# Patient Record
Sex: Female | Born: 1965 | Race: White | Hispanic: No | Smoking: Never smoker
Health system: Southern US, Community
[De-identification: ages and names within clinical notes are randomized; demographics above are authoritative.]

## PROBLEM LIST (undated history)

## (undated) DIAGNOSIS — R000 Tachycardia, unspecified: Secondary | ICD-10-CM

## (undated) DIAGNOSIS — R011 Cardiac murmur, unspecified: Secondary | ICD-10-CM

## (undated) DIAGNOSIS — S322XXA Fracture of coccyx, initial encounter for closed fracture: Secondary | ICD-10-CM

## (undated) DIAGNOSIS — C50511 Malignant neoplasm of lower-outer quadrant of right female breast: Secondary | ICD-10-CM

## (undated) DIAGNOSIS — S3210XA Unspecified fracture of sacrum, initial encounter for closed fracture: Secondary | ICD-10-CM

## (undated) HISTORY — PX: TUBAL LIGATION: SHX77

## (undated) HISTORY — PX: TONSILECTOMY/ADENOIDECTOMY WITH MYRINGOTOMY: SHX6125

## (undated) HISTORY — DX: Fracture of coccyx, initial encounter for closed fracture: S32.2XXA

## (undated) HISTORY — DX: Tachycardia, unspecified: R00.0

## (undated) HISTORY — DX: Cardiac murmur, unspecified: R01.1

## (undated) HISTORY — PX: APPENDECTOMY: SHX54

## (undated) HISTORY — DX: Malignant neoplasm of lower-outer quadrant of right female breast: C50.511

## (undated) HISTORY — PX: MASTECTOMY: SHX3

## (undated) HISTORY — DX: Unspecified fracture of sacrum, initial encounter for closed fracture: S32.10XA

---
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
## 1998-05-28 ENCOUNTER — Emergency Department (HOSPITAL_COMMUNITY): Admission: EM | Admit: 1998-05-28 | Discharge: 1998-05-28 | Payer: Self-pay | Admitting: Emergency Medicine

## 1999-12-02 ENCOUNTER — Emergency Department (HOSPITAL_COMMUNITY): Admission: EM | Admit: 1999-12-02 | Discharge: 1999-12-02 | Payer: Self-pay | Admitting: Emergency Medicine

## 1999-12-02 ENCOUNTER — Encounter: Payer: Self-pay | Admitting: Emergency Medicine

## 2002-04-11 ENCOUNTER — Encounter: Payer: Self-pay | Admitting: Emergency Medicine

## 2002-04-11 ENCOUNTER — Emergency Department (HOSPITAL_COMMUNITY): Admission: EM | Admit: 2002-04-11 | Discharge: 2002-04-11 | Payer: Self-pay | Admitting: Emergency Medicine

## 2004-10-26 ENCOUNTER — Emergency Department (HOSPITAL_COMMUNITY): Admission: EM | Admit: 2004-10-26 | Discharge: 2004-10-26 | Payer: Self-pay | Admitting: Family Medicine

## 2004-11-27 ENCOUNTER — Emergency Department (HOSPITAL_COMMUNITY): Admission: EM | Admit: 2004-11-27 | Discharge: 2004-11-27 | Payer: Self-pay | Admitting: Family Medicine

## 2006-01-16 ENCOUNTER — Emergency Department (HOSPITAL_COMMUNITY): Admission: EM | Admit: 2006-01-16 | Discharge: 2006-01-16 | Payer: Self-pay | Admitting: Emergency Medicine

## 2007-04-25 ENCOUNTER — Emergency Department (HOSPITAL_COMMUNITY): Admission: EM | Admit: 2007-04-25 | Discharge: 2007-04-25 | Payer: Self-pay | Admitting: Family Medicine

## 2007-08-11 ENCOUNTER — Emergency Department (HOSPITAL_COMMUNITY): Admission: EM | Admit: 2007-08-11 | Discharge: 2007-08-11 | Payer: Self-pay | Admitting: Emergency Medicine

## 2008-07-28 ENCOUNTER — Emergency Department (HOSPITAL_COMMUNITY): Admission: EM | Admit: 2008-07-28 | Discharge: 2008-07-28 | Payer: Self-pay | Admitting: Family Medicine

## 2010-11-15 ENCOUNTER — Emergency Department (HOSPITAL_COMMUNITY)
Admission: EM | Admit: 2010-11-15 | Discharge: 2010-11-15 | Payer: Self-pay | Source: Home / Self Care | Admitting: Family Medicine

## 2010-12-18 ENCOUNTER — Emergency Department (HOSPITAL_COMMUNITY)
Admission: EM | Admit: 2010-12-18 | Discharge: 2010-12-18 | Payer: Self-pay | Source: Home / Self Care | Admitting: Family Medicine

## 2011-06-13 ENCOUNTER — Encounter: Payer: Self-pay | Admitting: Family Medicine

## 2011-06-13 ENCOUNTER — Ambulatory Visit (INDEPENDENT_AMBULATORY_CARE_PROVIDER_SITE_OTHER): Payer: 59 | Admitting: Family Medicine

## 2011-06-13 VITALS — BP 130/92 | HR 95 | Temp 99.1°F | Ht 66.0 in | Wt 165.8 lb

## 2011-06-13 DIAGNOSIS — M76899 Other specified enthesopathies of unspecified lower limb, excluding foot: Secondary | ICD-10-CM

## 2011-06-13 DIAGNOSIS — G5700 Lesion of sciatic nerve, unspecified lower limb: Secondary | ICD-10-CM

## 2011-06-13 DIAGNOSIS — M6580 Other synovitis and tenosynovitis, unspecified site: Secondary | ICD-10-CM

## 2011-06-13 NOTE — Progress Notes (Signed)
April Orozco, a 45 y.o. 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 2 children at home. Was married for 20 years.  Murmur - palipitations.   Crushed tailbone at 18 Now with posterior buttocks pain mostly all 1 sided.  Pain in buttocks, some radiation down leg  Mild degree of L anterior hip pain at well, but none in the deep groin  No trauma  Patient Active Problem List  Diagnoses  . Fracture, sacrum/coccyx   Past Medical History  Diagnosis Date  . Heart murmur     SE Cardiovascular has evaluated, felt benign  . Tachycardia     Intermittent, resolved with decreased caffeine intake  . Fracture, sacrum/coccyx 1970    "Shattered tailbone"   Past Surgical History  Procedure Date  . Tubal ligation    History  Substance Use Topics  . Smoking status: Never Smoker   . Smokeless tobacco: Not on file  . Alcohol Use: Yes   No family history on file. Allergies  Allergen Reactions  . Banana    No current outpatient prescriptions on file prior to visit.   REVIEW OF SYSTEMS  GEN: No fevers, chills. Nontoxic. Primarily MSK c/o today. MSK: Detailed in the HPI GI: tolerating PO intake without difficulty Neuro: detailed above Otherwise the pertinent positives of the ROS are noted above.    Physical Exam  Blood pressure 130/92, pulse 95, temperature 99.1 F (37.3 C), temperature source Oral, height 5\' 6"  (1.676 m), weight 165 lb 12.8 oz (75.206 kg), SpO2 98.00%.  GEN: Well-developed,well-nourished,in no acute distress; alert,appropriate and cooperative throughout examination HEENT: Normocephalic and atraumatic without obvious abnormalities. Ears, externally no deformities PULM: Breathing comfortably in no respiratory distress EXT: No clubbing, cyanosis, or edema PSYCH: Normally interactive. Cooperative during the interview. Pleasant. Friendly  and conversant. Not anxious or depressed appearing. Normal, full affect.  HIP EXAM: SIDE: ROM: Abduction, Flexion, Internal and External range of motion: excellent Pain with terminal IROM and EROM: no GTB: NT SLR: NEG Knees: No effusion FABER: NT REVERSE FABER: TTP on L Piriformis: TTP at direct palpation Str: flexion: 5/5 abduction: 4+/5 adduction: 5/5 Strength testing,some pain with abductin  TTP anterior hip flexor insertion  Assessment and Plan: 1.  Hip flexor insertional tendinopathy. 2. Classic piriformis syndrome  Using Netter's Atlas of Orthopaedic Anatomy, I reviewed the anatomy associated with the patient's problem. Reviewed pathophysiology. The patient was given a handout from [facility] for rehabilitation of the following condition: Piriformis Syndrome and hip rehab  Also given a handout with more extensive Piriformis stretching, hip flexor and abductor strengthening, ham stretching Exam not c/w with spine  Rec deep massage, explained self-massage with ball

## 2011-06-13 NOTE — Patient Instructions (Addendum)
Sacroiliac Joint Mobilization and Rehab 1. Work on pretzel stretching, shoulder back and leg draped in front. 3-5 sets, 30 sec.. 2. hip abductor rotations. standing, hip flexion and rotation outward then inward. 3 sets, 15 reps. when can do comfortably, add ankle weights starting at 2 pounds.  3. cross over stretching - shoulder back to ground, same side leg crossover. 3-5 sets for 30 min..  4. rolling up and back knees to chest and rocking. Sink stretches during the day  Hip workout from [facility]  Balance exercises for hip  Particularly Hip abduction, adduction, flexion 2-3 sets of 30 each day   F/u Nov for CPX

## 2011-06-16 ENCOUNTER — Encounter: Payer: Self-pay | Admitting: Family Medicine

## 2011-06-16 DIAGNOSIS — S3210XA Unspecified fracture of sacrum, initial encounter for closed fracture: Secondary | ICD-10-CM | POA: Insufficient documentation

## 2011-10-10 ENCOUNTER — Other Ambulatory Visit: Payer: 59

## 2011-10-15 ENCOUNTER — Encounter: Payer: 59 | Admitting: Family Medicine

## 2011-10-15 DIAGNOSIS — Z0289 Encounter for other administrative examinations: Secondary | ICD-10-CM

## 2014-09-01 ENCOUNTER — Ambulatory Visit (INDEPENDENT_AMBULATORY_CARE_PROVIDER_SITE_OTHER): Payer: BC Managed Care – PPO | Admitting: Family Medicine

## 2014-09-01 ENCOUNTER — Ambulatory Visit (INDEPENDENT_AMBULATORY_CARE_PROVIDER_SITE_OTHER): Payer: BC Managed Care – PPO

## 2014-09-01 VITALS — BP 132/90 | HR 84 | Temp 98.2°F | Resp 18 | Ht 64.0 in | Wt 179.2 lb

## 2014-09-01 DIAGNOSIS — J209 Acute bronchitis, unspecified: Secondary | ICD-10-CM

## 2014-09-01 DIAGNOSIS — R062 Wheezing: Secondary | ICD-10-CM

## 2014-09-01 DIAGNOSIS — R059 Cough, unspecified: Secondary | ICD-10-CM

## 2014-09-01 DIAGNOSIS — R05 Cough: Secondary | ICD-10-CM

## 2014-09-01 DIAGNOSIS — J018 Other acute sinusitis: Secondary | ICD-10-CM

## 2014-09-01 MED ORDER — HYDROCODONE-HOMATROPINE 5-1.5 MG/5ML PO SYRP: 5.0000 mL | ORAL_SOLUTION | Freq: Every evening | ORAL | Status: DC | PRN

## 2014-09-01 MED ORDER — IPRATROPIUM BROMIDE 0.02 % IN SOLN
0.5000 mg | Freq: Once | RESPIRATORY_TRACT | Status: AC
  Administered 2014-09-01: 0.5 mg via RESPIRATORY_TRACT

## 2014-09-01 MED ORDER — ALBUTEROL SULFATE (2.5 MG/3ML) 0.083% IN NEBU
2.5000 mg | INHALATION_SOLUTION | Freq: Once | RESPIRATORY_TRACT | Status: AC
  Administered 2014-09-01: 2.5 mg via RESPIRATORY_TRACT

## 2014-09-01 MED ORDER — ALBUTEROL SULFATE HFA 108 (90 BASE) MCG/ACT IN AERS: 2.0000 | INHALATION_SPRAY | Freq: Four times a day (QID) | RESPIRATORY_TRACT | Status: DC | PRN

## 2014-09-01 MED ORDER — AZITHROMYCIN 250 MG PO TABS: ORAL_TABLET | ORAL | Status: DC

## 2014-09-01 MED ORDER — BENZONATATE 100 MG PO CAPS: 200.0000 mg | ORAL_CAPSULE | Freq: Two times a day (BID) | ORAL | Status: DC | PRN

## 2014-09-01 NOTE — Patient Instructions (Signed)

## 2014-09-01 NOTE — Progress Notes (Signed)
Chief Complaint:  Chief Complaint  Patient presents with  . Flank Pain    since last Friday--left side hurt--hard to breathe  . Cough    productive--yellowish/green  . Nasal Congestion  . Fever    earlier at 101    HPI: April Orozco is a 48 y.o. female who is here for sinus xs and nasal congestion x 7 days. She is an occupational Programmer, applications and starting last week she gave about 500  Flu shots and must have been on contact with some sick person, she did not get one for herself ye. She has had fevers, productive yellowish green cough, nasal congestion, wheezing, and has been taking otc meds without relief. 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  [place] [employer], she does occupational health, she used to work for [employer] in the [place]  Past Medical History  Diagnosis Date  . Heart murmur     SE Cardiovascular has evaluated, felt benign  . Tachycardia     Intermittent, resolved with decreased caffeine intake  . Fracture, sacrum/coccyx 1970    "Shattered tailbone"   Past Surgical History  Procedure Laterality Date  . Tubal ligation     History   Social History  . Marital Status: Widowed    Spouse Name: N/A    Number of Children: 7  . Years of Education: N/A   Occupational History  . 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 Smoking status: Never Smoker   . Smokeless tobacco: None  . Alcohol Use: Yes  . Drug Use: None  . Sexual Activity: None   Other Topics Concern  . None   Social History Narrative  . None   History reviewed. No pertinent family history. Allergies  Allergen Reactions  . Banana   . Sulfur Hives   Prior to Admission medications   Not on File     ROS: The patient denies night sweats, unintentional weight loss, chest pain, palpitations,  nausea, vomiting, abdominal pain, dysuria,  hematuria, melena, numbness, weakness, or tingling.   All other systems have been reviewed and were otherwise negative with the exception of those mentioned in the HPI and as above.    PHYSICAL EXAM: Filed Vitals:   09/01/14 1342  BP: 132/90  Pulse: 84  Temp: 98.2 F (36.8 C)  Resp: 18  spo2 97% Filed Vitals:   09/01/14 1342  Height: 5\' 4"  (1.626 m)  Weight: 179 lb 3.2 oz (81.285 kg)   Body mass index is 30.74 kg/(m^2).  General: Alert, no acute distress HEENT:  Normocephalic, atraumatic, oropharynx patent. EOMI, PERRLA, + nasal dc , erythematous boggy nares, +/- sinus pressure Cardiovascular:  Regular rate and rhythm, no rubs murmurs or gallops.  No Carotid bruits, radial pulse intact. No pedal edema.  Respiratory: + No wheezes, No rales, or rhonchi.  No cyanosis, no use of accessory musculature GI: No organomegaly, abdomen is soft and non-tender, positive bowel sounds.  No masses. Skin: No rashes. Neurologic: Facial musculature symmetric. Psychiatric: Patient is appropriate throughout our interaction. Lymphatic: No cervical lymphadenopathy Musculoskeletal: Gait intact.   LABS: No results found for this or any previous visit.   EKG/XRAY:   Primary read interpreted by Dr. Marin Comment at [facility].s + bronchitic change No pneumo, effusion, obvious  iinfiltrates   ASSESSMENT/PLAN: Encounter Diagnoses  Name Primary?  . Cough   . Wheezing   . Other acute sinusitis   . Acute bronchitis, unspecified organism Yes   Felt better after and improved airflow , So2 98%  After nebs x 1 Z pack, albuterol, tessalon and hycodan F/u prn  Gross sideeffects, risk and benefits, and alternatives of medications d/w patient. Patient is aware that all medications have potential sideeffects and we are unable to predict every sideeffect or drug-drug interaction that may occur.  Quandra Fedorchak, [place], DO 09/01/2014 3:08 PM

## 2015-08-25 ENCOUNTER — Emergency Department (INDEPENDENT_AMBULATORY_CARE_PROVIDER_SITE_OTHER)
Admission: EM | Admit: 2015-08-25 | Discharge: 2015-08-25 | Disposition: A | Discharge status: Home / Self Care | Payer: BLUE CROSS/BLUE SHIELD | Source: Home / Self Care | Attending: Family Medicine | Admitting: Family Medicine

## 2015-08-25 ENCOUNTER — Encounter (HOSPITAL_COMMUNITY): Payer: Self-pay

## 2015-08-25 ENCOUNTER — Other Ambulatory Visit (HOSPITAL_COMMUNITY)
Admission: RE | Admit: 2015-08-25 | Discharge: 2015-08-25 | Disposition: A | Discharge status: Home / Self Care | Payer: BLUE CROSS/BLUE SHIELD | Source: Ambulatory Visit | Attending: Family Medicine | Admitting: Family Medicine

## 2015-08-25 DIAGNOSIS — R109 Unspecified abdominal pain: Secondary | ICD-10-CM

## 2015-08-25 LAB — URINALYSIS, ROUTINE W REFLEX MICROSCOPIC
BILIRUBIN URINE: NEGATIVE
Glucose, UA: NEGATIVE mg/dL
HGB URINE DIPSTICK: NEGATIVE
KETONES UR: NEGATIVE mg/dL
Leukocytes, UA: NEGATIVE
NITRITE: NEGATIVE
PH: 5 (ref 5.0–8.0)
Protein, ur: NEGATIVE mg/dL
Specific Gravity, Urine: 1.006 (ref 1.005–1.030)
UROBILINOGEN UA: 0.2 mg/dL (ref 0.0–1.0)

## 2015-08-25 LAB — POCT URINALYSIS DIP (DEVICE)
BILIRUBIN URINE: NEGATIVE
Glucose, UA: NEGATIVE mg/dL
Ketones, ur: NEGATIVE mg/dL
LEUKOCYTES UA: NEGATIVE
NITRITE: NEGATIVE
Protein, ur: NEGATIVE mg/dL
Specific Gravity, Urine: <=1.005 (ref 1.005–1.030)
Urobilinogen, UA: 0.2 mg/dL (ref 0.0–1.0)
pH: 5.5 (ref 5.0–8.0)

## 2015-08-25 LAB — POCT PREGNANCY, URINE: PREG TEST UR: NEGATIVE

## 2015-08-25 MED ORDER — KETOROLAC TROMETHAMINE 60 MG/2ML IM SOLN
INTRAMUSCULAR | Status: AC
  Filled 2015-08-25: qty 2

## 2015-08-25 MED ORDER — TAMSULOSIN HCL 0.4 MG PO CAPS: 0.4000 mg | ORAL_CAPSULE | Freq: Every day | ORAL | Status: DC

## 2015-08-25 MED ORDER — KETOROLAC TROMETHAMINE 60 MG/2ML IM SOLN
60.0000 mg | Freq: Once | INTRAMUSCULAR | Status: AC
  Administered 2015-08-25: 60 mg via INTRAMUSCULAR

## 2015-08-25 MED ORDER — TRAMADOL HCL 50 MG PO TABS: 50.0000 mg | ORAL_TABLET | Freq: Four times a day (QID) | ORAL | Status: DC | PRN

## 2015-08-25 NOTE — ED Notes (Signed)
C/o pain left lower pelvic area x couple of days. Menses 4th day, gradually heavier past few yrs. Pain kept her from sleeping pain comes and goes, stabbing type. No pain w percussion CVJ, NAD

## 2015-08-25 NOTE — Discharge Instructions (Signed)
The cause of your flank pain does not be clear but likely due to a kidney stone versus some other GYN etiology for constipation. Please remember to start using her Colace or other agent to help improve her bowel movements. Your given a dose of Toradol in our clinic to help with inflammation and pain. He may restart NSAIDs at home after 24 hours. Please start using the Flomax to help open the ureter and urethra's canal past the stone. He may use the tramadol for additional pain relief. Please go to the emergency room if you get significantly worse.

## 2015-08-25 NOTE — ED Notes (Signed)
Sent home w urine strainer. Call back number for lab issues verified

## 2015-08-25 NOTE — ED Provider Notes (Signed)
CSN: [id number]     Arrival date & time 08/25/15  1436 History   First MD Initiated Contact with Patient 08/25/15 1511     Chief Complaint  Patient presents with  . Pelvic Pain   (Consider location/radiation/quality/duration/timing/severity/associated sxs/prior Treatment) HPI   L mid abdominal pain. 3 days. Sharp. Comes adn goes. Episodes last a few minutes. Pain kept pt up all through the night. Non-radiating. Tylenol and ibuprofen w/o benefit. Heating pad w/ minimal improvement. No change in oral intake.  Denies fevers, nausea, vomiting, lower pelvic pain, vaginal discharge, rash, dysuria, frequency. Still on menstrual period.  QOD BMs  Past Medical History  Diagnosis Date  . Heart murmur     SE Cardiovascular has evaluated, felt benign  . Tachycardia     Intermittent, resolved with decreased caffeine intake  . Fracture, sacrum/coccyx 1970    "Shattered tailbone"   Past Surgical History  Procedure Laterality Date  . Tubal ligation     Family History  Problem Relation Age of Onset  . Diabetes Mother   . Diabetes Father   . Hypertension Other    Social History  Substance Use Topics  . Smoking status: Never Smoker   . Smokeless tobacco: None  . Alcohol Use: Yes   OB History    No data available     Review of Systems Per HPI with all other pertinent systems negative.   Allergies  Banana and Sulfur  Home Medications   Prior to Admission medications   Medication Sig Start Date End Date Taking? Authorizing Provider  albuterol (PROVENTIL HFA;VENTOLIN HFA) 108 (90 BASE) MCG/ACT inhaler Inhale 2 puffs into the lungs every 6 (six) hours as needed for wheezing or shortness of breath. 09/01/14   Thao P Le, DO  tamsulosin (FLOMAX) 0.4 MG CAPS capsule Take 1 capsule (0.4 mg total) by mouth daily. 08/25/15   Waldemar Dickens, MD  traMADol (ULTRAM) 50 MG tablet Take 1 tablet (50 mg total) by mouth every 6 (six) hours as needed. 08/25/15   Waldemar Dickens, MD   Meds Ordered and  Administered this Visit   Medications  ketorolac (TORADOL) injection 60 mg (60 mg Intramuscular Given 08/25/15 1606)    BP 119/77 mmHg  Pulse 75  Temp(Src) 97.9 F (36.6 C) (Oral)  Resp 16  SpO2 98% No data found.   Physical Exam  ED Course  Procedures (including critical care time)  Labs Review Labs Reviewed  POCT URINALYSIS DIP (DEVICE) - Abnormal; Notable for the following:    Hgb urine dipstick TRACE (*)    All other components within normal limits  URINE CULTURE  URINALYSIS, ROUTINE W REFLEX MICROSCOPIC (NOT AT [facility])  POCT PREGNANCY, URINE    Imaging Review No results found.   Visual Acuity Review  Right Eye Distance:   Left Eye Distance:   Bilateral Distance:    Right Eye Near:   Left Eye Near:    Bilateral Near:         MDM   1. Flank pain    Constipation versus nephrolithiasis: Toradol 60 mg IM given in clinic, strain urine, continue NSAIDs and 24 hours, Flomax. Urinalysis sent for culture showing no UTI. Use Colace for improvement of bowel movements  Waldemar Dickens, MD 08/29/15 2108

## 2015-08-27 LAB — URINE CULTURE

## 2015-08-28 NOTE — ED Notes (Signed)
Final report of urine culture negative

## 2016-03-11 ENCOUNTER — Encounter (HOSPITAL_COMMUNITY): Payer: Self-pay | Admitting: Emergency Medicine

## 2016-03-11 ENCOUNTER — Ambulatory Visit (HOSPITAL_COMMUNITY)
Admission: EM | Admit: 2016-03-11 | Discharge: 2016-03-11 | Disposition: A | Discharge status: Home / Self Care | Payer: BLUE CROSS/BLUE SHIELD | Attending: Family Medicine | Admitting: Family Medicine

## 2016-03-11 DIAGNOSIS — J0100 Acute maxillary sinusitis, unspecified: Secondary | ICD-10-CM | POA: Diagnosis not present

## 2016-03-11 MED ORDER — AMOXICILLIN 500 MG PO CAPS: 1000.0000 mg | ORAL_CAPSULE | Freq: Two times a day (BID) | ORAL | Status: DC

## 2016-03-11 NOTE — ED Notes (Signed)
Patient reports sinus issues, coughing and sneezing

## 2016-03-11 NOTE — ED Provider Notes (Signed)
CSN: [id number]     Arrival date & time 03/11/16  1901 History   First MD Initiated Contact with Patient 03/11/16 2014     Chief Complaint  Patient presents with  . URI   (Consider location/radiation/quality/duration/timing/severity/associated sxs/prior Treatment) HPI Comments: 50 year old female complaining of facial pain and swelling associated with cough, PND and sniffles. She states that she had green discharge running from her nose today. Her temperature was 101 at home.   Past Medical History  Diagnosis Date  . Heart murmur     SE Cardiovascular has evaluated, felt benign  . Tachycardia     Intermittent, resolved with decreased caffeine intake  . Fracture, sacrum/coccyx ([place]) 1970    "Shattered tailbone"   Past Surgical History  Procedure Laterality Date  . Tubal ligation     Family History  Problem Relation Age of Onset  . Diabetes Mother   . Diabetes Father   . Hypertension Other    Social History  Substance Use Topics  . Smoking status: Never Smoker   . Smokeless tobacco: None  . Alcohol Use: Yes   OB History    No data available     Review of Systems  Constitutional: Positive for fever. Negative for chills, activity change, appetite change and fatigue.  HENT: Positive for congestion, postnasal drip and rhinorrhea. Negative for ear discharge, ear pain and facial swelling.   Eyes: Negative.   Respiratory: Negative.   Cardiovascular: Negative.   Musculoskeletal: Negative for neck pain and neck stiffness.  Skin: Negative for pallor and rash.  Neurological: Negative.   All other systems reviewed and are negative.   Allergies  Banana and Sulfur  Home Medications   Prior to Admission medications   Medication Sig Start Date End Date Taking? Authorizing Provider  albuterol (PROVENTIL HFA;VENTOLIN HFA) 108 (90 BASE) MCG/ACT inhaler Inhale 2 puffs into the lungs every 6 (six) hours as needed for wheezing or shortness of breath. 09/01/14   Thao P Le, DO   amoxicillin (AMOXIL) 500 MG capsule Take 2 capsules (1,000 mg total) by mouth 2 (two) times daily. 03/11/16   Janne Napoleon, NP  tamsulosin (FLOMAX) 0.4 MG CAPS capsule Take 1 capsule (0.4 mg total) by mouth daily. 08/25/15   Waldemar Dickens, MD  traMADol (ULTRAM) 50 MG tablet Take 1 tablet (50 mg total) by mouth every 6 (six) hours as needed. 08/25/15   Waldemar Dickens, MD   Meds Ordered and Administered this Visit  Medications - No data to display  BP 138/76 mmHg  Pulse 83  Temp(Src) 98.1 F (36.7 C)  Resp 16  SpO2 98% No data found.   Physical Exam  Constitutional: She is oriented to person, place, and time. She appears well-developed and well-nourished. No distress.  HENT:  Bilateral TMs are normal. Oropharynx with minor erythema, moderate amount of frothy clear PND and cobblestoning.  Eyes: Conjunctivae and EOM are normal.  Neck: Normal range of motion. Neck supple.  Cardiovascular: Normal rate, regular rhythm and normal heart sounds.   Pulmonary/Chest: Effort normal and breath sounds normal. No respiratory distress.  Lymphadenopathy:    She has no cervical adenopathy.  Neurological: She is alert and oriented to person, place, and time. She exhibits normal muscle tone.  Skin: Skin is warm.  Nursing note and vitals reviewed.   ED Course  Procedures (including critical care time)  Labs Review Labs Reviewed - No data to display  Imaging Review No results found.   Visual Acuity Review  Right  Eye Distance:   Left Eye Distance:   Bilateral Distance:    Right Eye Near:   Left Eye Near:    Bilateral Near:         MDM   1. Acute maxillary sinusitis, recurrence not specified    Sinusitis, Adult For nasal and head congestion may take Sudafed PE 10 mg every 4 hours as needed. Saline nasal spray used frequently. For drainage may use Allegra, Claritin or Zyrtec. If you need stronger medicine to stop drainage may take Chlor-Trimeton 2-4 mg every 4 hours. This may  cause drowsiness. Flonase or Rhinocort nasal spray as directed. Ibuprofen 600 mg every 6 hours as needed for pain, discomfort or fever. Drink plenty of fluids and stay well-hydrated. If you are not feeling better in 48-72 hours on antibiotics this is likely not a bacterial infection and will have to treat with the above medications for symptom relief.    Janne Napoleon, NP 03/11/16 2031

## 2016-03-11 NOTE — Discharge Instructions (Signed)
Sinusitis, Adult For nasal and head congestion may take Sudafed PE 10 mg every 4 hours as needed. Saline nasal spray used frequently. For drainage may use Allegra, Claritin or Zyrtec. If you need stronger medicine to stop drainage may take Chlor-Trimeton 2-4 mg every 4 hours. This may cause drowsiness. Flonase or Rhinocort nasal spray as directed. Ibuprofen 600 mg every 6 hours as needed for pain, discomfort or fever. Drink plenty of fluids and stay well-hydrated. If you are not feeling better in 48-72 hours on antibiotics this is likely not a bacterial infection and will have to treat with the above medications for symptom relief.  Sinusitis is redness, soreness, and inflammation of the paranasal sinuses. Paranasal sinuses are air pockets within the bones of your face. They are located beneath your eyes, in the middle of your forehead, and above your eyes. In healthy paranasal sinuses, mucus is able to drain out, and air is able to circulate through them by way of your nose. However, when your paranasal sinuses are inflamed, mucus and air can become trapped. This can allow bacteria and other germs to grow and cause infection. Sinusitis can develop quickly and last only a short time (acute) or continue over a long period (chronic). Sinusitis that lasts for more than 12 weeks is considered chronic. CAUSES Causes of sinusitis include:  Allergies.  Structural abnormalities, such as displacement of the cartilage that separates your nostrils (deviated septum), which can decrease the air flow through your nose and sinuses and affect sinus drainage.  Functional abnormalities, such as when the small hairs (cilia) that line your sinuses and help remove mucus do not work properly or are not present. SIGNS AND SYMPTOMS Symptoms of acute and chronic sinusitis are the same. The primary symptoms are pain and pressure around the affected sinuses. Other symptoms include:  Upper  toothache.  Earache.  Headache.  Bad breath.  Decreased sense of smell and taste.  A cough, which worsens when you are lying flat.  Fatigue.  Fever.  Thick drainage from your nose, which often is green and may contain pus (purulent).  Swelling and warmth over the affected sinuses. DIAGNOSIS Your health care provider will perform a physical exam. During your exam, your health care provider may perform any of the following to help determine if you have acute sinusitis or chronic sinusitis:  Look in your nose for signs of abnormal growths in your nostrils (nasal polyps).  Tap over the affected sinus to check for signs of infection.  View the inside of your sinuses using an imaging device that has a light attached (endoscope). If your health care provider suspects that you have chronic sinusitis, one or more of the following tests may be recommended:  Allergy tests.  Nasal culture. A sample of mucus is taken from your nose, sent to a lab, and screened for bacteria.  Nasal cytology. A sample of mucus is taken from your nose and examined by your health care provider to determine if your sinusitis is related to an allergy. TREATMENT Most cases of acute sinusitis are related to a viral infection and will resolve on their own within 10 days. Sometimes, medicines are prescribed to help relieve symptoms of both acute and chronic sinusitis. These may include pain medicines, decongestants, nasal steroid sprays, or saline sprays. However, for sinusitis related to a bacterial infection, your health care provider will prescribe antibiotic medicines. These are medicines that will help kill the bacteria causing the infection. Rarely, sinusitis is caused by a fungal  infection. In these cases, your health care provider will prescribe antifungal medicine. For some cases of chronic sinusitis, surgery is needed. Generally, these are cases in which sinusitis recurs more than 3 times per year, despite  other treatments. HOME CARE INSTRUCTIONS  Drink plenty of water. Water helps thin the mucus so your sinuses can drain more easily.  Use a humidifier.  Inhale steam 3-4 times a day (for example, sit in the bathroom with the shower running).  Apply a warm, moist washcloth to your face 3-4 times a day, or as directed by your health care provider.  Use saline nasal sprays to help moisten and clean your sinuses.  Take medicines only as directed by your health care provider.  If you were prescribed either an antibiotic or antifungal medicine, finish it all even if you start to feel better. SEEK IMMEDIATE MEDICAL CARE IF:  You have increasing pain or severe headaches.  You have nausea, vomiting, or drowsiness.  You have swelling around your face.  You have vision problems.  You have a stiff neck.  You have difficulty breathing.   This information is not intended to replace advice given to you by your health care provider. Make sure you discuss any questions you have with your health care provider.   Document Released: 11/11/2005 Document Revised: 12/02/2014 Document Reviewed: 11/26/2011 Elsevier Interactive Patient Education 2016 Elsevier Inc.  Sinus Rinse WHAT IS A SINUS RINSE? A sinus rinse is a home treatment. It rinses your sinuses with a mixture of salt and water (saline solution). Sinuses are air-filled spaces in your skull behind the bones of your face and forehead. They open into your nasal cavity. To do a sinus rinse, you will need:  Saline solution.  Neti pot or spray bottle. This releases the saline solution into your nose and through your sinuses. You can buy neti pots and spray bottles at:  Your local pharmacy.  A health food store.  Online. WHEN WOULD I DO A SINUS RINSE?  A sinus rinse can help to clear your nasal cavity. It can clear:   Mucus.  Dirt.  Dust.  Pollen. You may do a sinus rinse when you have:  A cold.  A virus.  Allergies.  A  sinus infection.  A stuffy nose. If you are considering a sinus rinse:  Ask your child's doctor before doing a sinus rinse on your child.  Do not do a sinus rinse if you have had:  Ear or nasal surgery.  An ear infection.  Blocked ears. HOW DO I DO A SINUS RINSE?   Wash your hands.  Disinfect your device using the directions that came with the device.  Dry your device.  Use the solution that comes with your device or one that is sold separately in stores. Follow the mixing directions on the package.  Fill your device with the amount of saline solution as stated in the device instructions.  Stand over a sink and tilt your head sideways over the sink.  Place the spout of the device in your upper nostril (the one closer to the ceiling).  Gently pour or squeeze the saline solution into the nasal cavity. The liquid should drain to the lower nostril if you are not too congested.  Gently blow your nose. Blowing too hard may cause ear pain.  Repeat in the other nostril.  Clean and rinse your device with clean water.  Air-dry your device. ARE THERE RISKS OF A SINUS RINSE?  Sinus rinse is  normally very safe and helpful. However, there are a few risks, which include:   A burning feeling in the sinuses. This may happen if you do not make the saline solution as instructed. Make sure to follow all directions when making the saline solution.  Infection from unclean water. This is rare, but possible.  Nasal irritation.   This information is not intended to replace advice given to you by your health care provider. Make sure you discuss any questions you have with your health care provider.   Document Released: 06/08/2014 Document Reviewed: 06/08/2014 Elsevier Interactive Patient Education [employer].

## 2016-09-12 ENCOUNTER — Other Ambulatory Visit: Payer: Self-pay | Admitting: Radiology

## 2016-09-13 ENCOUNTER — Telehealth: Payer: Self-pay | Admitting: *Deleted

## 2016-09-13 ENCOUNTER — Encounter: Payer: Self-pay | Admitting: *Deleted

## 2016-09-13 DIAGNOSIS — C50511 Malignant neoplasm of lower-outer quadrant of right female breast: Secondary | ICD-10-CM

## 2016-09-13 HISTORY — DX: Malignant neoplasm of lower-outer quadrant of right female breast: C50.511

## 2016-09-13 NOTE — Telephone Encounter (Signed)
Confirmed [facility] for 09/18/16 at 1215 .  Instructions and contact information given.

## 2016-09-13 NOTE — Telephone Encounter (Signed)
Left vm for return call to discuss [facility] for 09/18/16

## 2016-09-17 NOTE — Progress Notes (Signed)
On for the rest [facility]  Telephone:([phone]) [phone] [phone]  Clinic New Consult Note   Patient Care Team: No Pcp Per Patient as PCP - General (General Practice) Rolm Bookbinder, MD as Consulting Physician (General Surgery) Truitt Merle, MD as Consulting Physician (Hematology) Kyung Rudd, MD as Consulting Physician (Radiation Oncology) 09/18/2016  CHIEF COMPLAINTS/PURPOSE OF CONSULTATION:  Newly diagnosed right breast cancer  Oncology History   Malignant neoplasm of lower-outer quadrant of right female breast [facility])   Staging form: Breast, AJCC 7th Edition   - Clinical stage from 09/12/2016: Stage IIB (T2, N1, M0) - Signed by Truitt Merle, MD on 09/18/2016      Malignant neoplasm of lower-outer quadrant of right female breast ([place])   09/10/2016 Mammogram    Mammogram and ultrasound showed a 2.7 cm mass at 8 clock position of the right breast, there is also a lobulated lymph nodes in the right axilla, slightly suspicious for malignancy.       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 female is here because of her recently diagnosed right breast cancer. She is accompanied by her boyfriend to our multidisciplinary breast clinic today.   She noticed a right breast lump 3 monthsa ago, no pain or tenderness, no skin change or nipple discharge. She feels the lump has been getting bigger over the past 3 months. She called her gynecologist and was finally seen a few weeks ago. She was referred to have a diagnostic mammogram  and ultrasound which showed a 2.7 cm mass at the 8:00 position of the right breast, and also a lobulated lymph nodes in the right axilla. 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  She feels well, denies any skin pain, or other symptoms. No recent weight loss. 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 Her husband died from small cell cancer several years ago, she has 5 daughters. She recently found out that 3 of her paternal cousins had breast cancer.  GYN HISTORY  Menarchal: 17 LMP: regular  Contraceptive: no  HRT: n/a  G5P5: 5 daughters 30-19    MEDICAL HISTORY:  Past Medical History:  Diagnosis Date  . Fracture, sacrum/coccyx ([place]) 1970   "Shattered tailbone"  . Heart murmur    [facility]ovascular has evaluated, felt benign  . Malignant neoplasm of lower-outer quadrant of right female breast ([place]) 09/13/2016  . Tachycardia    Intermittent, resolved with decreased caffeine intake    SURGICAL HISTORY: Past Surgical History:  Procedure Laterality Date  . APPENDECTOMY    . TONSILECTOMY/ADENOIDECTOMY WITH MYRINGOTOMY    . TUBAL LIGATION      SOCIAL HISTORY: Social History   Social History  . Marital status: Widowed    Spouse name: N/A  . Number of children: 5  . Years of education: N/A   Occupational History  .  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 Smoking status: Never Smoker  . Smokeless tobacco: Never Used  . Alcohol use Yes     Comment: social drinker   . Drug use: No  . Sexual activity: Not on file   Other Topics Concern  . Not on file   Social History Narrative  . No narrative on file    FAMILY HISTORY: Family History  Problem Relation Age of Onset  . Diabetes Mother   . Diabetes Father   . Hypertension Other   . Cancer Cousin 42      breast cancer   . Cancer Cousin 46    breast cancer   . Cancer Cousin 48    breast cancer     ALLERGIES:  is allergic to banana and sulfur.  MEDICATIONS:  Current Outpatient Prescriptions  Medication Sig Dispense Refill  . zolpidem (AMBIEN) 5 MG tablet Take 1 tablet (5 mg total) by mouth at bedtime as needed for sleep. 15 tablet 0   No current facility-administered medications for this visit.     REVIEW OF SYSTEMS:   Constitutional: Denies fevers, chills or abnormal night sweats Eyes: Denies blurriness of vision, double vision or watery eyes Ears, nose, mouth, throat, and face: Denies mucositis or sore throat Respiratory: Denies cough, dyspnea or wheezes Cardiovascular: Denies palpitation, chest discomfort or lower extremity swelling Gastrointestinal:  Denies nausea, heartburn or change in bowel habits Skin: Denies abnormal skin rashes Lymphatics: Denies new lymphadenopathy or easy bruising Neurological:Denies numbness, tingling or new weaknesses Behavioral/Psych: Mood is stable, no new changes  All other systems were reviewed with the patient and are negative.  PHYSICAL EXAMINATION: ECOG PERFORMANCE STATUS: 0 - Asymptomatic  Vitals:   09/18/16 1307  BP: (!) 120/49  Pulse: 82  Resp: 18  Temp: 98.5 F (36.9 C)   Filed Weights   09/18/16 1307  Weight: 182 lb 11.2 oz (82.9 kg)    GENERAL:alert, no distress and comfortable SKIN: skin color, texture, turgor are normal, no rashes or significant lesions EYES: normal, conjunctiva are pink and non-injected, sclera clear OROPHARYNX:no exudate, no erythema and lips, buccal mucosa, and tongue normal  NECK: supple, thyroid normal size, non-tender, without nodularity LYMPH:  no palpable lymphadenopathy in the cervical, axillary or inguinal LUNGS: clear to auscultation and percussion with normal breathing effort HEART: regular rate & rhythm and no murmurs and no lower extremity edema ABDOMEN:abdomen soft, non-tender and  normal bowel sounds Musculoskeletal:no cyanosis of digits and no clubbing  PSYCH: alert & oriented x 3 with fluent speech NEURO: no focal motor/sensory deficits  LABORATORY DATA:  I have reviewed the data as listed CBC Latest Ref Rng & Units 09/18/2016  WBC 3.9 - 10.3 10e3/uL 7.7  Hemoglobin 11.6 - 15.9 g/dL 15.2  Hematocrit 34.8 - 46.6 % 45.0  Platelets 145 - 400 10e3/uL 222    _0 @  PATHOLOGY REPORTS: Diagnosis 09/12/2016 1. Lymph node, needle/core biopsy, right axilla - METASTATIC CARCINOMA WITH EXTRACAPSULAR EXTENSION. 2. Breast, right, needle core biopsy, 8 o'clock - INVASIVE DUCTAL CARCINOMA, GRADE 3.  Microscopic Comment 2. The findings are consistent with grade 3 ductal carcinoma. Breast prognostic profile will be performed.  1. PROGNOSTIC INDICATORS Results: IMMUNOHISTOCHEMICAL AND MORPHOMETRIC ANALYSIS PERFORMED MANUALLY Estrogen Receptor: 100%, POSITIVE, STRONG STAINING INTENSITY Progesterone Receptor: 100%, POSITIVE, STRONG STAINING INTENSITY Proliferation Marker Ki67: 15%  Results: HER2 - NEGATIVE RATIO OF HER2/CEP17 SIGNALS 1.52 AVERAGE HER2 COPY NUMBER PER CELL 2.35  2.  PROGNOSTIC INDICATORS Results: IMMUNOHISTOCHEMICAL AND MORPHOMETRIC ANALYSIS PERFORMED MANUALLY Estrogen Receptor: 90%, POSITIVE, STRONG STAINING INTENSITY Progesterone Receptor: 90%,  Proliferation Marker Ki67: 70%  Results: HER2 - NEGATIVE RATIO OF HER2/CEP17 SIGNALS 1.09 AVERAGE HER2 COPY NUMBER PER CELL 1.90  RADIOGRAPHIC STUDIES: I have personally reviewed the radiological images as listed and agreed with the findings in the report. No results found.  ASSESSMENT & PLAN:  50 year old premenopausal Caucasian female, presented with a palpable right breast mass.  1. 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 -Due to the clinically rapid growing tumor, moderate size, positive lymph nodes, high-grade, high Ki-67, this is likely a aggressive tumor, the risk of recurrence after surgery is likely high. -I recommend her to consider neoadjuvant chemotherapy, to shrink the tumor, make the lumpectomy more feasible, and potentially avoid axillary lymph node dissection. -She was seen by breast surgeon Dr. Donne Hazel today, who prefers her to have neoadjuvant chemotherapy, followed by lumpectomy and sentinel lymph node biopsy. -Giving the ER/PR strongly positive, HER-2 negative disease, I recommend her to have dose dense Adriamycin and Cytoxan, every 2 weeks for 4 cycles, followed by weekly Taxol for 12 weeks, as neoadjuvant therapy. --Chemotherapy consent: Side effects including but does not not limited to, fatigue, nausea, vomiting, diarrhea, hair loss, neuropathy, fluid retention, renal and kidney dysfunction, neutropenic fever, needed for blood transfusion, bleeding, heart failure, secondary leukemia and MDS, were discussed with patient in great detail. She is very concerned of cancer recurrence after surgery, and is strongly agrees with chemotherapy. ---Giving her strongly ER and PR positivity of the tumor cells, and her pre-menopause status, I recommend adjuvant endocrine therapy with tamoxifen. The potential side effects, which includes but not limited to, hot flash, skin and vaginal dryness, slightly increased risk of cardiovascular disease and cataract, small risk of thrombosis and endometrial cancer, were discussed with her in great details. Preventive strategies for thrombosis, such as being physically active, using compression stocks, avoid cigarette smoking, etc., were reviewed with her. I also recommend her to follow-up with her gynecologist once a year, and  watch for vaginal spotting or bleeding, as a clinically sign of endometrial cancer, etc. She voiced good understanding, and agrees to proceed. Will start after she completes adjuvant breast radiation. -She would benefit from adjuvant breast and axilla radiation also after lumpectomy, she was seen by Dr. Lisbeth Renshaw today.   2. Genetics  -Her young age and strong family history of breast cancer, we'll refer her to his genetics for testing, to rule out inheritable genetic syndrome.  3. Anxiety  -She has been quite anxious since cancer diagnosis, but does not feel she needs medication -I encouraged her to be positive -She has good social support  Plan  -will proceed neoadjuvant chemotherapy with AC-T, starting next Friday -port placement -echo and cardiac consult -chemo class -breast MRI w wo contrast  -genetic referral  -I will see her before chemo       Orders Placed This Encounter  Procedures  . MR BREAST BILATERAL W WO CONTRAST    Standing Status:   Future    Standing Expiration Date:   11/18/2017    Order Specific Question:   If indicated for the ordered procedure, I authorize  the administration of contrast media per Radiology protocol    Answer:   Yes    Order Specific Question:   Reason for Exam (SYMPTOM  OR DIAGNOSIS REQUIRED)    Answer:   new breast cancer/neo adjuvant chemo    Order Specific Question:   Preferred imaging location?    Answer:   [facility] (table limit-550lbs)    Order Specific Question:   What is the patient's sedation requirement?    Answer:   No Sedation    Order Specific Question:   Does the patient have a pacemaker or implanted devices?    Answer:   No  . ECHOCARDIOGRAM COMPLETE    Standing Status:   Future    Standing Expiration Date:   12/19/2017    Order Specific Question:   Where should this test be performed    Answer:       Order Specific Question:   Complete or Limited study?    Answer:   Complete    Order Specific Question:    Does the patient have a known history of hypersensitivity to Perflutren (aka Scientist, research (medical) for echocardiograms - CHECK ALLERGIES)    Answer:   No    Order Specific Question:   ADMINISTER PERFLUTERN    Answer:   ADMINISTER PERFLUTREN    Order Specific Question:   Expected Date:    Answer:   1 week    Order Specific Question:   Reason for exam-Echo    Answer:   Chemo  V67.2 / Z09    All questions were answered. The patient knows to call the clinic with any problems, questions or concerns. I spent 60 minutes counseling the patient face to face. The total time spent in the appointment was 65 minutes and more than 50% was on counseling.     Truitt Merle, MD 09/18/2016

## 2016-09-18 ENCOUNTER — Ambulatory Visit: Payer: 59 | Attending: General Surgery | Admitting: Physical Therapy

## 2016-09-18 ENCOUNTER — Ambulatory Visit
Admission: RE | Admit: 2016-09-18 | Discharge: 2016-09-18 | Disposition: A | Discharge status: Home / Self Care | Payer: BLUE CROSS/BLUE SHIELD | Source: Ambulatory Visit | Attending: Radiation Oncology | Admitting: Radiation Oncology

## 2016-09-18 ENCOUNTER — Encounter: Payer: Self-pay | Admitting: Genetic Counselor

## 2016-09-18 ENCOUNTER — Encounter: Payer: Self-pay | Admitting: Hematology

## 2016-09-18 ENCOUNTER — Encounter: Payer: Self-pay | Admitting: Physical Therapy

## 2016-09-18 ENCOUNTER — Ambulatory Visit (HOSPITAL_BASED_OUTPATIENT_CLINIC_OR_DEPARTMENT_OTHER): Payer: 59 | Admitting: Hematology

## 2016-09-18 ENCOUNTER — Other Ambulatory Visit (HOSPITAL_BASED_OUTPATIENT_CLINIC_OR_DEPARTMENT_OTHER): Payer: 59

## 2016-09-18 ENCOUNTER — Other Ambulatory Visit: Payer: Self-pay | Admitting: General Surgery

## 2016-09-18 VITALS — BP 120/49 | HR 82 | Temp 98.5°F | Resp 18 | Ht 64.0 in | Wt 182.7 lb

## 2016-09-18 DIAGNOSIS — F419 Anxiety disorder, unspecified: Secondary | ICD-10-CM

## 2016-09-18 DIAGNOSIS — C50511 Malignant neoplasm of lower-outer quadrant of right female breast: Secondary | ICD-10-CM | POA: Insufficient documentation

## 2016-09-18 DIAGNOSIS — Z803 Family history of malignant neoplasm of breast: Secondary | ICD-10-CM

## 2016-09-18 DIAGNOSIS — Z170 Estrogen receptor positive status [ER+]: Secondary | ICD-10-CM | POA: Diagnosis not present

## 2016-09-18 DIAGNOSIS — R293 Abnormal posture: Secondary | ICD-10-CM

## 2016-09-18 LAB — CBC WITH DIFFERENTIAL/PLATELET
BASO%: 0.3 % (ref 0.0–2.0)
BASOS ABS: 0 10*3/uL (ref 0.0–0.1)
EOS ABS: 0.1 10*3/uL (ref 0.0–0.5)
EOS%: 1.3 % (ref 0.0–7.0)
HEMATOCRIT: 45 % (ref 34.8–46.6)
HEMOGLOBIN: 15.2 g/dL (ref 11.6–15.9)
LYMPH#: 2 10*3/uL (ref 0.9–3.3)
LYMPH%: 25.6 % (ref 14.0–49.7)
MCH: 31.5 pg (ref 25.1–34.0)
MCHC: 33.7 g/dL (ref 31.5–36.0)
MCV: 93.2 fL (ref 79.5–101.0)
MONO#: 0.4 10*3/uL (ref 0.1–0.9)
MONO%: 5.7 % (ref 0.0–14.0)
NEUT#: 5.2 10*3/uL (ref 1.5–6.5)
NEUT%: 67.1 % (ref 38.4–76.8)
Platelets: 222 10*3/uL (ref 145–400)
RBC: 4.83 10*6/uL (ref 3.70–5.45)
RDW: 13.8 % (ref 11.2–14.5)
WBC: 7.7 10*3/uL (ref 3.9–10.3)

## 2016-09-18 LAB — COMPREHENSIVE METABOLIC PANEL
ALBUMIN: 3.9 g/dL (ref 3.5–5.0)
ALK PHOS: 75 U/L (ref 40–150)
ALT: 26 U/L (ref 0–55)
AST: 21 U/L (ref 5–34)
Anion Gap: 9 mEq/L (ref 3–11)
BUN: 12.7 mg/dL (ref 7.0–26.0)
CALCIUM: 9.7 mg/dL (ref 8.4–10.4)
CHLORIDE: 104 meq/L (ref 98–109)
CO2: 27 mEq/L (ref 22–29)
Creatinine: 0.8 mg/dL (ref 0.6–1.1)
EGFR: 89 mL/min/{1.73_m2} — AB (ref >90)
Glucose: 118 mg/dl (ref 70–140)
POTASSIUM: 3.8 meq/L (ref 3.5–5.1)
Sodium: 141 mEq/L (ref 136–145)
Total Bilirubin: 0.31 mg/dL (ref 0.20–1.20)
Total Protein: 8 g/dL (ref 6.4–8.3)

## 2016-09-18 MED ORDER — ZOLPIDEM TARTRATE 5 MG PO TABS: 5.0000 mg | ORAL_TABLET | Freq: Every evening | ORAL | 0 refills | Status: DC | PRN

## 2016-09-18 NOTE — Patient Instructions (Signed)

## 2016-09-18 NOTE — Progress Notes (Signed)
Radiation Oncology         [phone] ________________________________  Name: April Orozco MRN: [id number]  Date: 09/18/2016  DOB: 1966-07-24  CC:No PCP Per Patient  No ref. provider found     REFERRING PHYSICIAN: No ref. provider found   DIAGNOSIS: The encounter diagnosis was Malignant neoplasm of lower-outer quadrant of right female breast, unspecified estrogen receptor status ([place]).   HISTORY OF PRESENT ILLNESS: April Orozco is a 50 y.o. female seen seen in the multidisciplinary breast clinic for a new diagnosis of right breast cancer. The patient noticed palpable changes of the right breast which prompted diagnostic mammogram and and ultrasound on  09/12/16 which revealed 2.7 cm mass of the right breast with abnormality of an axillary node. Biopsy that day revealed a grade 3 invasive ductal carcinoma of the right breast, ER/PR positive, HER2 negative, Ki 67 was 15%, and the lymph node seen on ultrasound was also biopsied and her node revealed metastatic carcinoma, ER/PR positive, HER2 negative, and Ki 67 was 70%. She comes today to discuss options of treatment for her cancer care.  PREVIOUS RADIATION THERAPY: No   PAST MEDICAL HISTORY:  Past Medical History:  Diagnosis Date  . Fracture, sacrum/coccyx ([place]) 1970   "Shattered tailbone"  . Heart murmur    [facility] Cardiovascular has evaluated, felt benign  . Malignant neoplasm of lower-outer quadrant of right female breast (April Orozco) 09/13/2016  . Tachycardia    Intermittent, resolved with decreased caffeine intake       PAST SURGICAL HISTORY: Past Surgical History:  Procedure Laterality Date  . TUBAL LIGATION       FAMILY HISTORY:  Family History  Problem Relation Age of Onset  . Diabetes Mother   . Diabetes Father   . Hypertension Other      SOCIAL HISTORY:  reports that she has never smoked. She does not have any smokeless tobacco history on file. She reports that she drinks alcohol. The patient is an Therapist, sports. She lives in  [place] and is widowed but in a relationship.   ALLERGIES: Banana and Sulfur   MEDICATIONS:  Current Outpatient Prescriptions  Medication Sig Dispense Refill  . albuterol (PROVENTIL HFA;VENTOLIN HFA) 108 (90 BASE) MCG/ACT inhaler Inhale 2 puffs into the lungs every 6 (six) hours as needed for wheezing or shortness of breath. 1 Inhaler 0  . amoxicillin (AMOXIL) 500 MG capsule Take 2 capsules (1,000 mg total) by mouth 2 (two) times daily. 40 capsule 0  . tamsulosin (FLOMAX) 0.4 MG CAPS capsule Take 1 capsule (0.4 mg total) by mouth daily. 30 capsule 0  . traMADol (ULTRAM) 50 MG tablet Take 1 tablet (50 mg total) by mouth every 6 (six) hours as needed. 15 tablet 0   No current facility-administered medications for this encounter.      REVIEW OF SYSTEMS: On review of systems, the patient reports that she is doing well overall, and trying to cope with the diagnosis. She has 5 daughters and is interested in knowing how genetics plays a role in her disease. She denies any chest pain, shortness of breath, cough, fevers, chills, night sweats, unintended weight changes. She denies any bowel or bladder disturbances, and denies abdominal pain, nausea or vomiting. She denies any new musculoskeletal or joint aches or pains. A complete review of systems is obtained and is otherwise negative.     PHYSICAL EXAM:  Wt Readings from Last 3 Encounters:  09/01/14 179 lb 3.2 oz (81.3 kg)  06/13/11 165 lb 12.8 oz (  75.2 kg)   Temp Readings from Last 3 Encounters:  03/11/16 98.1 F (36.7 C)  08/25/15 97.9 F (36.6 C) (Oral)  09/01/14 98.2 F (36.8 C) (Oral)   BP Readings from Last 3 Encounters:  03/11/16 138/76  08/25/15 119/77  09/01/14 132/90   Pulse Readings from Last 3 Encounters:  03/11/16 83  08/25/15 75  09/01/14 84     Pain scale 0/10 In general this is a well appearing Caucasian female in no acute distress. She is alert and oriented x4 and appropriate throughout the examination.  HEENT reveals that the patient is normocephalic, atraumatic. EOMs are intact. PERRLA. Skin is intact without any evidence of gross lesions. Cardiovascular exam reveals a regular rate and rhythm, no clicks rubs or murmurs are auscultated. Chest is clear to auscultation bilaterally. Lymphatic assessment is performed and does not reveal any adenopathy in the cervical, supraclavicular, axillary, or inguinal chains. Bilateral breast exam is performed. She has no palpable changes noted of the left breast. On the right breast, there is fullness in the lower outer quadrant of the right breast, about 3-4 cm cm in greatest dimension, and post biopsy changes include induration, and minimal ecchymosis of the breast. Abdomen has active bowel sounds in all quadrants and is intact. The abdomen is soft, non tender, non distended. Lower extremities are negative for pretibial pitting edema, deep calf tenderness, cyanosis or clubbing.   ECOG = 1  0 - Asymptomatic (Fully active, able to carry on all predisease activities without restriction)  1 - Symptomatic but completely ambulatory (Restricted in physically strenuous activity but ambulatory and able to carry out work of a light or sedentary nature. For example, light housework, office work)  2 - Symptomatic, <50% in bed during the day (Ambulatory and capable of all self care but unable to carry out any work activities. Up and about more than 50% of waking hours)  3 - Symptomatic, >50% in bed, but not bedbound (Capable of only limited self-care, confined to bed or chair 50% or more of waking hours)  4 - Bedbound (Completely disabled. Cannot carry on any self-care. Totally confined to bed or chair)  5 - Death   Eustace Pen MM, Creech RH, Tormey DC, et al. [phone]). "Toxicity and response criteria of the [facility]". [place] Oncol. 5 (6): 649-55    LABORATORY DATA:  Lab Results  Component Value Date   WBC 7.7 09/18/2016   HGB 15.2 09/18/2016     HCT 45.0 09/18/2016   MCV 93.2 09/18/2016   PLT 222 09/18/2016   No results found for: NA, K, CL, CO2 No results found for: ALT, AST, GGT, ALKPHOS, BILITOT    RADIOGRAPHY: No results found.     IMPRESSION/PLAN: 1. Stage IIB, cT2N1Mx ER/PR positive invasive ductal carcinoma of the right breast. Dr. Lisbeth Renshaw discusses the pathology findings and reviews the nature of invasive breast disease. The consensus from the breast conference include  PAC placement followed by neoadjuvant chemotherapy, and following this with breast conservation with lumpectomy and targeted sentinel node removal. Following surgery, she would benefit from external radiotherapy to the breast, and then followed by antiestrogen therapy. We discussed the risks, benefits, short, and long term effects of radiotherapy, and the patient is interested in proceeding. Dr. Lisbeth Renshaw outlines a course of 33 fractions over 6 1/2 weeks of treatment.  Dr. Lisbeth Renshaw discusses the delivery and logistics of radiotherapy. We will see her back about 2-3 weeks after surgery to move forward with the simulation  and planning process and anticipate starting radiotherapy about 4 weeks after surgery.  2. Possible genetic predisposition to malignancy. The patient is interested in meeting with genetic counseling and will be set up to meet with our genetic counseling department.   The above documentation reflects my direct findings during this shared patient visit. Please see the separate note by Dr. Lisbeth Renshaw on this date for the remainder of the patient's plan of care.    Carola Rhine, PAC

## 2016-09-18 NOTE — Progress Notes (Signed)
START ON PATHWAY REGIMEN - Breast  [id number]: AC-T - [Doxorubicin + Cyclophosphamide q21 Days x 4 Cycles, Followed by Paclitaxel Weekly x 12 Weeks]  Doxorubicin + Cyclophosphamide (AC):   A cycle is every 21 days:     Doxorubicin (Adriamycin(R)) 60 mg/m2 IV Push followed by Dose Mod: None     Cyclophosphamide (Cytoxan(R)) 600 mg/m2 in 250 mL NS IV over 30 minutes Dose Mod: None  **Always confirm dose/schedule in your pharmacy ordering system**    Paclitaxel 80 mg/m2 Weekly:   Administer weekly:     Paclitaxel (Taxol(R)) 80 mg/m2 in 250 mL NS IV over 1 hour Dose Mod: None  **Always confirm dose/schedule in your pharmacy ordering system**    Patient Characteristics: Neoadjuvant Chemotherapy, HER2/neu Negative/Unknown/Equivocal AJCC Stage Grouping: IIB Current Disease Status: No Distant Mets or Local Recurrence AJCC M Stage: 0 ER Status: Positive (+) AJCC N Stage: 1a AJCC T Stage: 2 HER2/neu: Negative (-) PR Status: Positive (+)  Intent of Therapy: Curative Intent, Discussed with Patient

## 2016-09-18 NOTE — Addendum Note (Signed)
Addended by: Truitt Merle on: 09/18/2016 11:09 PM   Modules accepted: Orders

## 2016-09-18 NOTE — Therapy (Signed)
[place], [place], [address] Phone: [phone]   Fax:  [phone]  Physical Therapy Evaluation  Patient Details  Name: April Orozco MRN: [id number] Date of Birth: [birth year] Referring Provider: Dr. Rolm Bookbinder  Encounter Date: 09/18/2016      PT End of Session - 09/18/16 2110    Visit Number 1   Number of Visits 1   PT Start Time 4818   PT Stop Time 5631  Also saw pt from 1540-1555 for a total of 25 minutes   PT Time Calculation (min) 10 min   Activity Tolerance Patient tolerated treatment well   Behavior During Therapy [facility] for tasks assessed/performed      Past Medical History:  Diagnosis Date  . Fracture, sacrum/coccyx ([place]) 1970   "Shattered tailbone"  . Heart murmur    SE Cardiovascular has evaluated, felt benign  . Malignant neoplasm of lower-outer quadrant of right female breast ([place]) 09/13/2016  . Tachycardia    Intermittent, resolved with decreased caffeine intake    Past Surgical History:  Procedure Laterality Date  . APPENDECTOMY    . TONSILECTOMY/ADENOIDECTOMY WITH MYRINGOTOMY    . TUBAL LIGATION      There were no vitals filed for this visit.       Subjective Assessment - 09/18/16 2046    Subjective Patient reports she is here today to be seen by her medical team for her newly diagnosed right breast cancer.   Pertinent History Patient was diagnosed on 09/10/16 with right grade 3 invasive ductal carcinoma breast cancer.  It is located in the lower outer quadrant and measures 2.7 cm.  It is ER/PR positive and HER2 negative with a Ki67 of 70%.  There is also a positive axillary lymph node.   Patient Stated Goals Reduce lymphedema risk and learn post op shoulder ROM HEP   Currently in Pain? No/denies            [facility] PT Assessment - 09/18/16 0001      Assessment   Medical Diagnosis Right breast cancer   Referring Provider Dr. Rolm Bookbinder   Onset  Date/Surgical Date 09/10/16   Hand Dominance Right   Prior Therapy none     Precautions   Precautions Other (comment)   Precaution Comments Active cancer     Restrictions   Weight Bearing Restrictions No     Balance Screen   Has the patient fallen in the past 6 months No   Has the patient had a decrease in activity level because of a fear of falling?  No   Is the patient reluctant to leave their home because of a fear of falling?  No     Home Environment   Living Environment Private residence   Living Arrangements Children  Lives with 32 y.o. daughter (has 5 girls total)   Available Help at Discharge Family     Prior Function   Level of Independence Independent   Vocation Full time employment   Engineer, mining with staff development   Leisure She does not exercise     Cognition   Overall Cognitive Status Within Functional Limits for tasks assessed     Posture/Postural Control   Posture/Postural Control Postural limitations   Postural Limitations Forward head;Rounded Shoulders     ROM / Strength   AROM / PROM / Strength AROM;Strength     AROM   AROM Assessment Site Shoulder;Cervical   Right/Left Shoulder Right;Left   Right Shoulder  Extension 60 Degrees   Right Shoulder Flexion 156 Degrees   Right Shoulder ABduction 167 Degrees   Right Shoulder Internal Rotation 77 Degrees   Right Shoulder External Rotation 75 Degrees   Left Shoulder Extension 43 Degrees   Left Shoulder Flexion 148 Degrees   Left Shoulder ABduction 156 Degrees   Left Shoulder Internal Rotation 78 Degrees   Left Shoulder External Rotation 82 Degrees   Cervical Flexion WNL   Cervical Extension WNL   Cervical - Right Side Bend WNL   Cervical - Left Side Bend WNL   Cervical - Right Rotation WNL   Cervical - Left Rotation WNL     Strength   Overall Strength Within functional limits for tasks performed           LYMPHEDEMA/ONCOLOGY QUESTIONNAIRE - 09/18/16 2105      Type    Cancer Type Right breast cancer     Lymphedema Assessments   Lymphedema Assessments Upper extremities     Right Upper Extremity Lymphedema   10 cm Proximal to Olecranon Process 34.5 cm   Olecranon Process 27.6 cm   10 cm Proximal to Ulnar Styloid Process 25.6 cm   Just Proximal to Ulnar Styloid Process 16.8 cm   Across Hand at [employer] 20.3 cm   At [place] of 2nd Digit 6.9 cm     Left Upper Extremity Lymphedema   10 cm Proximal to Olecranon Process 33.2 cm   Olecranon Process 26.8 cm   10 cm Proximal to Ulnar Styloid Process 24.6 cm   Just Proximal to Ulnar Styloid Process 16.4 cm   Across Hand at [employer] 18.6 cm   At [place] of 2nd Digit 6.4 cm      Patient was instructed today in a home exercise program today for post op shoulder range of motion. These included active assist shoulder flexion in sitting, scapular retraction, wall walking with shoulder abduction, and hands behind head external rotation.  She was encouraged to do these twice a day, holding 3 seconds and repeating 5 times when permitted by her physician.         PT Education - 09/18/16 2108    Education provided Yes   Education Details Lymphedema risk reduction and post op shoulder ROM HEP   Person(s) Educated Patient   Methods Explanation;Demonstration;Handout   Comprehension Returned demonstration;Verbalized understanding              Breast Clinic Goals - 09/18/16 2117      Patient will be able to verbalize understanding of pertinent lymphedema risk reduction practices relevant to her diagnosis specifically related to skin care.   Time 1   Period Days   Status Achieved     Patient will be able to return demonstrate and/or verbalize understanding of the post-op home exercise program related to regaining shoulder range of motion.   Time 1   Period Days   Status Achieved     Patient will be able to verbalize understanding of the importance of attending the postoperative After Breast  Cancer Class for further lymphedema risk reduction education and therapeutic exercise.   Time 1   Period Days   Status Achieved              Plan - 09/18/16 2113    Clinical Impression Statement Patient was diagnosed on 09/10/16 with right grade 3 invasive ductal carcinoma breast cancer.  It is located in the lower outer quadrant and measures 2.7 cm.  It is  ER/PR positive and HER2 negative with a Ki67 of 70%.  There is also a positive axillary lymph node. Her multidisciplinary medical team met prior to her assessments to determine a recommended treatment plan.  She is planning to have neoadjuvant chemotherapy followed by a right lumpectomy or mastectomy and a targeted axillary lymph node dissection followed by radiation and anti-estrogen therapy.  She may benefit from post op PT to regain shoulder ROM and reduce lymphedema risk.  Due to her lack of comorbidities, her eval is of low complexity.   Rehab Potential Excellent   Clinical Impairments Affecting Rehab Potential none   PT Frequency One time visit   PT Treatment/Interventions Patient/family education;Therapeutic exercise   PT Next Visit Plan Will f/u after surgery to determine PT needs   PT Home Exercise Plan Post op shoulder ROM HEP and lymphedema risk reduction   Consulted and Agree with Plan of Care Patient      Patient will benefit from skilled therapeutic intervention in order to improve the following deficits and impairments:  Postural dysfunction, Pain, Decreased knowledge of precautions, Impaired UE functional use, Decreased range of motion  Visit Diagnosis: Abnormal posture - Plan: PT plan of care cert/re-cert  Carcinoma of lower-outer quadrant of right breast in female, estrogen receptor positive ([place]) - Plan: PT plan of care cert/re-cert   Patient will follow up at outpatient cancer rehab if needed following surgery.  If the patient requires physical therapy at that time, a specific plan will be dictated and sent to  the referring physician for approval. The patient was educated today on appropriate basic range of motion exercises to begin post operatively and the importance of attending the After Breast Cancer class following surgery.  Patient was educated today on lymphedema risk reduction practices as it pertains to recommendations that will benefit the patient immediately following surgery.  She verbalized good understanding.  No additional physical therapy is indicated at this time.      Problem List Patient Active Problem List   Diagnosis Date Noted  . Malignant neoplasm of lower-outer quadrant of right female breast ([place]) 09/13/2016  . Fracture, sacrum/coccyx [facility])     Annia Friendly, [place] 09/18/16 9:20 PM  [place], [place], [address] Phone: [phone]   Fax:  [phone]  Name: April Orozco MRN: [id number] Date of Birth: 04-26-1966

## 2016-09-18 NOTE — Progress Notes (Signed)
ONCBCN DISTRESS SCREENING 09/18/2016  Screening Type Initial Screening  Distress experienced in past week (1-10) 8  Practical problem type Insurance;Work/school  Family Problem type Other (comment)  Emotional problem type Nervousness/Anxiety  Physical Problem type Pain;Sleep/insomnia  Referral to support programs Yes   Met with Tia Alert in [facility] to introduce [facility] team/resources, reviewing distress screen per protocol.  The patient scored a 8 on the Psychosocial Distress Thermometer which indicates severe distress. Also assessed for distress and other psychosocial needs. Patient expressed anxiety around her daughters' ability to cope with her diagnosis and treatment. Patient shared that her husband had died of cancer several years ago. Patient felt overwhelmed but comforted in having more information and ability to form a plan. Pt requested more information about nutrition throughout treatment; counselor referred patient to nutritionist.   Rosana Fret, Counseling Intern Direct Supervisor, Lorrin Jackson, lead [employer] - [phone]

## 2016-09-19 ENCOUNTER — Encounter (HOSPITAL_BASED_OUTPATIENT_CLINIC_OR_DEPARTMENT_OTHER): Payer: Self-pay | Admitting: *Deleted

## 2016-09-19 ENCOUNTER — Telehealth: Payer: Self-pay | Admitting: Nutrition

## 2016-09-19 NOTE — Telephone Encounter (Signed)
Patient requested additional information about nutrition after she attended breast clinic. Contacted patient by phone. Patient wanted to be sure that she did not have to modify her diet during treatment and if there were foods for her to avoid. Patient did ask if she could take vitamin C, but did not have a specific amount of vitamin C in mind. Educated patient to try to follow a healthy plant-based diet including increased vegetables, whole fruits and whole gr. Encouraged lean protein foods. Recommended patient discussed high doses of vitamin C with physician before taking, but that a low dose between now start of chemotherapy should be ok. Questions were answered.  Teach back method used.  Patient was appreciative.

## 2016-09-20 ENCOUNTER — Ambulatory Visit (HOSPITAL_COMMUNITY): Admission: RE | Admit: 2016-09-20 | Payer: 59 | Source: Ambulatory Visit

## 2016-09-22 ENCOUNTER — Ambulatory Visit (HOSPITAL_COMMUNITY)
Admission: RE | Admit: 2016-09-22 | Discharge: 2016-09-22 | Disposition: A | Discharge status: Home / Self Care | Payer: 59 | Source: Ambulatory Visit | Attending: Hematology | Admitting: Hematology

## 2016-09-22 DIAGNOSIS — R609 Edema, unspecified: Secondary | ICD-10-CM | POA: Insufficient documentation

## 2016-09-22 DIAGNOSIS — C50511 Malignant neoplasm of lower-outer quadrant of right female breast: Secondary | ICD-10-CM | POA: Insufficient documentation

## 2016-09-22 DIAGNOSIS — Z170 Estrogen receptor positive status [ER+]: Secondary | ICD-10-CM | POA: Diagnosis not present

## 2016-09-22 MED ORDER — GADOBENATE DIMEGLUMINE 529 MG/ML IV SOLN
17.0000 mL | Freq: Once | INTRAVENOUS | Status: AC | PRN
  Administered 2016-09-22: 17 mL via INTRAVENOUS

## 2016-09-23 ENCOUNTER — Telehealth: Payer: Self-pay | Admitting: Hematology

## 2016-09-23 ENCOUNTER — Encounter (HOSPITAL_BASED_OUTPATIENT_CLINIC_OR_DEPARTMENT_OTHER)
Admission: RE | Disposition: A | Discharge status: Home / Self Care | Payer: Self-pay | Source: Ambulatory Visit | Attending: General Surgery

## 2016-09-23 ENCOUNTER — Ambulatory Visit (HOSPITAL_BASED_OUTPATIENT_CLINIC_OR_DEPARTMENT_OTHER): Payer: 59 | Admitting: Anesthesiology

## 2016-09-23 ENCOUNTER — Ambulatory Visit (HOSPITAL_COMMUNITY): Payer: 59

## 2016-09-23 ENCOUNTER — Encounter (HOSPITAL_BASED_OUTPATIENT_CLINIC_OR_DEPARTMENT_OTHER): Payer: Self-pay | Admitting: Certified Registered"

## 2016-09-23 ENCOUNTER — Ambulatory Visit (HOSPITAL_BASED_OUTPATIENT_CLINIC_OR_DEPARTMENT_OTHER)
Admission: RE | Admit: 2016-09-23 | Discharge: 2016-09-23 | Disposition: A | Discharge status: Home / Self Care | Payer: 59 | Source: Ambulatory Visit | Attending: General Surgery | Admitting: General Surgery

## 2016-09-23 DIAGNOSIS — Z87891 Personal history of nicotine dependence: Secondary | ICD-10-CM | POA: Insufficient documentation

## 2016-09-23 DIAGNOSIS — Z6831 Body mass index (BMI) 31.0-31.9, adult: Secondary | ICD-10-CM | POA: Insufficient documentation

## 2016-09-23 DIAGNOSIS — Z95828 Presence of other vascular implants and grafts: Secondary | ICD-10-CM

## 2016-09-23 DIAGNOSIS — Z803 Family history of malignant neoplasm of breast: Secondary | ICD-10-CM | POA: Insufficient documentation

## 2016-09-23 DIAGNOSIS — Z452 Encounter for adjustment and management of vascular access device: Secondary | ICD-10-CM | POA: Insufficient documentation

## 2016-09-23 DIAGNOSIS — C50911 Malignant neoplasm of unspecified site of right female breast: Secondary | ICD-10-CM | POA: Insufficient documentation

## 2016-09-23 HISTORY — PX: PORTACATH PLACEMENT: SHX2246

## 2016-09-23 SURGERY — INSERTION, TUNNELED CENTRAL VENOUS DEVICE, WITH PORT
Anesthesia: General | Site: Chest | Laterality: Right

## 2016-09-23 MED ORDER — HYDROCODONE-ACETAMINOPHEN 5-325 MG PO TABS
1.0000 | ORAL_TABLET | Freq: Once | ORAL | Status: AC
  Administered 2016-09-23: 1 via ORAL

## 2016-09-23 MED ORDER — BUPIVACAINE HCL (PF) 0.25 % IJ SOLN
INTRAMUSCULAR | Status: AC
Start: 2016-09-23 — End: 2016-09-23
  Filled 2016-09-23: qty 30

## 2016-09-23 MED ORDER — HYDROCODONE-ACETAMINOPHEN 5-325 MG PO TABS
ORAL_TABLET | ORAL | Status: AC
  Filled 2016-09-23: qty 1

## 2016-09-23 MED ORDER — CEFAZOLIN SODIUM-DEXTROSE 2-4 GM/100ML-% IV SOLN
INTRAVENOUS | Status: AC
  Filled 2016-09-23: qty 100

## 2016-09-23 MED ORDER — LACTATED RINGERS IV SOLN
INTRAVENOUS | Status: DC
  Administered 2016-09-23 (×2): via INTRAVENOUS

## 2016-09-23 MED ORDER — GABAPENTIN 300 MG PO CAPS
ORAL_CAPSULE | ORAL | Status: AC
  Filled 2016-09-23: qty 1

## 2016-09-23 MED ORDER — FENTANYL CITRATE (PF) 100 MCG/2ML IJ SOLN
INTRAMUSCULAR | Status: AC
  Filled 2016-09-23: qty 2

## 2016-09-23 MED ORDER — HEPARIN SOD (PORK) LOCK FLUSH 100 UNIT/ML IV SOLN
INTRAVENOUS | Status: DC | PRN
  Administered 2016-09-23: 400 [IU] via INTRAVENOUS

## 2016-09-23 MED ORDER — GABAPENTIN 300 MG PO CAPS
300.0000 mg | ORAL_CAPSULE | ORAL | Status: AC
  Administered 2016-09-23: 300 mg via ORAL

## 2016-09-23 MED ORDER — ACETAMINOPHEN 500 MG PO TABS
ORAL_TABLET | ORAL | Status: AC
  Filled 2016-09-23: qty 2

## 2016-09-23 MED ORDER — HYDROCODONE-ACETAMINOPHEN 10-325 MG PO TABS: 1.0000 | ORAL_TABLET | Freq: Four times a day (QID) | ORAL | 0 refills | Status: DC | PRN

## 2016-09-23 MED ORDER — LIDOCAINE HCL (CARDIAC) 20 MG/ML IV SOLN
INTRAVENOUS | Status: DC | PRN
  Administered 2016-09-23: 60 mg via INTRAVENOUS

## 2016-09-23 MED ORDER — CHLORHEXIDINE GLUCONATE CLOTH 2 % EX PADS: 6.0000 | MEDICATED_PAD | Freq: Once | CUTANEOUS | Status: DC

## 2016-09-23 MED ORDER — DEXAMETHASONE SODIUM PHOSPHATE 10 MG/ML IJ SOLN
INTRAMUSCULAR | Status: AC
  Filled 2016-09-23: qty 1

## 2016-09-23 MED ORDER — ACETAMINOPHEN 500 MG PO TABS
1000.0000 mg | ORAL_TABLET | ORAL | Status: AC
  Administered 2016-09-23: 1000 mg via ORAL

## 2016-09-23 MED ORDER — PROPOFOL 10 MG/ML IV BOLUS
INTRAVENOUS | Status: DC | PRN
  Administered 2016-09-23: 200 mg via INTRAVENOUS

## 2016-09-23 MED ORDER — GLYCOPYRROLATE 0.2 MG/ML IJ SOLN: 0.2000 mg | Freq: Once | INTRAMUSCULAR | Status: DC | PRN

## 2016-09-23 MED ORDER — CEFAZOLIN SODIUM-DEXTROSE 2-4 GM/100ML-% IV SOLN
2.0000 g | INTRAVENOUS | Status: AC
  Administered 2016-09-23: 2 g via INTRAVENOUS

## 2016-09-23 MED ORDER — HEPARIN (PORCINE) IN NACL 2-0.9 UNIT/ML-% IJ SOLN
INTRAMUSCULAR | Status: DC | PRN
  Administered 2016-09-23: 500 mL via INTRAVENOUS

## 2016-09-23 MED ORDER — METHYLPREDNISOLONE SODIUM SUCC 125 MG IJ SOLR
INTRAMUSCULAR | Status: DC | PRN
  Administered 2016-09-23: 125 mg via INTRAVENOUS

## 2016-09-23 MED ORDER — LIDOCAINE 2% (20 MG/ML) 5 ML SYRINGE
INTRAMUSCULAR | Status: AC
  Filled 2016-09-23: qty 5

## 2016-09-23 MED ORDER — HEPARIN (PORCINE) IN NACL 2-0.9 UNIT/ML-% IJ SOLN
INTRAMUSCULAR | Status: AC
  Filled 2016-09-23: qty 500

## 2016-09-23 MED ORDER — ONDANSETRON HCL 4 MG/2ML IJ SOLN
INTRAMUSCULAR | Status: AC
  Filled 2016-09-23: qty 2

## 2016-09-23 MED ORDER — HEPARIN SOD (PORK) LOCK FLUSH 100 UNIT/ML IV SOLN
INTRAVENOUS | Status: AC
  Filled 2016-09-23: qty 5

## 2016-09-23 MED ORDER — DEXAMETHASONE SODIUM PHOSPHATE 4 MG/ML IJ SOLN
INTRAMUSCULAR | Status: DC | PRN
  Administered 2016-09-23: 4 mg via INTRAVENOUS

## 2016-09-23 MED ORDER — ALBUTEROL SULFATE HFA 108 (90 BASE) MCG/ACT IN AERS
2.0000 | INHALATION_SPRAY | Freq: Once | RESPIRATORY_TRACT | Status: AC
Start: 2016-09-23 — End: 2016-09-23
  Administered 2016-09-23: 2 via RESPIRATORY_TRACT

## 2016-09-23 MED ORDER — FENTANYL CITRATE (PF) 100 MCG/2ML IJ SOLN
25.0000 ug | INTRAMUSCULAR | Status: DC | PRN
  Administered 2016-09-23: 50 ug via INTRAVENOUS

## 2016-09-23 MED ORDER — IOPAMIDOL (ISOVUE-300) INJECTION 61%
INTRAVENOUS | Status: AC
  Filled 2016-09-23: qty 50

## 2016-09-23 MED ORDER — BUPIVACAINE HCL (PF) 0.25 % IJ SOLN
INTRAMUSCULAR | Status: DC | PRN
  Administered 2016-09-23: 9 mL

## 2016-09-23 MED ORDER — MIDAZOLAM HCL 2 MG/2ML IJ SOLN
1.0000 mg | INTRAMUSCULAR | Status: DC | PRN
  Administered 2016-09-23: 2 mg via INTRAVENOUS

## 2016-09-23 MED ORDER — FENTANYL CITRATE (PF) 100 MCG/2ML IJ SOLN
50.0000 ug | INTRAMUSCULAR | Status: AC | PRN
  Administered 2016-09-23 (×3): 50 ug via INTRAVENOUS

## 2016-09-23 MED ORDER — SCOPOLAMINE 1 MG/3DAYS TD PT72: 1.0000 | MEDICATED_PATCH | Freq: Once | TRANSDERMAL | Status: DC | PRN

## 2016-09-23 MED ORDER — PROPOFOL 500 MG/50ML IV EMUL
INTRAVENOUS | Status: AC
  Filled 2016-09-23: qty 50

## 2016-09-23 MED ORDER — ALBUTEROL SULFATE HFA 108 (90 BASE) MCG/ACT IN AERS
INHALATION_SPRAY | RESPIRATORY_TRACT | Status: AC
  Filled 2016-09-23: qty 6.7

## 2016-09-23 MED ORDER — MIDAZOLAM HCL 2 MG/2ML IJ SOLN
INTRAMUSCULAR | Status: AC
  Filled 2016-09-23: qty 2

## 2016-09-23 MED ORDER — ONDANSETRON HCL 4 MG/2ML IJ SOLN
INTRAMUSCULAR | Status: DC | PRN
  Administered 2016-09-23: 4 mg via INTRAVENOUS

## 2016-09-23 SURGICAL SUPPLY — 52 items
BAG DECANTER FOR FLEXI CONT (MISCELLANEOUS) ×3 IMPLANT
BENZOIN TINCTURE PRP APPL 2/3 (GAUZE/BANDAGES/DRESSINGS) IMPLANT
BLADE SURG 11 STRL SS (BLADE) ×3 IMPLANT
BLADE SURG 15 STRL LF DISP TIS (BLADE) ×1 IMPLANT
BLADE SURG 15 STRL SS (BLADE) ×2
CANISTER SUCT 1200ML W/VALVE (MISCELLANEOUS) IMPLANT
CHLORAPREP W/TINT 26ML (MISCELLANEOUS) ×3 IMPLANT
CLOSURE WOUND 1/2 X4 (GAUZE/BANDAGES/DRESSINGS)
COVER BACK TABLE 60X90IN (DRAPES) ×3 IMPLANT
COVER MAYO STAND STRL (DRAPES) ×3 IMPLANT
COVER PROBE 5X48 (MISCELLANEOUS) ×2
DECANTER SPIKE VIAL GLASS SM (MISCELLANEOUS) IMPLANT
DERMABOND ADVANCED (GAUZE/BANDAGES/DRESSINGS) ×2
DERMABOND ADVANCED .7 DNX12 (GAUZE/BANDAGES/DRESSINGS) ×1 IMPLANT
DRAPE C-ARM 42X72 X-RAY (DRAPES) ×3 IMPLANT
DRAPE LAPAROSCOPIC ABDOMINAL (DRAPES) ×3 IMPLANT
DRAPE UTILITY XL STRL (DRAPES) ×3 IMPLANT
DRSG TEGADERM 4X4.75 (GAUZE/BANDAGES/DRESSINGS) IMPLANT
ELECT COATED BLADE 2.86 ST (ELECTRODE) ×3 IMPLANT
ELECT REM PT RETURN 9FT ADLT (ELECTROSURGICAL) ×3
ELECTRODE REM PT RTRN 9FT ADLT (ELECTROSURGICAL) ×1 IMPLANT
GLOVE BIO SURGEON STRL SZ7 (GLOVE) ×3 IMPLANT
GLOVE BIOGEL PI IND STRL 7.0 (GLOVE) ×2 IMPLANT
GLOVE BIOGEL PI IND STRL 7.5 (GLOVE) ×1 IMPLANT
GLOVE BIOGEL PI INDICATOR 7.0 (GLOVE) ×4
GLOVE BIOGEL PI INDICATOR 7.5 (GLOVE) ×2
GLOVE ECLIPSE 6.5 STRL STRAW (GLOVE) ×3 IMPLANT
GOWN STRL REUS W/ TWL LRG LVL3 (GOWN DISPOSABLE) ×2 IMPLANT
GOWN STRL REUS W/TWL LRG LVL3 (GOWN DISPOSABLE) ×4
IV KIT MINILOC 20X1 SAFETY (NEEDLE) IMPLANT
KIT CVR 48X5XPRB PLUP LF (MISCELLANEOUS) ×1 IMPLANT
NDL SAFETY ECLIPSE 18X1.5 (NEEDLE) IMPLANT
NEEDLE HYPO 18GX1.5 SHARP (NEEDLE)
NEEDLE HYPO 25X1 1.5 SAFETY (NEEDLE) ×3 IMPLANT
PACK BASIN DAY SURGERY FS (CUSTOM PROCEDURE TRAY) ×3 IMPLANT
PENCIL BUTTON HOLSTER BLD 10FT (ELECTRODE) ×3 IMPLANT
SLEEVE SCD COMPRESS KNEE MED (MISCELLANEOUS) ×3 IMPLANT
SPONGE GAUZE 4X4 12PLY STER LF (GAUZE/BANDAGES/DRESSINGS) ×3 IMPLANT
STRIP CLOSURE SKIN 1/2X4 (GAUZE/BANDAGES/DRESSINGS) IMPLANT
SUT MON AB 4-0 PC3 18 (SUTURE) ×3 IMPLANT
SUT PROLENE 2 0 SH DA (SUTURE) ×3 IMPLANT
SUT SILK 2 0 TIES 17X18 (SUTURE)
SUT SILK 2-0 18XBRD TIE BLK (SUTURE) IMPLANT
SUT VIC AB 3-0 SH 27 (SUTURE) ×2
SUT VIC AB 3-0 SH 27X BRD (SUTURE) ×1 IMPLANT
SYR 5ML LUER SLIP (SYRINGE) ×3 IMPLANT
SYR CONTROL 10ML LL (SYRINGE) ×3 IMPLANT
TOWEL OR 17X24 6PK STRL BLUE (TOWEL DISPOSABLE) ×3 IMPLANT
TOWEL OR NON WOVEN STRL DISP B (DISPOSABLE) ×3 IMPLANT
TUBE CONNECTING 20'X1/4 (TUBING)
TUBE CONNECTING 20X1/4 (TUBING) IMPLANT
YANKAUER SUCT BULB TIP NO VENT (SUCTIONS) IMPLANT

## 2016-09-23 NOTE — H&P (Signed)
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 She palpated a right breast mass a couple months ago. this has not changed. she has no prior breast history. she has learned that she has three 1st cousins with breast cancer all around the age of 48. she is not sure if they have had genetic testing. she underwent mm that showed c density breasts. there is a 2.6 cm oval mass at 9 oclock in the right breast. there was also a 6 mm left breast area that ends up being benign tissue. US showed a 2.7 cm mass with some skin thickening and a lobulated axillary node. 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 she has no discharge. she is here to discuss options today   Other Problems Conni Slipper, RN; 09/18/2016 8:14 AM) Asthma Heart murmur Kidney Edythe Clarity In Breast  Past Surgical History Conni Slipper, RN; 09/18/2016 8:14 AM) Appendectomy Breast Biopsy Right. Tonsillectomy  Diagnostic Studies History Conni Slipper, RN; 09/18/2016 8:14 AM) Colonoscopy never Mammogram within last year Pap Smear >5 years ago  Medication History Conni Slipper, RN; 09/18/2016 8:14 AM) Medications Reconciled  Social History Conni Slipper, RN; 09/18/2016 8:14 AM) Alcohol use Occasional alcohol use. Caffeine use Coffee. No drug use Tobacco use Former smoker.  Family History Conni Slipper, RN; 09/18/2016 8:14 AM) Bleeding disorder Mother. Breast Cancer Family Members In General. Colon Cancer Family Members In General. Colon Polyps Family Members In General. Hypertension Father, Mother. Melanoma Father. Prostate Cancer Family Members In General.  Pregnancy / Birth History Conni Slipper, RN; 09/18/2016 8:14 AM) Age at menarche 50 years. Gravida 5 Length (months) of breastfeeding 3-6 Maternal age  48-20 Para 5 Regular periods    Review of Systems Conni Slipper RN; 09/18/2016 8:14 AM) General Present- Night Sweats. Not Present- Appetite Loss, Chills, Fatigue, Fever, Weight Gain and Weight Loss. Skin Not Present- Change in Wart/Mole, Dryness, Hives, Jaundice, New Lesions, Non-Healing Wounds, Rash and Ulcer. HEENT Present- Wears glasses/contact lenses. Not Present- Earache, Hearing Loss, Hoarseness, Nose Bleed, Oral Ulcers, Ringing in the Ears, Seasonal Allergies, Sinus Pain, Sore Throat, Visual Disturbances and Yellow Eyes. Respiratory Not Present- Bloody sputum, Chronic Cough, Difficulty Breathing, Snoring and Wheezing. Breast Present- Breast Mass and Breast Pain. Not Present- Nipple Discharge and Skin Changes. Cardiovascular Not Present- Chest Pain, Difficulty Breathing Lying Down, Leg Cramps, Palpitations, Rapid Heart Rate, Shortness of Breath and Swelling of Extremities. Gastrointestinal Not Present- Abdominal Pain, Bloating, Bloody Stool, Change in Bowel Habits, Chronic diarrhea, Constipation, Difficulty Swallowing, Excessive gas, Gets full quickly at meals, Hemorrhoids, Indigestion, Nausea, Rectal Pain and Vomiting. Female Genitourinary Not Present- Frequency, Nocturia, Painful Urination, Pelvic Pain and Urgency. Musculoskeletal Not Present- Back Pain, Joint Pain, Joint Stiffness, Muscle Pain, Muscle Weakness and Swelling of Extremities. Neurological Not Present- Decreased Memory, Fainting, Headaches, Numbness, Seizures, Tingling, Tremor, Trouble walking and Weakness. Psychiatric Not Present- Anxiety, Bipolar, Change in Sleep Pattern, Depression, Fearful and Frequent crying. Endocrine Present- Hot flashes. Not Present- Cold Intolerance, Excessive Hunger, Hair Changes, Heat Intolerance and New Diabetes. Hematology Not Present- Blood Thinners, Easy Bruising, Excessive bleeding, Gland problems, HIV and Persistent Infections.   Physical Exam Rolm Bookbinder MD; 09/19/2016 6:20  AM) General Mental Status-Alert. Orientation-Oriented X3.  Chest and Lung Exam Chest and lung exam reveals -on auscultation, normal breath sounds,  no adventitious sounds and normal vocal resonance.  Breast Nipples-No Discharge. Note: right breast 9 oclock near nac 3 cm mass no direct skin involvement, mobile, nontender   Cardiovascular Cardiovascular examination reveals -normal heart sounds, regular rate and rhythm with no murmurs.  Lymphatic Head & Neck  General Head & Neck Lymphatics: Bilateral - Description - Normal. Axillary  General Axillary Region: Bilateral - Description - Normal. Note: no [place] adenopathy     Assessment & Plan Rolm Bookbinder MD; 09/19/2016 6:20 AM) BREAST CANCER OF LOWER-INNER QUADRANT OF RIGHT FEMALE BREAST (C50.311)   Plan for port placement for primary chemotherapy. Discussed nodal status and hopefully downstaging this to either do on trial or consider TAD.  We discussed genetic testing and awaiting those results. We discussed lumpectomy vs mastectomy and that with negative margin there is no real difference in survival or local recurrence.  Will plan for surgery 2-3 weeks after completing chemotherapy. Has mri pending prior to beginning systemic therapy

## 2016-09-23 NOTE — Anesthesia Preprocedure Evaluation (Addendum)
Anesthesia Evaluation  Patient identified by MRN, date of birth, ID band Patient awake    Reviewed: Allergy & Precautions, H&P , Patient's Chart, lab work & pertinent test results, reviewed documented beta blocker date and time   Airway Mallampati: II  TM Distance: >3 FB Neck ROM: full    Dental no notable dental hx.    Pulmonary asthma (pt does not routinely use inhaler. She has had a URI since one week She feels well except for congesttion and is not having a productive cough) ,    + rhonchi  + wheezing      Cardiovascular  Rhythm:regular Rate:Normal     Neuro/Psych    GI/Hepatic   Endo/Other  Morbid obesity  Renal/GU      Musculoskeletal   Abdominal   Peds  Hematology   Anesthesia Other Findings   Reproductive/Obstetrics                           Anesthesia Physical Anesthesia Plan  ASA: II  Anesthesia Plan:    Post-op Pain Management:    Induction: Intravenous  Airway Management Planned: LMA  Additional Equipment:   Intra-op Plan:   Post-operative Plan:   Informed Consent: I have reviewed the patients History and Physical, chart, labs and discussed the procedure including the risks, benefits and alternatives for the proposed anesthesia with the patient or authorized representative who has indicated his/her understanding and acceptance.   Dental Advisory Given and Dental advisory given  Plan Discussed with: CRNA and Surgeon  Anesthesia Plan Comments: (Discussed GA with possible complication from ongoing URI; It might be best to delay a few weeks until she has cleared her airway irritability. Discussion with Dr Donne Hazel shines light on the importance of urgently starting her chemo. We will try to avoid ETT, use pre-op albuterol and steroids,  and I have explained to the patient how she is at increased risk of post op ventilation complications which are unavoidable. )        Anesthesia Quick Evaluation

## 2016-09-23 NOTE — Anesthesia Postprocedure Evaluation (Signed)
Anesthesia Post Note  Patient: April Orozco  Procedure(s) Performed: Procedure(s) (LRB): INSERTION PORT-A-CATH WITH [place] (Right)  Patient location during evaluation: PACU Anesthesia Type: General Level of consciousness: awake and alert and oriented Pain management: pain level controlled Vital Signs Assessment: post-procedure vital signs reviewed and stable Respiratory status: spontaneous breathing, nonlabored ventilation and respiratory function stable Cardiovascular status: blood pressure returned to baseline and stable Postop Assessment: no signs of nausea or vomiting Anesthetic complications: no    Last Vitals:  Vitals:   09/23/16 0915 09/23/16 0930  BP: 100/65 120/66  Pulse: 80 89  Resp: 15 17  Temp:      Last Pain:  Vitals:   09/23/16 0945  TempSrc:   PainSc: 5                  Can Lucci A.

## 2016-09-23 NOTE — Interval H&P Note (Signed)
History and Physical Interval Note:  09/23/2016 8:08 AM  April Orozco  has presented today for surgery, with the diagnosis of BREAST CANCER  The various methods of treatment have been discussed with the patient and family. After consideration of risks, benefits and other options for treatment, the patient has consented to  Procedure(s): INSERTION PORT-A-CATH WITH [place] (N/A) as a surgical intervention .  The patient's history has been reviewed, patient examined, no change in status, stable for surgery.  I have reviewed the patient's chart and labs.  Questions were answered to the patient's satisfaction.     Atoya Andrew

## 2016-09-23 NOTE — Anesthesia Procedure Notes (Signed)
Procedure Name: LMA Insertion Date/Time: 09/23/2016 8:25 AM Performed by: Aideliz Garmany D Pre-anesthesia Checklist: Patient identified, Emergency Drugs available, Suction available and Patient being monitored Patient Re-evaluated:Patient Re-evaluated prior to inductionOxygen Delivery Method: Circle system utilized Preoxygenation: Pre-oxygenation with 100% oxygen Intubation Type: IV induction Ventilation: Mask ventilation without difficulty LMA: LMA inserted LMA Size: 4.0 Number of attempts: 1 Airway Equipment and Method: Bite block Placement Confirmation: positive ETCO2 Tube secured with: Tape Dental Injury: Teeth and Oropharynx as per pre-operative assessment

## 2016-09-23 NOTE — Telephone Encounter (Signed)
Left message re 11/1 appointment. Patient to get schedule 11/1. Echo to managed care for preauth.

## 2016-09-23 NOTE — Op Note (Signed)
Preoperative diagnosis: breast cancer need for venous access Postoperative diagnosis: same as above Procedure: right ij US guided powerport insertion Surgeon: Dr Serita Grammes EBL: minimal Anes: general  Specimens none Complications none Drains none Sponge count correct Dispo to pacu stable  Indications: This is a 84 yof who has newly diagnosed node positive right breast cancer. She is due to begin primary systemic chemotherapy. We discussed port placement. She does have a uri but due to need to begin therapy I think risks of procedure are outweighed by need for therapy.   Procedure: After informed consent was obtained the patient was taken to the operating room. She was given antibiotics. Sequential compression devices were on her legs. She was then placed under general anesthesia with an LMA. Then she was prepped and draped in the standard sterile surgical fashion. Surgical timeout was then performed.  I used the ultrasound to identify the right internal jugular vein. I then accessed the vein using the ultrasound. This aspirated blood. I then placed the wire. This was confirmed by fluoroscopy and ultrasound to be in the correct position. I created a pocket on the right chest.. I tunneled the line between the 2 sites. I then dilated the tract and placed the dilator assembly with the sheath. This was done under fluoroscopy. I then removed the sheath and dilator. The wire was also removed. The line was then pulled back to be in the venacava. I hooked this up to the port. I sutured this into place with 2-0 Prolene in 2 places. This aspirated blood and flushed easily.This was confirmed with a final fluoroscopy. I then closed this with 2-0 Vicryl and 4-0 Monocryl. Dermabond was placed on both the incisions.A dressing was placed. She tolerated this well and was transferred to the recovery room in stable condition.

## 2016-09-23 NOTE — Discharge Instructions (Signed)

## 2016-09-23 NOTE — Telephone Encounter (Signed)
I tried to call pt to discuss the clinical trial PREVENT, I left a message on her home phone and her daughter's phone and asked her to call us back.   Truitt Merle  09/23/2016

## 2016-09-23 NOTE — Transfer of Care (Signed)
Immediate Anesthesia Transfer of Care Note  Patient: April Orozco  Procedure(s) Performed: Procedure(s): INSERTION PORT-A-CATH WITH [place] (Right)  Patient Location: PACU  Anesthesia Type:General  Level of Consciousness: awake and patient cooperative  Airway & Oxygen Therapy: Patient Spontanous Breathing and Patient connected to face mask oxygen  Post-op Assessment: Report given to RN and Post -op Vital signs reviewed and stable  Post vital signs: Reviewed and stable  Last Vitals:  Vitals:   09/23/16 0905 09/23/16 0906  BP:    Pulse: 84 82  Resp:  16  Temp:      Last Pain:  Vitals:   09/23/16 0722  TempSrc: Oral         Complications: No apparent anesthesia complications

## 2016-09-24 ENCOUNTER — Telehealth: Payer: Self-pay | Admitting: *Deleted

## 2016-09-24 ENCOUNTER — Telehealth: Payer: Self-pay | Admitting: Hematology

## 2016-09-24 ENCOUNTER — Encounter (HOSPITAL_BASED_OUTPATIENT_CLINIC_OR_DEPARTMENT_OTHER): Payer: Self-pay | Admitting: General Surgery

## 2016-09-24 NOTE — Telephone Encounter (Signed)
Left message for patient re echo appointment at [facility] 11/7 @ 10 am. Also message was left on yesterday re 11/1 ched class and confirmed again in today's message. Patient to get schedule when she comes in 11/1.  Per managed care preauth [id number].  Left message for YF desk nurse re tx to start 11/3 per care plan and echo not until 11/7. Also no los sent 10/25 and patient currently has no f/u on schedule. Treatment scheduled per care plan - echo schedule based on order and ched scheduled based on sch msg.

## 2016-09-24 NOTE — Telephone Encounter (Signed)
  Oncology Nurse Navigator Documentation  Navigator Location: [facility] (09/24/16 1300)   )Navigator Encounter Type: Telephone (09/24/16 1300) Telephone: Outgoing Call;Clinic/MDC Follow-up (09/24/16 1300)                                                  Time Spent with Patient: 15 (09/24/16 1300)

## 2016-09-25 ENCOUNTER — Other Ambulatory Visit: Payer: 59

## 2016-09-25 ENCOUNTER — Encounter: Payer: Self-pay | Admitting: *Deleted

## 2016-09-25 ENCOUNTER — Encounter: Payer: Self-pay | Admitting: Hematology

## 2016-09-25 NOTE — Progress Notes (Unsigned)
Met with patient after chemo ed class. Introduced myself as her Estate manager/land agent and asked if she had any financial questions or concerns. Patient states she has a lot of insurance coverage and does not know what covers what. Patient states she has a cancer policy and has not notified them yet. Advised patient to contact them at her earliest convenience and they will advise her if anything is needed from her. Asked patient if she has met her ded/OOP for the year. Patient states she has met her deductible and thinks she has met her OOP. Reviewed benefits online and it looks as though her OOP had not been met but she states she had some recent charges that may not have gone through yet. Advised patient I could still enroll her in the [employer] program for assistance with Neulasta even if she may not need it until insurance renews in 2018. Patient agreed and signed application. Enrolled online. Patient approved. Copy of approval and card placed in my book. Explained to patient how it works and I would monitor her account to make sure she is not charged for any other than copay. Patient verbalized understanding. Approved patient for one-time $1000 [employer] grant based on verbal income which is set. Patient has a copy of the approval letter as well as the expenses it covers along with our outpatient pharmacy information. Mentioned [employer] copay foundation as well. Advised patient when ready to apply, she will need to provide income verification just in case an audit occurs. Patient verbalized understanding. Patient has my card for any additional financial questions or concerns. Patient asked about wigs and insurance. Advised her she may contact her insurance or go to a place such as [facility] and have they can explain how insurance works and give wig amounts and that she can use her grant to help with cost of the wig. Also advised patient that we refer patients to the [facility].

## 2016-09-27 ENCOUNTER — Ambulatory Visit: Payer: 59

## 2016-09-28 ENCOUNTER — Ambulatory Visit: Payer: 59

## 2016-09-30 ENCOUNTER — Other Ambulatory Visit: Payer: 59

## 2016-10-01 ENCOUNTER — Other Ambulatory Visit (HOSPITAL_BASED_OUTPATIENT_CLINIC_OR_DEPARTMENT_OTHER): Payer: 59

## 2016-10-01 ENCOUNTER — Ambulatory Visit (HOSPITAL_BASED_OUTPATIENT_CLINIC_OR_DEPARTMENT_OTHER): Payer: 59

## 2016-10-01 ENCOUNTER — Telehealth: Payer: Self-pay | Admitting: Hematology

## 2016-10-01 ENCOUNTER — Ambulatory Visit (HOSPITAL_BASED_OUTPATIENT_CLINIC_OR_DEPARTMENT_OTHER): Payer: 59 | Admitting: Hematology

## 2016-10-01 ENCOUNTER — Encounter: Payer: Self-pay | Admitting: *Deleted

## 2016-10-01 ENCOUNTER — Ambulatory Visit (HOSPITAL_COMMUNITY)
Admission: RE | Admit: 2016-10-01 | Discharge: 2016-10-01 | Disposition: A | Discharge status: Home / Self Care | Payer: 59 | Source: Ambulatory Visit | Attending: Hematology | Admitting: Hematology

## 2016-10-01 ENCOUNTER — Encounter: Payer: Self-pay | Admitting: Hematology

## 2016-10-01 VITALS — BP 127/56 | HR 71 | Temp 98.2°F | Resp 18 | Ht 64.0 in | Wt 180.0 lb

## 2016-10-01 VITALS — BP 111/55 | HR 63 | Resp 20

## 2016-10-01 DIAGNOSIS — Z5111 Encounter for antineoplastic chemotherapy: Secondary | ICD-10-CM

## 2016-10-01 DIAGNOSIS — C50511 Malignant neoplasm of lower-outer quadrant of right female breast: Secondary | ICD-10-CM

## 2016-10-01 DIAGNOSIS — Z170 Estrogen receptor positive status [ER+]: Secondary | ICD-10-CM | POA: Diagnosis not present

## 2016-10-01 DIAGNOSIS — F419 Anxiety disorder, unspecified: Secondary | ICD-10-CM | POA: Diagnosis not present

## 2016-10-01 DIAGNOSIS — Z5189 Encounter for other specified aftercare: Secondary | ICD-10-CM

## 2016-10-01 DIAGNOSIS — C773 Secondary and unspecified malignant neoplasm of axilla and upper limb lymph nodes: Secondary | ICD-10-CM

## 2016-10-01 DIAGNOSIS — I358 Other nonrheumatic aortic valve disorders: Secondary | ICD-10-CM | POA: Insufficient documentation

## 2016-10-01 DIAGNOSIS — Z09 Encounter for follow-up examination after completed treatment for conditions other than malignant neoplasm: Secondary | ICD-10-CM | POA: Diagnosis present

## 2016-10-01 LAB — COMPREHENSIVE METABOLIC PANEL
ALBUMIN: 3.8 g/dL (ref 3.5–5.0)
ALK PHOS: 79 U/L (ref 40–150)
ALT: 29 U/L (ref 0–55)
ANION GAP: 10 meq/L (ref 3–11)
AST: 25 U/L (ref 5–34)
BUN: 12.9 mg/dL (ref 7.0–26.0)
CO2: 26 mEq/L (ref 22–29)
Calcium: 9.4 mg/dL (ref 8.4–10.4)
Chloride: 106 mEq/L (ref 98–109)
Creatinine: 0.8 mg/dL (ref 0.6–1.1)
EGFR: 89 mL/min/{1.73_m2} — AB (ref >90)
Glucose: 109 mg/dl (ref 70–140)
Potassium: 3.8 mEq/L (ref 3.5–5.1)
Sodium: 141 mEq/L (ref 136–145)
TOTAL PROTEIN: 8 g/dL (ref 6.4–8.3)

## 2016-10-01 LAB — CBC WITH DIFFERENTIAL/PLATELET
BASO%: 0.6 % (ref 0.0–2.0)
BASOS ABS: 0 10*3/uL (ref 0.0–0.1)
EOS ABS: 0.1 10*3/uL (ref 0.0–0.5)
EOS%: 1.9 % (ref 0.0–7.0)
HCT: 43.7 % (ref 34.8–46.6)
HEMOGLOBIN: 14.7 g/dL (ref 11.6–15.9)
LYMPH%: 29.6 % (ref 14.0–49.7)
MCH: 31.6 pg (ref 25.1–34.0)
MCHC: 33.6 g/dL (ref 31.5–36.0)
MCV: 94 fL (ref 79.5–101.0)
MONO#: 0.5 10*3/uL (ref 0.1–0.9)
MONO%: 6.6 % (ref 0.0–14.0)
NEUT%: 61.3 % (ref 38.4–76.8)
NEUTROS ABS: 4.5 10*3/uL (ref 1.5–6.5)
Platelets: 217 10*3/uL (ref 145–400)
RBC: 4.65 10*6/uL (ref 3.70–5.45)
RDW: 13.2 % (ref 11.2–14.5)
WBC: 7.3 10*3/uL (ref 3.9–10.3)
lymph#: 2.2 10*3/uL (ref 0.9–3.3)

## 2016-10-01 MED ORDER — SODIUM CHLORIDE 0.9 % IV SOLN
Freq: Once | INTRAVENOUS | Status: AC
  Administered 2016-10-01: 13:00:00 via INTRAVENOUS

## 2016-10-01 MED ORDER — HEPARIN SOD (PORK) LOCK FLUSH 100 UNIT/ML IV SOLN
500.0000 [IU] | Freq: Once | INTRAVENOUS | Status: AC | PRN
  Administered 2016-10-01: 500 [IU]
  Filled 2016-10-01: qty 5

## 2016-10-01 MED ORDER — PEGFILGRASTIM 6 MG/0.6ML ~~LOC~~ PSKT
6.0000 mg | PREFILLED_SYRINGE | Freq: Once | SUBCUTANEOUS | Status: AC
  Administered 2016-10-01: 6 mg via SUBCUTANEOUS
  Filled 2016-10-01: qty 0.6

## 2016-10-01 MED ORDER — SODIUM CHLORIDE 0.9% FLUSH
10.0000 mL | INTRAVENOUS | Status: DC | PRN
  Administered 2016-10-01: 10 mL
  Filled 2016-10-01: qty 10

## 2016-10-01 MED ORDER — PROCHLORPERAZINE MALEATE 10 MG PO TABS: 10.0000 mg | ORAL_TABLET | Freq: Four times a day (QID) | ORAL | 1 refills | Status: DC | PRN

## 2016-10-01 MED ORDER — SODIUM CHLORIDE 0.9 % IV SOLN
600.0000 mg/m2 | Freq: Once | INTRAVENOUS | Status: AC
  Administered 2016-10-01: 1160 mg via INTRAVENOUS
  Filled 2016-10-01: qty 58

## 2016-10-01 MED ORDER — SODIUM CHLORIDE 0.9 % IV SOLN
Freq: Once | INTRAVENOUS | Status: AC
  Administered 2016-10-01: 14:00:00 via INTRAVENOUS
  Filled 2016-10-01: qty 5

## 2016-10-01 MED ORDER — PALONOSETRON HCL INJECTION 0.25 MG/5ML
INTRAVENOUS | Status: AC
  Filled 2016-10-01: qty 5

## 2016-10-01 MED ORDER — DOXORUBICIN HCL CHEMO IV INJECTION 2 MG/ML
60.0000 mg/m2 | Freq: Once | INTRAVENOUS | Status: AC
  Administered 2016-10-01: 116 mg via INTRAVENOUS
  Filled 2016-10-01: qty 58

## 2016-10-01 MED ORDER — LIDOCAINE-PRILOCAINE 2.5-2.5 % EX CREA
TOPICAL_CREAM | CUTANEOUS | Status: AC
  Filled 2016-10-01: qty 5

## 2016-10-01 MED ORDER — ONDANSETRON HCL 8 MG PO TABS: 8.0000 mg | ORAL_TABLET | Freq: Two times a day (BID) | ORAL | 1 refills | Status: DC | PRN

## 2016-10-01 MED ORDER — LORAZEPAM 0.5 MG PO TABS: 0.5000 mg | ORAL_TABLET | Freq: Four times a day (QID) | ORAL | 0 refills | Status: DC | PRN

## 2016-10-01 MED ORDER — PALONOSETRON HCL INJECTION 0.25 MG/5ML
0.2500 mg | Freq: Once | INTRAVENOUS | Status: AC
  Administered 2016-10-01: 0.25 mg via INTRAVENOUS

## 2016-10-01 NOTE — Patient Instructions (Addendum)
[facility] Discharge Instructions for Patients Receiving Chemotherapy  Today you received the following chemotherapy agents: Doxorubicin and Cytoxan.  To help prevent nausea and vomiting after your treatment, we encourage you to take your nausea medication. Aloxi given by IV during treatment is good for 3 days, do not take zofran for the next 3 days. You may take your  compazine starting today as needed for nausea.   If you develop nausea and vomiting that is not controlled by your nausea medication, call the clinic.   BELOW ARE SYMPTOMS THAT SHOULD BE REPORTED IMMEDIATELY:  *FEVER GREATER THAN 100.5 F  *CHILLS WITH OR WITHOUT FEVER  NAUSEA AND VOMITING THAT IS NOT CONTROLLED WITH YOUR NAUSEA MEDICATION  *UNUSUAL SHORTNESS OF BREATH  *UNUSUAL BRUISING OR BLEEDING  TENDERNESS IN MOUTH AND THROAT WITH OR WITHOUT PRESENCE OF ULCERS  *URINARY PROBLEMS  *BOWEL PROBLEMS  UNUSUAL RASH Items with * indicate a potential emergency and should be followed up as soon as possible.  Feel free to call the clinic you have any questions or concerns. The clinic phone number is [phone].  Please show the [place] at check-in to the Emergency Department and triage nurse.  Doxorubicin injection What is this medicine? DOXORUBICIN (dox oh ROO bi sin) is a chemotherapy drug. It is used to treat many kinds of cancer like Hodgkin's disease, leukemia, non-Hodgkin's lymphoma, neuroblastoma, sarcoma, and Wilms' tumor. It is also used to treat bladder cancer, breast cancer, lung cancer, ovarian cancer, stomach cancer, and thyroid cancer. This medicine may be used for other purposes; ask your health care provider or pharmacist if you have questions. What should I tell my health care provider before I take this medicine? They need to know if you have any of these conditions: -blood disorders -heart disease, recent heart attack -infection (especially a virus infection such as  chickenpox, cold sores, or herpes) -irregular heartbeat -liver disease -recent or ongoing radiation therapy -an unusual or allergic reaction to doxorubicin, other chemotherapy agents, other medicines, foods, dyes, or preservatives -pregnant or trying to get pregnant -breast-feeding How should I use this medicine? This drug is given as an infusion into a vein. It is administered in a hospital or clinic by a specially trained health care professional. If you have pain, swelling, burning or any unusual feeling around the site of your injection, tell your health care professional right away. Talk to your pediatrician regarding the use of this medicine in children. Special care may be needed. Overdosage: If you think you have taken too much of this medicine contact a poison control center or emergency room at once. NOTE: This medicine is only for you. Do not share this medicine with others. What if I miss a dose? It is important not to miss your dose. Call your doctor or health care professional if you are unable to keep an appointment. What may interact with this medicine? Do not take this medicine with any of the following medications: -cisapride -droperidol -halofantrine -pimozide -zidovudine This medicine may also interact with the following medications: -chloroquine -chlorpromazine -clarithromycin -cyclophosphamide -cyclosporine -erythromycin -medicines for depression, anxiety, or psychotic disturbances -medicines for irregular heart beat like amiodarone, bepridil, dofetilide, encainide, flecainide, propafenone, quinidine -medicines for seizures like ethotoin, fosphenytoin, phenytoin -medicines for nausea, vomiting like dolasetron, ondansetron, palonosetron -medicines to increase blood counts like filgrastim, pegfilgrastim, sargramostim -methadone -methotrexate -pentamidine -progesterone -vaccines -verapamil Talk to your doctor or health care professional before taking any of  these medicines: -acetaminophen -aspirin -ibuprofen -ketoprofen -  naproxen This list may not describe all possible interactions. Give your health care provider a list of all the medicines, herbs, non-prescription drugs, or dietary supplements you use. Also tell them if you smoke, drink alcohol, or use illegal drugs. Some items may interact with your medicine. What should I watch for while using this medicine? Your condition will be monitored carefully while you are receiving this medicine. You will need important blood work done while you are taking this medicine. This drug may make you feel generally unwell. This is not uncommon, as chemotherapy can affect healthy cells as well as cancer cells. Report any side effects. Continue your course of treatment even though you feel ill unless your doctor tells you to stop. Your urine may turn red for a few days after your dose. This is not blood. If your urine is dark or brown, call your doctor. In some cases, you may be given additional medicines to help with side effects. Follow all directions for their use. Call your doctor or health care professional for advice if you get a fever, chills or sore throat, or other symptoms of a cold or flu. Do not treat yourself. This drug decreases your body's ability to fight infections. Try to avoid being around people who are sick. This medicine may increase your risk to bruise or bleed. Call your doctor or health care professional if you notice any unusual bleeding. Be careful brushing and flossing your teeth or using a toothpick because you may get an infection or bleed more easily. If you have any dental work done, tell your dentist you are receiving this medicine. Avoid taking products that contain aspirin, acetaminophen, ibuprofen, naproxen, or ketoprofen unless instructed by your doctor. These medicines may hide a fever. Men and women of childbearing age should use effective birth control methods while using  taking this medicine. Do not become pregnant while taking this medicine. There is a potential for serious side effects to an unborn child. Talk to your health care professional or pharmacist for more information. Do not breast-feed an infant while taking this medicine. Do not let others touch your urine or other body fluids for 5 days after each treatment with this medicine. Caregivers should wear latex gloves to avoid touching body fluids during this time. There is a maximum amount of this medicine you should receive throughout your life. The amount depends on the medical condition being treated and your overall health. Your doctor will watch how much of this medicine you receive in your lifetime. Tell your doctor if you have taken this medicine before. What side effects may I notice from receiving this medicine? Side effects that you should report to your doctor or health care professional as soon as possible: -allergic reactions like skin rash, itching or hives, swelling of the face, lips, or tongue -low blood counts - this medicine may decrease the number of white blood cells, red blood cells and platelets. You may be at increased risk for infections and bleeding. -signs of infection - fever or chills, cough, sore throat, pain or difficulty passing urine -signs of decreased platelets or bleeding - bruising, pinpoint red spots on the skin, black, tarry stools, blood in the urine -signs of decreased red blood cells - unusually weak or tired, fainting spells, lightheadedness -breathing problems -chest pain -fast, irregular heartbeat -mouth sores -nausea, vomiting -pain, swelling, redness at site where injected -pain, tingling, numbness in the hands or feet -swelling of ankles, feet, or hands -unusual bleeding or bruising Side  effects that usually do not require medical attention (report to your doctor or health care professional if they continue or are bothersome): -diarrhea -facial  flushing -hair loss -loss of appetite -missed menstrual periods -nail discoloration or damage -red or watery eyes -red colored urine -stomach upset This list may not describe all possible side effects. Call your doctor for medical advice about side effects. You may report side effects to FDA at 1-800-FDA-1088. Where should I keep my medicine? This drug is given in a hospital or clinic and will not be stored at home. NOTE: This sheet is a summary. It may not cover all possible information. If you have questions about this medicine, talk to your doctor, pharmacist, or health care provider.    2016, Elsevier/Gold Standard. (2013-03-09 09:54:34)  Cyclophosphamide injection What is this medicine? CYCLOPHOSPHAMIDE (sye kloe FOSS fa mide) is a chemotherapy drug. It slows the growth of cancer cells. This medicine is used to treat many types of cancer like lymphoma, myeloma, leukemia, breast cancer, and ovarian cancer, to name a few. This medicine may be used for other purposes; ask your health care provider or pharmacist if you have questions. What should I tell my health care provider before I take this medicine? They need to know if you have any of these conditions: -blood disorders -history of other chemotherapy -infection -kidney disease -liver disease -recent or ongoing radiation therapy -tumors in the bone marrow -an unusual or allergic reaction to cyclophosphamide, other chemotherapy, other medicines, foods, dyes, or preservatives -pregnant or trying to get pregnant -breast-feeding How should I use this medicine? This drug is usually given as an injection into a vein or muscle or by infusion into a vein. It is administered in a hospital or clinic by a specially trained health care professional. Talk to your pediatrician regarding the use of this medicine in children. Special care may be needed. Overdosage: If you think you have taken too much of this medicine contact a poison  control center or emergency room at once. NOTE: This medicine is only for you. Do not share this medicine with others. What if I miss a dose? It is important not to miss your dose. Call your doctor or health care professional if you are unable to keep an appointment. What may interact with this medicine? This medicine may interact with the following medications: -amiodarone -amphotericin B -azathioprine -certain antiviral medicines for HIV or AIDS such as protease inhibitors (e.g., indinavir, ritonavir) and zidovudine -certain blood pressure medications such as benazepril, captopril, enalapril, fosinopril, lisinopril, moexipril, monopril, perindopril, quinapril, ramipril, trandolapril -certain cancer medications such as anthracyclines (e.g., daunorubicin, doxorubicin), busulfan, cytarabine, paclitaxel, pentostatin, tamoxifen, trastuzumab -certain diuretics such as chlorothiazide, chlorthalidone, hydrochlorothiazide, indapamide, metolazone -certain medicines that treat or prevent blood clots like warfarin -certain muscle relaxants such as succinylcholine -cyclosporine -etanercept -indomethacin -medicines to increase blood counts like filgrastim, pegfilgrastim, sargramostim -medicines used as general anesthesia -metronidazole -natalizumab This list may not describe all possible interactions. Give your health care provider a list of all the medicines, herbs, non-prescription drugs, or dietary supplements you use. Also tell them if you smoke, drink alcohol, or use illegal drugs. Some items may interact with your medicine. What should I watch for while using this medicine? Visit your doctor for checks on your progress. This drug may make you feel generally unwell. This is not uncommon, as chemotherapy can affect healthy cells as well as cancer cells. Report any side effects. Continue your course of treatment even though you feel ill unless  your doctor tells you to stop. Drink water or other  fluids as directed. Urinate often, even at night. In some cases, you may be given additional medicines to help with side effects. Follow all directions for their use. Call your doctor or health care professional for advice if you get a fever, chills or sore throat, or other symptoms of a cold or flu. Do not treat yourself. This drug decreases your body's ability to fight infections. Try to avoid being around people who are sick. This medicine may increase your risk to bruise or bleed. Call your doctor or health care professional if you notice any unusual bleeding. Be careful brushing and flossing your teeth or using a toothpick because you may get an infection or bleed more easily. If you have any dental work done, tell your dentist you are receiving this medicine. You may get drowsy or dizzy. Do not drive, use machinery, or do anything that needs mental alertness until you know how this medicine affects you. Do not become pregnant while taking this medicine or for 1 year after stopping it. Women should inform their doctor if they wish to become pregnant or think they might be pregnant. Men should not father a child while taking this medicine and for 4 months after stopping it. There is a potential for serious side effects to an unborn child. Talk to your health care professional or pharmacist for more information. Do not breast-feed an infant while taking this medicine. This medicine may interfere with the ability to have a child. This medicine has caused ovarian failure in some women. This medicine has caused reduced sperm counts in some men. You should talk with your doctor or health care professional if you are concerned about your fertility. If you are going to have surgery, tell your doctor or health care professional that you have taken this medicine. What side effects may I notice from receiving this medicine? Side effects that you should report to your doctor or health care professional as soon as  possible: -allergic reactions like skin rash, itching or hives, swelling of the face, lips, or tongue -low blood counts - this medicine may decrease the number of white blood cells, red blood cells and platelets. You may be at increased risk for infections and bleeding. -signs of infection - fever or chills, cough, sore throat, pain or difficulty passing urine -signs of decreased platelets or bleeding - bruising, pinpoint red spots on the skin, black, tarry stools, blood in the urine -signs of decreased red blood cells - unusually weak or tired, fainting spells, lightheadedness -breathing problems -dark urine -dizziness -palpitations -swelling of the ankles, feet, hands -trouble passing urine or change in the amount of urine -weight gain -yellowing of the eyes or skin Side effects that usually do not require medical attention (report to your doctor or health care professional if they continue or are bothersome): -changes in nail or skin color -hair loss -missed menstrual periods -mouth sores -nausea, vomiting This list may not describe all possible side effects. Call your doctor for medical advice about side effects. You may report side effects to FDA at 1-800-FDA-1088. Where should I keep my medicine? This drug is given in a hospital or clinic and will not be stored at home. NOTE: This sheet is a summary. It may not cover all possible information. If you have questions about this medicine, talk to your doctor, pharmacist, or health care provider.    2016, Elsevier/Gold Standard. (2012-09-25 16:22:58)  Pegfilgrastim injection What  is this medicine? PEGFILGRASTIM (PEG fil gra stim) is a long-acting granulocyte colony-stimulating factor that stimulates the growth of neutrophils, a type of white blood cell important in the body's fight against infection. It is used to reduce the incidence of fever and infection in patients with certain types of cancer who are receiving chemotherapy that  affects the bone marrow, and to increase survival after being exposed to high doses of radiation. This medicine may be used for other purposes; ask your health care provider or pharmacist if you have questions. What should I tell my health care provider before I take this medicine? They need to know if you have any of these conditions: -kidney disease -latex allergy -ongoing radiation therapy -sickle cell disease -skin reactions to acrylic adhesives (On-Body Injector only) -an unusual or allergic reaction to pegfilgrastim, filgrastim, other medicines, foods, dyes, or preservatives -pregnant or trying to get pregnant -breast-feeding How should I use this medicine? This medicine is for injection under the skin. If you get this medicine at home, you will be taught how to prepare and give the pre-filled syringe or how to use the On-body Injector. Refer to the patient Instructions for Use for detailed instructions. Use exactly as directed. Take your medicine at regular intervals. Do not take your medicine more often than directed. It is important that you put your used needles and syringes in a special sharps container. Do not put them in a trash can. If you do not have a sharps container, call your pharmacist or healthcare provider to get one. Talk to your pediatrician regarding the use of this medicine in children. While this drug may be prescribed for selected conditions, precautions do apply. Overdosage: If you think you have taken too much of this medicine contact a poison control center or emergency room at once. NOTE: This medicine is only for you. Do not share this medicine with others. What if I miss a dose? It is important not to miss your dose. Call your doctor or health care professional if you miss your dose. If you miss a dose due to an On-body Injector failure or leakage, a new dose should be administered as soon as possible using a single prefilled syringe for manual use. What may  interact with this medicine? Interactions have not been studied. Give your health care provider a list of all the medicines, herbs, non-prescription drugs, or dietary supplements you use. Also tell them if you smoke, drink alcohol, or use illegal drugs. Some items may interact with your medicine. This list may not describe all possible interactions. Give your health care provider a list of all the medicines, herbs, non-prescription drugs, or dietary supplements you use. Also tell them if you smoke, drink alcohol, or use illegal drugs. Some items may interact with your medicine. What should I watch for while using this medicine? You may need blood work done while you are taking this medicine. If you are going to need a MRI, CT scan, or other procedure, tell your doctor that you are using this medicine (On-Body Injector only). What side effects may I notice from receiving this medicine? Side effects that you should report to your doctor or health care professional as soon as possible: -allergic reactions like skin rash, itching or hives, swelling of the face, lips, or tongue -dizziness -fever -pain, redness, or irritation at site where injected -pinpoint red spots on the skin -red or dark-brown urine -shortness of breath or breathing problems -stomach or side pain, or pain at the  shoulder -swelling -tiredness -trouble passing urine or change in the amount of urine Side effects that usually do not require medical attention (report to your doctor or health care professional if they continue or are bothersome): -bone pain -muscle pain This list may not describe all possible side effects. Call your doctor for medical advice about side effects. You may report side effects to FDA at 1-800-FDA-1088. Where should I keep my medicine? Keep out of the reach of children. Store pre-filled syringes in a refrigerator between 2 and 8 degrees C (36 and 46 degrees F). Do not freeze. Keep in carton to protect  from light. Throw away this medicine if it is left out of the refrigerator for more than 48 hours. Throw away any unused medicine after the expiration date. NOTE: This sheet is a summary. It may not cover all possible information. If you have questions about this medicine, talk to your doctor, pharmacist, or health care provider.    2016, Elsevier/Gold Standard. (2014-12-01 14:30:14)

## 2016-10-01 NOTE — Progress Notes (Signed)
On for the rest [facility]  [phone] [phone]  Clinic Follow up Note   Patient Care Team: No Pcp Per Patient as PCP - General (General Practice) Rolm Bookbinder, MD as Consulting Physician (General Surgery) Truitt Merle, MD as Consulting Physician (Hematology) Kyung Rudd, MD as Consulting Physician (Radiation Oncology) 10/01/2016  CHIEF COMPLAINTS:  Follow up right breast cancer  Oncology History   Malignant neoplasm of lower-outer quadrant of right female breast [facility])   Staging form: Breast, AJCC 7th Edition   - Clinical stage from 09/12/2016: Stage IIB (T2, N1, M0) - Signed by Truitt Merle, MD on 09/18/2016      Malignant neoplasm of lower-outer quadrant of right female breast ([facility])   09/10/2016 Mammogram    Mammogram and ultrasound showed a 2.7 cm mass at 8 clock position of the right breast, there is also a lobulated lymph nodes in the right axilla, slightly suspicious for malignancy.       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 female is here because of her recently diagnosed right breast cancer. She is accompanied by her boyfriend to our multidisciplinary breast clinic today.   She noticed a right breast lump 3 monthsa ago, no pain or tenderness, no skin change or nipple discharge. She feels the lump has been getting bigger over the past 3 months. She called her gynecologist and was finally seen a few weeks ago. She was referred to have a diagnostic mammogram and ultrasound which showed a  2.7 cm mass at the 8:00 position of the right breast, and also a lobulated lymph nodes in the right axilla. 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  She feels well, denies any skin pain, or other symptoms. No recent weight loss. 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 Her husband died from small cell cancer several years ago, she has 5 daughters. She recently found out that 3 of her paternal cousins had breast cancer.  GYN HISTORY  Menarchal: 17 LMP: regular  Contraceptive: no  HRT: n/a  G5P5: 5 daughters 30-19  CURRENT THERAPY: Neoadjuvant Adriamycin and Cytoxan, every 2 weeks, with Neulasta, for total 4 cycles, followed by weekly Taxol for 12 weeks  INTERIM HISTORY: Daleena returns for follow-up. She is accompanied by her boy friend to clinic today. She has had a port placed last week, and echocardiogram done this morning. She reports mild intermittent shooting pain in the right axillary biopsy site, no other complaints. She has not sleeping well, try to Ambien, which made her dizzy, but still can not sleeping well. She still quite anxious.   MEDICAL HISTORY:  Past Medical History:  Diagnosis Date  . Fracture, sacrum/coccyx ([place]) 1970   "Shattered tailbone"  . Heart murmur    [facility] has evaluated, felt benign  . Malignant neoplasm of lower-outer  quadrant of right female breast ([place]) 09/13/2016  . Tachycardia    Intermittent, resolved with decreased caffeine intake    SURGICAL HISTORY: Past Surgical History:  Procedure Laterality Date  . APPENDECTOMY    . PORTACATH PLACEMENT Right 09/23/2016   Procedure: INSERTION PORT-A-CATH WITH [place];  Surgeon: Rolm Bookbinder, MD;  Location: [place];  Service: General;  Laterality: Right;  . TONSILECTOMY/ADENOIDECTOMY WITH  MYRINGOTOMY    . TUBAL LIGATION      SOCIAL HISTORY: Social History   Social History  . Marital status: Widowed    Spouse name: N/A  . Number of children: 5  . Years of education: N/A   Occupational History  . 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 Smoking status: Never Smoker  . Smokeless tobacco: Never Used  . Alcohol use Yes     Comment: social drinker   . Drug use: No  . Sexual activity: Not on file   Other Topics Concern  . Not on file   Social History Narrative  . No narrative on file    FAMILY HISTORY: Family History  Problem Relation Age of Onset  . Diabetes Mother   . Diabetes Father   . Hypertension Other   . Cancer Cousin 42    breast cancer   . Cancer Cousin 46    breast cancer   . Cancer Cousin 48    breast cancer     ALLERGIES:  is allergic to banana; sulfur; and tramadol.  MEDICATIONS:  Current Outpatient Prescriptions  Medication Sig Dispense Refill  . zolpidem (AMBIEN) 5 MG tablet Take 1 tablet (5 mg total) by mouth at bedtime as needed for sleep. 15 tablet 0  . HYDROcodone-acetaminophen (NORCO) 10-325 MG tablet Take 1 tablet by mouth every 6 (six) hours as needed. (Patient not taking: Reported on 10/01/2016) 10 tablet 0  . LORazepam (ATIVAN) 0.5 MG tablet Take 1 tablet (0.5 mg total) by mouth every 6 (six) hours as needed (Nausea or vomiting). 30 tablet 0  . ondansetron (ZOFRAN) 8 MG tablet Take 1 tablet (8 mg total) by mouth 2 (two) times daily as needed. Start on the third day after chemotherapy. 30 tablet 1  . prochlorperazine (COMPAZINE) 10 MG tablet Take 1 tablet (10 mg total) by mouth every 6 (six) hours as needed (Nausea or vomiting). 30 tablet 1   No current facility-administered medications for this visit.    Facility-Administered Medications Ordered in Other Visits  Medication Dose Route Frequency Provider Last Rate Last Dose  . cyclophosphamide (CYTOXAN) 1,160 mg in sodium chloride 0.9 % 250 mL chemo  infusion  600 mg/m2 (Treatment Plan Recorded) Intravenous Once Truitt Merle, MD      . DOXOrubicin (ADRIAMYCIN) chemo injection 116 mg  60 mg/m2 (Treatment Plan Recorded) Intravenous Once Truitt Merle, MD      . heparin lock flush 100 unit/mL  500 Units Intracatheter Once PRN Truitt Merle, MD      . pegfilgrastim (NEULASTA ONPRO KIT) injection 6 mg  6 mg Subcutaneous Once Truitt Merle, MD      . sodium chloride flush (NS) 0.9 % injection 10 mL  10 mL Intracatheter PRN Truitt Merle, MD        REVIEW OF SYSTEMS:   Constitutional: Denies fevers, chills or abnormal night sweats Eyes: Denies blurriness of vision, double vision or watery eyes Ears, nose, mouth, throat, and face: Denies mucositis or sore throat Respiratory: Denies cough, dyspnea  or wheezes Cardiovascular: Denies palpitation, chest discomfort or lower extremity swelling Gastrointestinal:  Denies nausea, heartburn or change in bowel habits Skin: Denies abnormal skin rashes Lymphatics: Denies new lymphadenopathy or easy bruising Neurological:Denies numbness, tingling or new weaknesses Behavioral/Psych: Mood is stable, no new changes  All other systems were reviewed with the patient and are negative.  PHYSICAL EXAMINATION: ECOG PERFORMANCE STATUS: 0 - Asymptomatic  Vitals:   10/01/16 1126  BP: (!) 127/56  Pulse: 71  Resp: 18  Temp: 98.2 F (36.8 C)   Filed Weights   10/01/16 1126  Weight: 180 lb (81.6 kg)    GENERAL:alert, no distress and comfortable SKIN: skin color, texture, turgor are normal, no rashes or significant lesions EYES: normal, conjunctiva are pink and non-injected, sclera clear OROPHARYNX:no exudate, no erythema and lips, buccal mucosa, and tongue normal  NECK: supple, thyroid normal size, non-tender, without nodularity LYMPH:  no palpable lymphadenopathy in the cervical, axillary or inguinal LUNGS: clear to auscultation and percussion with normal breathing effort HEART: regular rate & rhythm and no murmurs and no lower  extremity edema ABDOMEN:abdomen soft, non-tender and normal bowel sounds Musculoskeletal:no cyanosis of digits and no clubbing  PSYCH: alert & oriented x 3 with fluent speech NEURO: no focal motor/sensory deficits Breasts: Breast inspection showed them to be symmetrical with no nipple discharge. There is a 3.0X3.5cm mass   in the lower outer quadrant of right breast, mildly tender. Palpation of the left breast and axilla revealed no obvious mass that I could appreciate.   LABORATORY DATA:  I have reviewed the data as listed CBC Latest Ref Rng & Units 10/01/2016 09/18/2016  WBC 3.9 - 10.3 10e3/uL 7.3 7.7  Hemoglobin 11.6 - 15.9 g/dL 14.7 15.2  Hematocrit 34.8 - 46.6 % 43.7 45.0  Platelets 145 - 400 10e3/uL 217 222   CMP Latest Ref Rng & Units 10/01/2016 09/18/2016  Glucose 70 - 140 mg/dl 109 118  BUN 7.0 - 26.0 mg/dL 12.9 12.7  Creatinine 0.6 - 1.1 mg/dL 0.8 0.8  Sodium 136 - 145 mEq/L 141 141  Potassium 3.5 - 5.1 mEq/L 3.8 3.8  CO2 22 - 29 mEq/L 26 27  Calcium 8.4 - 10.4 mg/dL 9.4 9.7  Total Protein 6.4 - 8.3 g/dL 8.0 8.0  Total Bilirubin 0.20 - 1.20 mg/dL <0.22 0.31  Alkaline Phos 40 - 150 U/L 79 75  AST 5 - 34 U/L 25 21  ALT 0 - 55 U/L 29 26     PATHOLOGY REPORTS: Diagnosis 09/12/2016 1. Lymph node, needle/core biopsy, right axilla - METASTATIC CARCINOMA WITH EXTRACAPSULAR EXTENSION. 2. Breast, right, needle core biopsy, 8 o'clock - INVASIVE DUCTAL CARCINOMA, GRADE 3.  Microscopic Comment 2. The findings are consistent with grade 3 ductal carcinoma. Breast prognostic profile will be performed.  1. PROGNOSTIC INDICATORS Results: IMMUNOHISTOCHEMICAL AND MORPHOMETRIC ANALYSIS PERFORMED MANUALLY Estrogen Receptor: 100%, POSITIVE, STRONG STAINING INTENSITY Progesterone Receptor: 100%, POSITIVE, STRONG STAINING INTENSITY Proliferation Marker Ki67: 15%  Results: HER2 - NEGATIVE RATIO OF HER2/CEP17 SIGNALS 1.52 AVERAGE HER2 COPY NUMBER PER CELL 2.35  2. PROGNOSTIC  INDICATORS Results: IMMUNOHISTOCHEMICAL AND MORPHOMETRIC ANALYSIS PERFORMED MANUALLY Estrogen Receptor: 90%, POSITIVE, STRONG STAINING INTENSITY Progesterone Receptor: 90%,  Proliferation Marker Ki67: 70%  Results: HER2 - NEGATIVE RATIO OF HER2/CEP17 SIGNALS 1.09 AVERAGE HER2 COPY NUMBER PER CELL 1.90  RADIOGRAPHIC STUDIES: I have personally reviewed the radiological images as listed and agreed with the findings in the report. Mr Breast Bilateral W Wo Contrast  Result Date: 09/23/2016 CLINICAL DATA:  New right  breast cancer, neoadjuvant chemotherapy. LABS:  Not applicable EXAM: BILATERAL BREAST MRI WITH AND WITHOUT CONTRAST TECHNIQUE: Multiplanar, multisequence MR images of both breasts were obtained prior to and following the intravenous administration of 17 ml of MultiHance. THREE-DIMENSIONAL MR IMAGE RENDERING ON INDEPENDENT WORKSTATION: Three-dimensional MR images were rendered by post-processing of the original MR data on an independent workstation. The three-dimensional MR images were interpreted, and findings are reported in the following complete MRI report for this study. Three dimensional images were evaluated at the independent DynaCad workstation COMPARISON:  Previous exams including diagnostic mammogram and ultrasound dated 09/10/2016. FINDINGS: Breast composition: c. Heterogeneous fibroglandular tissue. Background parenchymal enhancement: Marked Right breast: Irregular mass within the lower outer quadrant of the right breast, at middle depth, measuring 2.7 x 2.4 x 2.5 cm, with associated biopsy clip artifact, consistent with biopsy proven right breast cancer. No additional suspicious enhancing masses, non mass enhancement or secondary signs of malignancy within the right breast. Scattered small cysts. Left breast: No suspicious enhancing masses, non mass enhancement or secondary signs of malignancy within the left breast. Scattered small cysts. Lymph nodes: Mildly prominent lymph node  in the right axilla, with surrounding edema, with associated biopsy clip artifact, consistent with recently biopsied lymph node. No additional enlarged or morphologically abnormal lymph nodes within the right axilla. No enlarged or morphologically abnormal lymph nodes identified within the left axilla or within either internal mammary chain region. Ancillary findings:  None. IMPRESSION: 1. Biopsy-proven right breast cancer, lower outer quadrant, measuring 2.7 x 2.4 x 2.5 cm, with associated biopsy clip. 2. No MRI evidence of multifocal or multicentric disease in the right breast. 3. No MRI evidence of malignancy in the left breast. 4. No enlarged or morphologically abnormal lymph nodes. Mildly prominent lymph node, with surrounding edema, in the right axilla, consistent with recently biopsied lymph node. RECOMMENDATION: Per current treatment plan. BI-RADS CATEGORY  6: Known biopsy-proven malignancy. Electronically Signed   By: Franki Cabot M.D.   On: 09/23/2016 10:47   Dg Chest Port 1 View  Result Date: 09/23/2016 CLINICAL DATA:  PowerPort catheter . EXAM: PORTABLE CHEST 1 VIEW COMPARISON:  09/01/2014. FINDINGS: PowerPort catheter noted with lead tip projected over the superior vena cava. Mediastinum and hilar structures normal. Low lung volumes. Heart size normal. No pleural effusion or pneumothorax. No acute bony abnormality. IMPRESSION: 1. PowerPort catheter noted with lead tip projected over superior vena cava. 2. Low lung volumes. Electronically Signed   By: Marcello Moores  Register   On: 09/23/2016 09:27   Dg Fluoro Guide Cv Line-no Report  Result Date: 09/23/2016 CLINICAL DATA:  FLOURO GUIDE CV LINE Fluoroscopy was utilized by the requesting physician.  No radiographic interpretation.    ASSESSMENT & PLAN:  50 year old premenopausal Caucasian female, presented with a palpable right breast mass.  1. 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 -Due to the clinically rapid growing tumor, moderate size, positive lymph nodes, high-grade, high Ki-67, this is likely a aggressive tumor, the risk of recurrence after surgery is likely high. -I recommend her to consider neoadjuvant chemotherapy, to shrink the tumor, make the lumpectomy more feasible, and potentially avoid axillary lymph node dissection. -Giving the ER/PR strongly positive, HER-2 negative disease,  I recommend her to have dose dense Adriamycin and Cytoxan, every 2 weeks for 4 cycles, followed by weekly Taxol for 12 weeks, as neoadjuvant therapy. --Labs and echo results reviewed with patient, all within normal limits. We'll proceed with cycle AC today -I called in Compazine, Zofran for her today -Potential side effects from chemotherapy reviewed with her again, she knows to call us if she has questions.  2. Genetics  -Her young age and strong family history of breast cancer, we'll refer her to his genetics for testing, to rule out inheritable genetic syndrome.  3. Anxiety  and insomnia -She has been quite anxious since cancer diagnosis, but does not feel she needs medication -She has tried Ambien, do not like it  -I give her a prescription of lorazepam 0.5 mg as needed for insomnia and anxiety   Plan  -Lab, breast MRI, an echo results reviewed with patient. -will proceed first cycle neoadjuvant chemotherapy with AC today, with onpro  -I called in Zofran, Compazine, and rest pain for her today -I give her a prescription for wig  -I'll see her back in 2 weeks before cycle 2   Orders Placed This Encounter  Procedures  . CBC with Differential    Standing Status:   Standing    Number of Occurrences:   40     Standing Expiration Date:   10/01/2021  . Comprehensive metabolic panel    Standing Status:   Standing    Number of Occurrences:   40    Standing Expiration Date:   10/01/2021    All questions were answered. The patient knows to call the clinic with any problems, questions or concerns. I spent 20 minutes counseling the patient face to face. The total time spent in the appointment was 25 minutes and more than 50% was on counseling.     Truitt Merle, MD 10/01/2016

## 2016-10-01 NOTE — Progress Notes (Signed)
At 1350 Emend stopped. Pt complained of feeling dizzy, flushed with face and chest turning red. Advised by patient that she has had flushing reaction with steroids 20 years ago. Pharmacy consulted and Dr. Burr Medico called. Instructed to give rest of Emend over 30 minutes and run NS infusion at the same time.   Post infusion note: pt had no further reaction to the Emend infusing slower with the NS.

## 2016-10-01 NOTE — Telephone Encounter (Signed)
Gave patient avs report and appointments for November and December  °

## 2016-10-01 NOTE — Progress Notes (Signed)
Echocardiogram 2D Echocardiogram has been performed.  Aggie Cosier 10/01/2016, 10:41 AM

## 2016-10-02 ENCOUNTER — Ambulatory Visit: Payer: 59

## 2016-10-02 ENCOUNTER — Telehealth: Payer: Self-pay | Admitting: *Deleted

## 2016-10-02 ENCOUNTER — Encounter: Payer: 59 | Admitting: Nurse Practitioner

## 2016-10-02 NOTE — Telephone Encounter (Signed)
Left message with patient appointment to come in for IVF's and see symptom management.  Awaiting patient return phone call.

## 2016-10-02 NOTE — Telephone Encounter (Signed)
Pt called reporting severe headache today, along with nausea/vomiting.   Pt had first A/C chemo yesterday with Neulasta  Onpro.   Spoke with pt and was informed that pt experienced severe headache, denied blurred vision.  Had nausea and vomited x 1 this am.   Stated she is taking Compazine every 6 hours ATC to keep nausea down.   Stated she has been drinking some fluids.   Instructed pt to find a ride to come in this afternoon for [facility] evaluation.  Nurse  Reinforced that pt should not drive due to severe headache and feeling dizzy - had dizziness yesterday while in infusion.

## 2016-10-02 NOTE — Telephone Encounter (Signed)
Spoke with pt again, and was informed that pt could not come in today due to no ride.   Stated she would take Ibuprofen to help with headache.  This nurse reinforced that pt will need to come in for further evaluation, but pt declined. Spoke with Varney Biles, nurse navigator and discuss pt's situation.  Varney Biles stated she would contact pt.

## 2016-10-05 ENCOUNTER — Emergency Department (HOSPITAL_COMMUNITY)
Admission: EM | Admit: 2016-10-05 | Discharge: 2016-10-05 | Disposition: A | Discharge status: Home / Self Care | Payer: 59 | Attending: Emergency Medicine | Admitting: Emergency Medicine

## 2016-10-05 ENCOUNTER — Encounter (HOSPITAL_COMMUNITY): Payer: Self-pay | Admitting: Emergency Medicine

## 2016-10-05 ENCOUNTER — Emergency Department (HOSPITAL_COMMUNITY): Payer: 59

## 2016-10-05 ENCOUNTER — Other Ambulatory Visit: Payer: Self-pay

## 2016-10-05 DIAGNOSIS — R55 Syncope and collapse: Secondary | ICD-10-CM | POA: Diagnosis not present

## 2016-10-05 DIAGNOSIS — Z853 Personal history of malignant neoplasm of breast: Secondary | ICD-10-CM | POA: Insufficient documentation

## 2016-10-05 LAB — CBC WITH DIFFERENTIAL/PLATELET
Basophils Absolute: 0 K/uL (ref 0.0–0.1)
Basophils Relative: 0 %
Eosinophils Absolute: 0 K/uL (ref 0.0–0.7)
Eosinophils Relative: 0 %
HCT: 38.2 % (ref 36.0–46.0)
Hemoglobin: 13.1 g/dL (ref 12.0–15.0)
Lymphocytes Relative: 9 %
Lymphs Abs: 1.6 K/uL (ref 0.7–4.0)
MCH: 31.7 pg (ref 26.0–34.0)
MCHC: 34.3 g/dL (ref 30.0–36.0)
MCV: 92.5 fL (ref 78.0–100.0)
Monocytes Absolute: 0.2 K/uL (ref 0.1–1.0)
Monocytes Relative: 1 %
Neutro Abs: 15.6 K/uL — ABNORMAL HIGH (ref 1.7–7.7)
Neutrophils Relative %: 90 %
Platelets: 163 K/uL (ref 150–400)
RBC: 4.13 MIL/uL (ref 3.87–5.11)
RDW: 13.2 % (ref 11.5–15.5)
WBC: 17.4 K/uL — ABNORMAL HIGH (ref 4.0–10.5)

## 2016-10-05 LAB — URINALYSIS, ROUTINE W REFLEX MICROSCOPIC
Bilirubin Urine: NEGATIVE
Glucose, UA: NEGATIVE mg/dL
Hgb urine dipstick: NEGATIVE
Ketones, ur: NEGATIVE mg/dL
Leukocytes, UA: NEGATIVE
Nitrite: NEGATIVE
Protein, ur: 30 mg/dL — AB
Specific Gravity, Urine: 1.026 (ref 1.005–1.030)
pH: 6 (ref 5.0–8.0)

## 2016-10-05 LAB — COMPREHENSIVE METABOLIC PANEL WITH GFR
ALT: 17 U/L (ref 14–54)
AST: 21 U/L (ref 15–41)
Albumin: 4 g/dL (ref 3.5–5.0)
Alkaline Phosphatase: 86 U/L (ref 38–126)
Anion gap: 6 (ref 5–15)
BUN: 15 mg/dL (ref 6–20)
CO2: 28 mmol/L (ref 22–32)
Calcium: 9.2 mg/dL (ref 8.9–10.3)
Chloride: 103 mmol/L (ref 101–111)
Creatinine, Ser: 0.7 mg/dL (ref 0.44–1.00)
GFR calc Af Amer: >60 mL/min
GFR calc non Af Amer: >60 mL/min
Glucose, Bld: 114 mg/dL — ABNORMAL HIGH (ref 65–99)
Potassium: 3.7 mmol/L (ref 3.5–5.1)
Sodium: 137 mmol/L (ref 135–145)
Total Bilirubin: 0.3 mg/dL (ref 0.3–1.2)
Total Protein: 6.8 g/dL (ref 6.5–8.1)

## 2016-10-05 LAB — URINE MICROSCOPIC-ADD ON
Bacteria, UA: NONE SEEN
RBC / HPF: NONE SEEN RBC/hpf (ref 0–5)

## 2016-10-05 MED ORDER — HEPARIN SOD (PORK) LOCK FLUSH 100 UNIT/ML IV SOLN
INTRAVENOUS | Status: AC
  Filled 2016-10-05: qty 5

## 2016-10-05 MED ORDER — HYDROMORPHONE HCL 1 MG/ML IJ SOLN
0.5000 mg | Freq: Once | INTRAMUSCULAR | Status: AC
  Administered 2016-10-05: 0.5 mg via INTRAVENOUS
  Filled 2016-10-05: qty 1

## 2016-10-05 MED ORDER — OXYCODONE-ACETAMINOPHEN 5-325 MG PO TABS: 2.0000 | ORAL_TABLET | ORAL | 0 refills | Status: DC | PRN

## 2016-10-05 MED ORDER — MORPHINE SULFATE (PF) 4 MG/ML IV SOLN
4.0000 mg | Freq: Once | INTRAVENOUS | Status: AC
  Administered 2016-10-05: 4 mg via INTRAVENOUS
  Filled 2016-10-05: qty 1

## 2016-10-05 MED ORDER — IOPAMIDOL (ISOVUE-370) INJECTION 76%
100.0000 mL | Freq: Once | INTRAVENOUS | Status: AC | PRN
  Administered 2016-10-05: 60 mL via INTRAVENOUS

## 2016-10-05 MED ORDER — ONDANSETRON HCL 4 MG/2ML IJ SOLN
4.0000 mg | Freq: Once | INTRAMUSCULAR | Status: AC
  Administered 2016-10-05: 4 mg via INTRAVENOUS
  Filled 2016-10-05: qty 2

## 2016-10-05 MED ORDER — SODIUM CHLORIDE 0.9 % IV BOLUS (SEPSIS)
1000.0000 mL | Freq: Once | INTRAVENOUS | Status: AC
  Administered 2016-10-05: 1000 mL via INTRAVENOUS

## 2016-10-05 NOTE — ED Notes (Signed)
ED Provider at bedside. PA at bedside.

## 2016-10-05 NOTE — ED Provider Notes (Signed)
[facility] DEPT Provider Note   CSN: [id number] Arrival date & time: 10/05/16  1702     History   Chief Complaint Chief Complaint  Patient presents with  . Loss of Consciousness    HPI April Orozco is a 50 y.o. female with a past medical history of breast cancer who presents to the ED today to be evaluated after a syncopal episode. Patient states that she just began chemotherapy treatment 2 days ago and has since been feeling overall unwell. She has had diffuse aching in her joints and uncontrollable vomiting. Today she was feeling similar and while at dinner she began to feel lightheaded and fainted. Patient's husband is at bedside who states that the patient became pale and was not responding to questions and then passed out at the dinner table. This episode lasted less than a minute. Patient was too weak to walk to the car so they brought her to the ED for further evaluation. Patient states that she is experiencing central chest pain and pressure. Patient states "it feels very heavy". She does not report overt shortness of breath or dizziness. Patient does report headache.  HPI  Past Medical History:  Diagnosis Date  . Fracture, sacrum/coccyx ([place]) 1970   "Shattered tailbone"  . Heart murmur    [facility] has evaluated, felt benign  . Malignant neoplasm of lower-outer quadrant of right female breast ([place]) 09/13/2016  . Tachycardia    Intermittent, resolved with decreased caffeine intake    Patient Active Problem List   Diagnosis Date Noted  . Malignant neoplasm of lower-outer quadrant of right female breast ([place]) 09/13/2016  . Fracture, sacrum/coccyx [facility])     Past Surgical History:  Procedure Laterality Date  . APPENDECTOMY    . PORTACATH PLACEMENT Right 09/23/2016   Procedure: INSERTION PORT-A-CATH WITH [place];  Surgeon: Rolm Bookbinder, MD;  Location: [facility];  Service: General;  Laterality: Right;  . TONSILECTOMY/ADENOIDECTOMY WITH  MYRINGOTOMY    . TUBAL LIGATION      OB History    No data available       Home Medications    Prior to Admission medications   Medication Sig Start Date End Date Taking? Authorizing Provider  HYDROcodone-acetaminophen (NORCO) 10-325 MG tablet Take 1 tablet by mouth every 6 (six) hours as needed. Patient not taking: Reported on 10/01/2016 09/23/16   Rolm Bookbinder, MD  LORazepam (ATIVAN) 0.5 MG tablet Take 1 tablet (0.5 mg total) by mouth every 6 (six) hours as needed (Nausea or vomiting). 10/01/16   Truitt Merle, MD  ondansetron (ZOFRAN) 8 MG tablet Take 1 tablet (8 mg total) by mouth 2 (two) times daily as needed. Start on the third day after chemotherapy. 10/01/16   Truitt Merle, MD  prochlorperazine (COMPAZINE) 10 MG tablet Take 1 tablet (10 mg total) by mouth every 6 (six) hours as needed (Nausea or vomiting). 10/01/16   Truitt Merle, MD  zolpidem (AMBIEN) 5 MG tablet Take 1 tablet (5 mg total) by mouth at bedtime as needed for sleep. 09/18/16   Truitt Merle, MD    Family History Family History  Problem Relation Age of Onset  . Diabetes Mother   . Diabetes Father   . Hypertension Other   . Cancer Cousin 42    breast cancer   . Cancer Cousin 46    breast cancer   . Cancer Cousin 48    breast cancer     Social History Social History  Substance Use Topics  .  Smoking status: Never Smoker  . Smokeless tobacco: Never Used  . Alcohol use Yes     Comment: social drinker      Allergies   Banana; Sulfur; and Tramadol   Review of Systems Review of Systems  All other systems reviewed and are negative.    Physical Exam Updated Vital Signs BP (!) 105/54 (BP Location: Left Arm)   Pulse 75   Temp 97.5 F (36.4 C) (Oral)   Resp 16   LMP 08/20/2016 (Exact Date)   SpO2 95%   Physical Exam  Constitutional: She is oriented to person, place, and time. She appears well-developed and well-nourished. No distress.  HENT:  Head: Normocephalic and atraumatic.  Mouth/Throat: No  oropharyngeal exudate.  Eyes: Conjunctivae and EOM are normal. Pupils are equal, round, and reactive to light. Right eye exhibits no discharge. Left eye exhibits no discharge. No scleral icterus.  Cardiovascular: Normal rate, regular rhythm, normal heart sounds and intact distal pulses.  Exam reveals no gallop and no friction rub.   No murmur heard. Pulmonary/Chest: Effort normal and breath sounds normal. No respiratory distress. She has no wheezes. She has no rales. She exhibits no tenderness.  Abdominal: Soft. She exhibits no distension. There is no tenderness. There is no guarding.  Musculoskeletal: Normal range of motion. She exhibits no edema.  Neurological: She is alert and oriented to person, place, and time.  Strength 4/5 throughout. Normal sensation. No dysmetria. No gait abnormality. No pronator drift, facial drooping or slurred speech.  Skin: Skin is warm and dry. No rash noted. She is not diaphoretic. No erythema. No pallor.  Psychiatric: She has a normal mood and affect. Her behavior is normal.  Nursing note and vitals reviewed.    ED Treatments / Results  Labs (all labs ordered are listed, but only abnormal results are displayed) Labs Reviewed  COMPREHENSIVE METABOLIC PANEL  CBC WITH DIFFERENTIAL/PLATELET  URINALYSIS, ROUTINE W REFLEX MICROSCOPIC (NOT AT [facility])  I-STAT TROPOININ, ED  I-STAT BETA HCG BLOOD, ED ([facility], [facility], [facility] ONLY)    EKG  EKG Interpretation None       Radiology Dg Chest 2 View  Result Date: 10/05/2016 CLINICAL DATA:  Initial encounter. 50 year old female with syncope and sternal chest pain. Nausea. EXAM: CHEST  2 VIEW COMPARISON:  09/23/2016 FINDINGS: AP and lateral views of the chest were obtained. The lungs are clear wiithout focal pneumonia, edema, pneumothorax or pleural effusion. Interstitial markings are diffusely coarsened with chronic features. Cardiopericardial silhouette is at upper limits of normal for size. The visualized bony structures of  the thorax are intact. Telemetry leads overlie the chest. Right Port-A-Cath tip overlies the mid SVC. IMPRESSION: No active cardiopulmonary disease. Electronically Signed   By: Misty Stanley M.D.   On: 10/05/2016 18:15   Ct Angio Chest Pe W And/or Wo Contrast  Result Date: 10/05/2016 CLINICAL DATA:  Syncopal episode and entered today. Central chest pain. Nausea and dizziness. Stage III breast cancer on chemotherapy. EXAM: CT ANGIOGRAPHY CHEST WITH CONTRAST TECHNIQUE: Multidetector CT imaging of the chest was performed using the standard protocol during bolus administration of intravenous contrast. Multiplanar CT image reconstructions and MIPs were obtained to evaluate the vascular anatomy. CONTRAST:  60 mL Isovue 370 COMPARISON:  None. FINDINGS: Cardiovascular: Satisfactory opacification of the pulmonary arteries to the segmental level. No evidence of pulmonary embolism. Normal heart size. No pericardial effusion. Mediastinum/Nodes: No enlarged mediastinal, hilar, or axillary lymph nodes. Thyroid gland, trachea, and esophagus demonstrate no significant findings. Lungs/Pleura: Evaluation is limited  due to respiratory artifact. Scattered emphysematous changes in the lungs. Mild dependent atelectasis. No focal airspace disease or consolidation. No significant pulmonary nodules. No pleural effusions. No pneumothorax. Upper Abdomen: No acute abnormality. Musculoskeletal: No destructive bone lesions. Right central venous catheter with tip in the SVC. 2.1 cm diameter somewhat poorly defined soft tissue lesion in the right breast. This may be related to patient's history of breast cancer. Mammographic correlation is suggested if clinically indicated. Review of the MIP images confirms the above findings. IMPRESSION: No evidence of significant pulmonary embolus. No evidence of active pulmonary disease. No evidence of metastasis in the chest. Electronically Signed   By: Lucienne Capers M.D.   On: 10/05/2016 21:05     Procedures Procedures (including critical care time)  Medications Ordered in ED Medications  ondansetron (ZOFRAN) injection 4 mg (not administered)  morphine 4 MG/ML injection 4 mg (not administered)  sodium chloride 0.9 % bolus 1,000 mL (not administered)     Initial Impression / Assessment and Plan / ED Course  I have reviewed the triage vital signs and the nursing notes.  Pertinent labs & imaging results that were available during my care of the patient were reviewed by me and considered in my medical decision making (see chart for details).  Clinical Course     50 year old female with recent diagnosis of breast cancer presents to the ED today after having a syncopal episode. Now complaining of chest pain. Patient recently started chemotherapy, Neulasta 2 days ago. On presentation ED, patient appears very fatigued. Vitals are stable. Chest pain is reproducible on exam. However, given cancer diagnosis patient is high risk for pulmonary embolism. EKG unremarkable. CT PE study obtained which was negative for PE or metastases. No metabolic Derangement. Patient does have leukocytosis. However, there is no left shift. This is likely related to Neulasta. Patient's other symptoms also likely related to recent chemotherapy including her fatigue, diffuse myalgias and nausea. She was given fluids in the ED so medication and antiemetics symptomatic relief. Will DC with pain medication. She has had nausea medication she can take as needed. Recommend follow-up with PCP oncologist for further evaluation. Return precautions outlined in patient discharge instructions.  Patient was discussed with and seen by Dr. Stark Jock who agrees with the treatment plan.    Final Clinical Impressions(s) / ED Diagnoses   Final diagnoses:  Syncope, unspecified syncope type    New Prescriptions New Prescriptions   No medications on file     Carlos Levering, PA-C 10/07/16 0128    Veryl Speak,  MD 10/07/16 [phone]

## 2016-10-05 NOTE — Discharge Instructions (Signed)
Take pain and home nausea medication as needed. Drink plenty of fluids and get some rest. Follow up with your oncologist. Return to the ED if you experience severe worsening of your symptoms, fever, difficulty breathing.

## 2016-10-05 NOTE — ED Triage Notes (Signed)
Patient was at dinner today where she had a syncopal episode. Reports recent chemo treatment. Nausea, dizziness. Unable to stand.

## 2016-10-05 NOTE — ED Notes (Signed)
Unable to collect labs patient is not in the room 

## 2016-10-05 NOTE — ED Notes (Signed)
Unable to access port. Charge RN, Manuela Schwartz will access port.

## 2016-10-10 ENCOUNTER — Telehealth: Payer: Self-pay | Admitting: *Deleted

## 2016-10-10 NOTE — Telephone Encounter (Signed)
Called & left message for pt to call back & let us know how she is doing.  Noted she had been to ED.

## 2016-10-11 ENCOUNTER — Ambulatory Visit: Payer: 59

## 2016-10-11 ENCOUNTER — Telehealth: Payer: Self-pay | Admitting: *Deleted

## 2016-10-11 NOTE — Telephone Encounter (Signed)
Received return vm call from pt this am & this nurse returned her call & discussed her ED visit & symptoms.  She reports being very nauseated, vomiting & having severe back & leg pain after chemo & neulasta.  She states "I've had 5 children & pain was not as bad as this." She states she went out to eat at a restaurant & passed out & she thinks she was dehydrated.  She was taken to the ED.  They gave her something for pain-percocet & she has been taking 1/2 tab bid & a whole at hs.  She states she took the claritin as instructed & is still taking but still having low back pain which also goes down her legs.  She has had some constipation & is taking MOM & stool softeners.  She is drinking plenty of fluids & states (4) 16 oz water bottles daily in addition to other fluids.  She has not taken any lorazepam.  Instructed to take the lorazepam before next chemo & to use prn anxiety, nausea, or sleep.  She reports nausea is better but feels like she has indigestion or something because it feels like she has a something in her throat.  She also has an ulcer on her tongue & a bad taste in her mouth & is using biotene tid.  Instructed to also rinse with baking soda/salt water-1/2-1 tsp of eat in 1 gt of water in between as often as needed-4-5x/d.  She has been taking tums.  Instructed to take pepcid daily.  Discussed with Dr Burr Medico & she may add decadron to regimen post chemo next time.  She was in agreement with instructions given. Pt informed to call if any other problems & reminded about symptom management clinic availability.

## 2016-10-12 ENCOUNTER — Ambulatory Visit: Payer: 59

## 2016-10-14 ENCOUNTER — Telehealth: Payer: Self-pay | Admitting: *Deleted

## 2016-10-14 ENCOUNTER — Other Ambulatory Visit: Payer: Self-pay | Admitting: Hematology

## 2016-10-14 ENCOUNTER — Other Ambulatory Visit: Payer: Self-pay | Admitting: *Deleted

## 2016-10-14 DIAGNOSIS — C50511 Malignant neoplasm of lower-outer quadrant of right female breast: Secondary | ICD-10-CM

## 2016-10-14 DIAGNOSIS — Z170 Estrogen receptor positive status [ER+]: Principal | ICD-10-CM

## 2016-10-14 MED ORDER — OXYCODONE-ACETAMINOPHEN 5-325 MG PO TABS: 1.0000 | ORAL_TABLET | Freq: Three times a day (TID) | ORAL | 0 refills | Status: DC | PRN

## 2016-10-14 MED ORDER — ALPRAZOLAM 0.5 MG PO TABS: 0.5000 mg | ORAL_TABLET | Freq: Every evening | ORAL | 0 refills | Status: DC | PRN

## 2016-10-14 NOTE — Telephone Encounter (Signed)
Spoke with pt and was informed that pt has pain in center of back going down to knees.  Stated pain keeps pt up at night,  Also complained of mouth sores.  Gave pt appt for [facility] at 10 am on Tues 10/15/16.  Instructed pt to call surgeon for port issue as per Dr. Ernestina Penna instructions.  Pt voiced understanding.

## 2016-10-14 NOTE — Telephone Encounter (Signed)
Received call from patient stating she is still having a lot of pain from the neulasta injection she received after chemo.  She states it has ease off some but it is hard for her to sleep because she wakes up in pain.  She also states she is having some mouth sores and it is hard for her to eat.  Informed her we could call in some magic mouthwash for her.  She complains of something she feels like a stitch on her neck where the port a cath was placed.  She was told by a nurse practitioner that she works with not to touch it and that it looked like it was the tube.  She states she was in the ED and this was not addressed.  Will discuss with Dr. Burr Medico she may need to come in for symptom management for evaluation and/or [facility] for evaluation of her port.

## 2016-10-14 NOTE — Telephone Encounter (Signed)
Pt called and left message requesting refill of pain medication.  Dr. Burr Medico notified.  Attempted to call pt back for further information about pain unsuccessfully.   Left message on voice mail requesting a call back from pt. Pt's   Phone     [phone].

## 2016-10-15 ENCOUNTER — Telehealth: Payer: Self-pay | Admitting: *Deleted

## 2016-10-15 ENCOUNTER — Other Ambulatory Visit: Payer: 59

## 2016-10-15 ENCOUNTER — Other Ambulatory Visit: Payer: Self-pay | Admitting: Hematology

## 2016-10-15 ENCOUNTER — Encounter: Payer: 59 | Admitting: Nurse Practitioner

## 2016-10-15 ENCOUNTER — Ambulatory Visit: Payer: 59 | Admitting: Hematology

## 2016-10-15 ENCOUNTER — Ambulatory Visit: Payer: 59

## 2016-10-15 NOTE — Telephone Encounter (Signed)
Late entry - 10/14/16 - Received voicemail at 4:45pm from patient crying stating she received a call from the nurse and can't be seen until tomorrow 11/21 and she is in pain wanting to switch doctor's because she is not happy. Spoke with Dr. Burr Medico who is going to call her now and talk with her.     11/21- Spoke with patient to follow up and she states she talked with Dr. Burr Medico and  was able to get some pain medication yesterday. She states she is trying to work as much as possible and the pain seems to be worse at night and she isn't getting any sleep.  She is going to see Dr. Donne [facility] this afternoon to evaluate her port. Encouraged her to call with any needs or concerns.

## 2016-10-15 NOTE — Telephone Encounter (Signed)
I called pt back, patient was very stressed and upset about her back pain, and would like to have the prescription today. I asked her to come in and I saw her in my clinic around 5:30pm. 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 She is concerned about her port catheter at her neck site, there is probably a stitch over there, no discharge or signs of infection. She appears to be very anxious and emotional. She repeated states she has to go to work, and she has had significant low back pain which radiates to her legs after the Neulasta injection. She eats small meals, able to drink adequately, denies dizziness. She is appointment with Dr. Donne [facility] tomorrow to check her port.  I refilled her prescription of oxycodone/acetaminophen, and given her a new prescription of Xanax for anxiety and insomnia. She did not do well with lorazepam due to dizziness.   She'll return this Friday for cycle 2 chemotherapy, and I'll hold her Neulasta, and recheck her CBC 10 days after her chemotherapy.  Truitt Merle MD  10/14/2016

## 2016-10-16 ENCOUNTER — Ambulatory Visit: Payer: 59

## 2016-10-18 ENCOUNTER — Encounter: Payer: Self-pay | Admitting: Nurse Practitioner

## 2016-10-18 ENCOUNTER — Ambulatory Visit: Payer: 59

## 2016-10-18 ENCOUNTER — Ambulatory Visit (HOSPITAL_BASED_OUTPATIENT_CLINIC_OR_DEPARTMENT_OTHER): Payer: 59

## 2016-10-18 ENCOUNTER — Telehealth: Payer: Self-pay | Admitting: Hematology

## 2016-10-18 ENCOUNTER — Other Ambulatory Visit: Payer: Self-pay | Admitting: Medical Oncology

## 2016-10-18 ENCOUNTER — Other Ambulatory Visit (HOSPITAL_BASED_OUTPATIENT_CLINIC_OR_DEPARTMENT_OTHER): Payer: 59

## 2016-10-18 ENCOUNTER — Ambulatory Visit (HOSPITAL_BASED_OUTPATIENT_CLINIC_OR_DEPARTMENT_OTHER): Payer: 59 | Admitting: Nurse Practitioner

## 2016-10-18 VITALS — BP 120/55 | HR 99 | Temp 98.4°F | Resp 17 | Ht 64.0 in | Wt 181.2 lb

## 2016-10-18 DIAGNOSIS — Z170 Estrogen receptor positive status [ER+]: Principal | ICD-10-CM

## 2016-10-18 DIAGNOSIS — C50511 Malignant neoplasm of lower-outer quadrant of right female breast: Secondary | ICD-10-CM

## 2016-10-18 DIAGNOSIS — Z5111 Encounter for antineoplastic chemotherapy: Secondary | ICD-10-CM

## 2016-10-18 DIAGNOSIS — C773 Secondary and unspecified malignant neoplasm of axilla and upper limb lymph nodes: Secondary | ICD-10-CM

## 2016-10-18 DIAGNOSIS — R112 Nausea with vomiting, unspecified: Secondary | ICD-10-CM

## 2016-10-18 LAB — COMPREHENSIVE METABOLIC PANEL
ALBUMIN: 3.7 g/dL (ref 3.5–5.0)
ALT: 24 U/L (ref 0–55)
AST: 22 U/L (ref 5–34)
Alkaline Phosphatase: 82 U/L (ref 40–150)
Anion Gap: 8 mEq/L (ref 3–11)
BUN: 10.7 mg/dL (ref 7.0–26.0)
CALCIUM: 9.3 mg/dL (ref 8.4–10.4)
CHLORIDE: 106 meq/L (ref 98–109)
CO2: 24 mEq/L (ref 22–29)
CREATININE: 0.7 mg/dL (ref 0.6–1.1)
EGFR: >90 mL/min/{1.73_m2} (ref >90)
GLUCOSE: 111 mg/dL (ref 70–140)
POTASSIUM: 3.9 meq/L (ref 3.5–5.1)
SODIUM: 139 meq/L (ref 136–145)
Total Bilirubin: <0.22 mg/dL (ref 0.20–1.20)
Total Protein: 7.4 g/dL (ref 6.4–8.3)

## 2016-10-18 LAB — CBC WITH DIFFERENTIAL/PLATELET
BASO%: 1.3 % (ref 0.0–2.0)
BASOS ABS: 0.1 10*3/uL (ref 0.0–0.1)
EOS%: 0.4 % (ref 0.0–7.0)
Eosinophils Absolute: 0 10*3/uL (ref 0.0–0.5)
HEMATOCRIT: 39.5 % (ref 34.8–46.6)
HEMOGLOBIN: 13.2 g/dL (ref 11.6–15.9)
LYMPH#: 1.7 10*3/uL (ref 0.9–3.3)
LYMPH%: 22.4 % (ref 14.0–49.7)
MCH: 30.8 pg (ref 25.1–34.0)
MCHC: 33.3 g/dL (ref 31.5–36.0)
MCV: 92.3 fL (ref 79.5–101.0)
MONO#: 0.5 10*3/uL (ref 0.1–0.9)
MONO%: 6.8 % (ref 0.0–14.0)
NEUT#: 5.1 10*3/uL (ref 1.5–6.5)
NEUT%: 69.1 % (ref 38.4–76.8)
Platelets: 201 10*3/uL (ref 145–400)
RBC: 4.28 10*6/uL (ref 3.70–5.45)
RDW: 13.1 % (ref 11.2–14.5)
WBC: 7.5 10*3/uL (ref 3.9–10.3)

## 2016-10-18 LAB — MAGNESIUM: Magnesium: 2 mg/dl (ref 1.5–2.5)

## 2016-10-18 MED ORDER — PALONOSETRON HCL INJECTION 0.25 MG/5ML
INTRAVENOUS | Status: AC
  Filled 2016-10-18: qty 5

## 2016-10-18 MED ORDER — PALONOSETRON HCL INJECTION 0.25 MG/5ML
0.2500 mg | Freq: Once | INTRAVENOUS | Status: AC
  Administered 2016-10-18: 0.25 mg via INTRAVENOUS

## 2016-10-18 MED ORDER — SODIUM CHLORIDE 0.9% FLUSH
10.0000 mL | INTRAVENOUS | Status: DC | PRN
  Administered 2016-10-18: 10 mL
  Filled 2016-10-18: qty 10

## 2016-10-18 MED ORDER — MAGIC MOUTHWASH: 5.0000 mL | Freq: Four times a day (QID) | ORAL | 0 refills | Status: DC | PRN

## 2016-10-18 MED ORDER — HEPARIN SOD (PORK) LOCK FLUSH 100 UNIT/ML IV SOLN
500.0000 [IU] | Freq: Once | INTRAVENOUS | Status: AC | PRN
  Administered 2016-10-18: 500 [IU]
  Filled 2016-10-18: qty 5

## 2016-10-18 MED ORDER — SODIUM CHLORIDE 0.9 % IV SOLN
Freq: Once | INTRAVENOUS | Status: AC
  Administered 2016-10-18: 13:00:00 via INTRAVENOUS
  Filled 2016-10-18: qty 5

## 2016-10-18 MED ORDER — SODIUM CHLORIDE 0.9 % IV SOLN
Freq: Once | INTRAVENOUS | Status: AC
  Administered 2016-10-18: 12:00:00 via INTRAVENOUS

## 2016-10-18 MED ORDER — DOXORUBICIN HCL CHEMO IV INJECTION 2 MG/ML
60.0000 mg/m2 | Freq: Once | INTRAVENOUS | Status: AC
  Administered 2016-10-18: 116 mg via INTRAVENOUS
  Filled 2016-10-18: qty 58

## 2016-10-18 MED ORDER — SODIUM CHLORIDE 0.9 % IV SOLN
600.0000 mg/m2 | Freq: Once | INTRAVENOUS | Status: AC
  Administered 2016-10-18: 1160 mg via INTRAVENOUS
  Filled 2016-10-18: qty 58

## 2016-10-18 NOTE — Patient Instructions (Signed)
Implanted Port Insertion An implanted port is a central line that has a round shape and is placed under the skin. It is used as a long-term IV access for:   Medicines, such as chemotherapy.   Fluids.   Liquid nutrition, such as total parenteral nutrition (TPN).   Blood samples.  LET YOUR HEALTH CARE PROVIDER KNOW ABOUT:  Allergies to food or medicine.   Medicines taken, including vitamins, herbs, eye drops, creams, and over-the-counter medicines.   Any allergies to heparin.  Use of steroids (by mouth or creams).   Previous problems with anesthetics or numbing medicines.   History of bleeding problems or blood clots.   Previous surgery.   Other health problems, including diabetes and kidney problems.   Possibility of pregnancy, if this applies. RISKS AND COMPLICATIONS Generally, this is a safe procedure. However, as with any procedure, problems can occur. Possible problems include:  Damage to the blood vessel, bruising, or bleeding at the puncture site.   Infection.  Blood clot in the vessel that the port is in.  Breakdown of the skin over your port.  Very rarely a person may develop a condition called a pneumothorax, a collection of air in the chest that may cause one of the lungs to collapse. The placement of these catheters with the appropriate imaging guidance significantly decreases the risk of a pneumothorax.  BEFORE THE PROCEDURE   Your health care provider may want you to have blood tests. These tests can help tell how well your kidneys and liver are working. They can also show how well your blood clots.   If you take blood thinners (anticoagulant medicines), ask your health care provider when you should stop taking them.   Make arrangements for someone to drive you home. This is necessary if you have been sedated for your procedure.  PROCEDURE  Port insertion usually takes about 30-45 minutes.   An IV needle will be inserted in your arm.  Medicine for pain and medicine to help relax you (sedative) will flow directly into your body through this needle.   You will lie on an exam table, and you will be connected to monitors to keep track of your heart rate, blood pressure, and breathing throughout the procedure.  An oxygen monitoring device may be attached to your finger. Oxygen will be given.   Everything will be kept as germ free (sterile) as possible during the procedure. The skin near the point of the incision will be cleansed with antiseptic, and the area will be draped with sterile towels. The skin and deeper tissues over the port area will be made numb with a local anesthetic.  Two small cuts (incisions) will be made in the skin to insert the port. One will be made in the neck to obtain access to the vein where the catheter will lie.   Because the port reservoir will be placed under the skin, a small skin incision will be made in the upper chest, and a small pocket for the port will be made under the skin. The catheter that will be connected to the port tunnels to a large central vein in the chest. A small, raised area will remain on your body at the site of the reservoir when the procedure is complete.  The port placement will be done under imaging guidance to ensure the proper placement.  The reservoir has a silicone covering that can be punctured with a special needle.   The port will be flushed with normal   saline, and blood will be drawn to make sure it is working properly.  There will be nothing remaining outside the skin when the procedure is finished.   Incisions will be held together by stitches, surgical glue, or a special tape. AFTER THE PROCEDURE  You will stay in a recovery area until the anesthesia has worn off. Your blood pressure and pulse will be checked.  A final chest X-ray will be taken to check the placement of the port and to ensure that there is no injury to your lung. This information is not  intended to replace advice given to you by your health care provider. Make sure you discuss any questions you have with your health care provider. Document Released: 09/01/2013 Document Revised: 12/02/2014 Document Reviewed: 09/01/2013 Elsevier Interactive Patient Education  2017 [employer]  

## 2016-10-18 NOTE — Telephone Encounter (Signed)
Gave patient avs report and appointment for November and December

## 2016-10-18 NOTE — Patient Instructions (Signed)
[facility] Discharge Instructions for Patients Receiving Chemotherapy  Today you received the following chemotherapy agents:Adriamycin and Cytoxan   To help prevent nausea and vomiting after your treatment, we encourage you to take your nausea medication as directed.    If you develop nausea and vomiting that is not controlled by your nausea medication, call the clinic.   BELOW ARE SYMPTOMS THAT SHOULD BE REPORTED IMMEDIATELY:  *FEVER GREATER THAN 100.5 F  *CHILLS WITH OR WITHOUT FEVER  NAUSEA AND VOMITING THAT IS NOT CONTROLLED WITH YOUR NAUSEA MEDICATION  *UNUSUAL SHORTNESS OF BREATH  *UNUSUAL BRUISING OR BLEEDING  TENDERNESS IN MOUTH AND THROAT WITH OR WITHOUT PRESENCE OF ULCERS  *URINARY PROBLEMS  *BOWEL PROBLEMS  UNUSUAL RASH Items with * indicate a potential emergency and should be followed up as soon as possible.  Feel free to call the clinic you have any questions or concerns. The clinic phone number is [phone].  Please show the CHEMO ALERT CARD at check-in to the Emergency Department and triage nurse.   

## 2016-10-18 NOTE — Progress Notes (Signed)
[place] OFFICE PROGRESS NOTE   Diagnosis:  Right breast cancer Oncology History   Malignant neoplasm of lower-outer quadrant of right female breast [facility])   Staging form: Breast, AJCC 7th Edition   - Clinical stage from 09/12/2016: Stage IIB (T2, N1, M0) - Signed by Truitt Merle, MD on 09/18/2016     Malignant neoplasm of lower-outer quadrant of right female breast ([place])   09/10/2016 Mammogram    Mammogram and ultrasound showed a 2.7 cm mass at 8 clock position of the right breast, there is also a lobulated lymph nodes in the right axilla, slightly suspicious for malignancy.      09/12/2016 Initial Diagnosis    Malignant neoplasm of lower-outer quadrant of right female breast ([place])     09/12/2016 Initial Biopsy    Right breast 8:00 core needle biopsy and right axillary lymph node biopsy showed metastatic ductal carcinoma, node has extracapsular extension, grade 3     09/12/2016 Receptors her2    Your 90-100% positive, PR  90-100% positive, HER-2 negative, Ki-67 15% in breast mass, 70% in node       INTERVAL HISTORY:   [name] returns as scheduled. She completed cycle 1 neoadjuvant Adriamycin/Cytoxan 10/01/2016. She developed nausea/vomiting day 1 upon leaving the office. This lasted for 2-3 days. She took Compazine with partial improvement. She developed a few mouth sores. The mouth sores are better. She developed severe back pain that she attributes to Neulasta. She noted improvement with Percocet. She developed constipation. She began a laxative with good results. She reports having a headache which she describes as her "hair hurting". She was seen in the emergency department on 10/05/2016 after a syncopal episode. She was given IV fluids and antiemetics with improvement. Due to complaints of chest pain chest CT was done with no evidence of pulmonary embolus.  Objective:  Vital signs in last 24 hours:  Blood pressure (!) 120/55, pulse 99,  temperature 98.4 F (36.9 C), temperature source Oral, resp. rate 17, height '5\' 4"'$  (1.626 m), weight 181 lb 3.2 oz (82.2 kg), last menstrual period 08/20/2016, SpO2 99 %.    HEENT: Bilateral angular cheilitis. No ulcers within the oral cavity. No thrush. Resp: Lungs clear bilaterally. Cardio: Regular rate and rhythm. GI: Abdomen soft and nontender. No hepatomegaly. Vascular: No leg edema. Calves soft and nontender. Neuro: Alert and oriented.   Port-A-Cath incision with mild surrounding erythema.    Lab Results:  Lab Results  Component Value Date   WBC 7.5 10/18/2016   HGB 13.2 10/18/2016   HCT 39.5 10/18/2016   MCV 92.3 10/18/2016   PLT 201 10/18/2016   NEUTROABS 5.1 10/18/2016    Imaging:  No results found.  Medications: I have reviewed the patient's current medications.  Assessment/Plan: 1. 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 Genetics. She has been referred to genetics due to her young age and strong family history of breast cancer. Appointment is scheduled 10/24/2016. 3. Anxiety and insomnia. She did not tolerate lorazepam. She will continue Xanax as needed. 4. Nausea/vomiting secondary to chemotherapy. She receives Aloxi/Emend/dexamethasone premedications. She has Compazine at home. She does not tolerate lorazepam. We discussed 3 days of prophylactic dexamethasone which she declines. She understands she can begin Zofran as needed after 72 hours. 5. Mucositis following cycle 1 Adriamycin/Cytoxan. 6. Severe bone pain following Neulasta. Per Dr. Ernestina Penna phone note 10/15/2016 Neulasta  will be held with this cycle. She will return for a nadir CBC.   Disposition: [name] appears stable. She has completed 1 cycle of neoadjuvant Adriamycin/Cytoxan. Plan to proceed with cycle 2 today as scheduled.  She had  significant nausea following cycle 1. As noted above she receives Aloxi/Emend/dexamethasone premedications today. She declines prophylactic dexamethasone. She will take Compazine as needed and understands she can begin Zofran after 72 hours.  She had severe bone pain related to Neulasta. Neulasta will be held with this cycle. She will return for a nadir CBC on 10/28/2016. Neutropenic precautions were reviewed with her at today's visit. She understands to contact the office with signs/symptoms suggestive of infection.  She will return for a follow-up visit and cycle 3 neoadjuvant Adriamycin/Cytoxan on 11/01/2016. She will contact the office in the interim as outlined above or with any other problems.  25 minutes were spent face-to-face at today's visit with the majority of that time involved in counseling/coordination of care.    Ned Card ANP/GNP-BC   10/18/2016  11:17 AM

## 2016-10-24 ENCOUNTER — Other Ambulatory Visit (HOSPITAL_BASED_OUTPATIENT_CLINIC_OR_DEPARTMENT_OTHER): Payer: 59

## 2016-10-24 ENCOUNTER — Ambulatory Visit (HOSPITAL_BASED_OUTPATIENT_CLINIC_OR_DEPARTMENT_OTHER): Payer: 59 | Admitting: Genetic Counselor

## 2016-10-24 ENCOUNTER — Encounter: Payer: Self-pay | Admitting: Genetic Counselor

## 2016-10-24 DIAGNOSIS — Z803 Family history of malignant neoplasm of breast: Secondary | ICD-10-CM

## 2016-10-24 DIAGNOSIS — Z808 Family history of malignant neoplasm of other organs or systems: Secondary | ICD-10-CM

## 2016-10-24 DIAGNOSIS — Z801 Family history of malignant neoplasm of trachea, bronchus and lung: Secondary | ICD-10-CM

## 2016-10-24 DIAGNOSIS — C50511 Malignant neoplasm of lower-outer quadrant of right female breast: Secondary | ICD-10-CM | POA: Diagnosis not present

## 2016-10-24 DIAGNOSIS — Z1379 Encounter for other screening for genetic and chromosomal anomalies: Secondary | ICD-10-CM

## 2016-10-24 DIAGNOSIS — Z800 Family history of malignant neoplasm of digestive organs: Secondary | ICD-10-CM

## 2016-10-24 DIAGNOSIS — Z170 Estrogen receptor positive status [ER+]: Principal | ICD-10-CM

## 2016-10-24 LAB — CBC WITH DIFFERENTIAL/PLATELET
BASO%: 0.2 % (ref 0.0–2.0)
Basophils Absolute: 0 10*3/uL (ref 0.0–0.1)
EOS%: 0.2 % (ref 0.0–7.0)
Eosinophils Absolute: 0 10*3/uL (ref 0.0–0.5)
HEMATOCRIT: 40.2 % (ref 34.8–46.6)
HGB: 13.8 g/dL (ref 11.6–15.9)
LYMPH#: 1.2 10*3/uL (ref 0.9–3.3)
LYMPH%: 28.1 % (ref 14.0–49.7)
MCH: 31.5 pg (ref 25.1–34.0)
MCHC: 34.3 g/dL (ref 31.5–36.0)
MCV: 91.8 fL (ref 79.5–101.0)
MONO#: 0 10*3/uL — ABNORMAL LOW (ref 0.1–0.9)
MONO%: 0.5 % (ref 0.0–14.0)
NEUT#: 3 10*3/uL (ref 1.5–6.5)
NEUT%: 71 % (ref 38.4–76.8)
Platelets: 338 10*3/uL (ref 145–400)
RBC: 4.38 10*6/uL (ref 3.70–5.45)
RDW: 13.2 % (ref 11.2–14.5)
WBC: 4.2 10*3/uL (ref 3.9–10.3)

## 2016-10-24 LAB — COMPREHENSIVE METABOLIC PANEL
ALT: 17 U/L (ref 0–55)
AST: 15 U/L (ref 5–34)
Albumin: 4 g/dL (ref 3.5–5.0)
Alkaline Phosphatase: 80 U/L (ref 40–150)
Anion Gap: 8 mEq/L (ref 3–11)
BUN: 12.9 mg/dL (ref 7.0–26.0)
CALCIUM: 9.6 mg/dL (ref 8.4–10.4)
CHLORIDE: 102 meq/L (ref 98–109)
CO2: 26 meq/L (ref 22–29)
CREATININE: 0.7 mg/dL (ref 0.6–1.1)
EGFR: >90 mL/min/{1.73_m2} (ref >90)
Glucose: 102 mg/dl (ref 70–140)
Potassium: 4.2 mEq/L (ref 3.5–5.1)
Sodium: 136 mEq/L (ref 136–145)
Total Bilirubin: 0.4 mg/dL (ref 0.20–1.20)
Total Protein: 7.7 g/dL (ref 6.4–8.3)

## 2016-10-24 NOTE — Progress Notes (Signed)
REFERRING PROVIDER: No referring provider defined for this encounter.  PRIMARY PROVIDER:  No PCP Per Patient  PRIMARY REASON FOR VISIT:  1. Malignant neoplasm of lower-outer quadrant of right female breast, unspecified estrogen receptor status ([place])   2. Family history of breast cancer in female   3. Family history of colon cancer   4. Family history of brain cancer   5. Family history of skin cancer   6. Family history of lung cancer      HISTORY OF PRESENT ILLNESS:   Ms. Level, a 50 y.o. female, was seen for a [place] cancer genetics consultation at the request of Dr. Burr Medico due to a personal history of breast cancer at 49 and family history of breast and other cancers.  Ms. Ertle presents to clinic today to discuss the possibility of a hereditary predisposition to cancer, genetic testing, and to further clarify her future cancer risks, as well as potential cancer risks for family members.   In October 2017, at the age of 56, Ms. Irani was diagnosed with invasive ductal carcinoma of the right breast.  Hormone receptor status was ER/PR+ and Her2-.  Ms. Kovack is currently undergoing chemotherapy, which will be finished in May.  Genetic testing will help inform her surgical and treatment decisions.  Ms. Riche reports no additional personal history of cancer.  She has some concerns for a couple of abnormal skin areas on her left and right arms for which she has a dermatology appointment scheduled for December 12th.   CANCER HISTORY:  Oncology History   Malignant neoplasm of lower-outer quadrant of right female breast [facility])   Staging form: Breast, AJCC 7th Edition   - Clinical stage from 09/12/2016: Stage IIB (T2, N1, M0) - Signed by Truitt Merle, MD on 09/18/2016      Malignant neoplasm of lower-outer quadrant of right female breast ([place])   09/10/2016 Mammogram    Mammogram and ultrasound showed a 2.7 cm mass at 8 clock position of the right breast, there is also a lobulated lymph nodes  in the right axilla, slightly suspicious for malignancy.       09/12/2016 Initial Diagnosis    Malignant neoplasm of lower-outer quadrant of right female breast ([place])      09/12/2016 Initial Biopsy    Right breast 8:00 core needle biopsy and right axillary lymph node biopsy showed metastatic ductal carcinoma, node has extracapsular extension, grade 3      09/12/2016 Receptors her2    Your 90-100% positive, PR  90-100% positive, HER-2 negative, Ki-67 15% in breast mass, 70% in node         HORMONAL RISK FACTORS:  Menarche was at age 63.  First live birth at age 13.  OCP use for - tried for a short time. Ovaries intact: yes.  Hysterectomy: no.  Menopausal status: premenopausal.  HRT use: 0 years. Colonoscopy: no; not examined. Mammogram within the last year: this was her first mammogram. Number of breast biopsies: 1. Up to date with pelvic exams:  no. Any excessive radiation exposure/other exposures in the past:  Worked in [place], but wore a badge to monitor radiation levels; reports some secondhand smoke exposure (parents smoked)  Past Medical History:  Diagnosis Date  . Fracture, sacrum/coccyx ([place]) 1970   "Shattered tailbone"  . Heart murmur    [facility] has evaluated, felt benign  . Malignant neoplasm of lower-outer quadrant of right female breast ([place]) 09/13/2016  . Tachycardia    Intermittent, resolved with decreased caffeine  intake    Past Surgical History:  Procedure Laterality Date  . APPENDECTOMY    . PORTACATH PLACEMENT Right 09/23/2016   Procedure: INSERTION PORT-A-CATH WITH [place];  Surgeon: Rolm Bookbinder, MD;  Location: [place];  Service: General;  Laterality: Right;  . TONSILECTOMY/ADENOIDECTOMY WITH MYRINGOTOMY    . TUBAL LIGATION      Social History   Social History  . Marital status: Widowed    Spouse name: N/A  . Number of children: 5  . Years of education: N/A   Occupational History  . 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 Smoking status: Never Smoker  . Smokeless tobacco: Never Used  . Alcohol use Yes     Comment: social drinker   . Drug use: No  . Sexual activity: Not Asked   Other Topics Concern  . None   Social History Narrative  . None     FAMILY HISTORY:  We obtained a detailed, 4-generation family history.  Significant diagnoses are listed below: Family History  Problem Relation Age of Onset  . Diabetes Mother   . Diabetes Father   . Skin cancer Father 48    NOS type; worked as a Theme park manager for many years  . Hypertension Other   . Breast cancer Cousin 66    paternal 1st cousin; w/ mets to bone  . Breast cancer Cousin     paternal 1st cousin dx 67-50; s/p lump  . Breast cancer Cousin     paternal 1st cousin dx 72-53 w/ mets to LN; s/p BL mastectomies  . Heart disease Maternal Aunt 58  . Colon cancer Paternal Uncle     dx 67-68  . Goiter Maternal Grandmother     d. bled to death following goiter surgery  . Heart attack Maternal Grandfather     d. early 68s  . Lung cancer Paternal Grandmother     dx 3s; d. 88y  . Diabetes Paternal Grandmother   . Heart failure Paternal Grandfather     d. 75-76  . Alzheimer's disease Paternal Grandfather   . Other Daughter 83    daughter w/ hx benign breast lump/cyst removed  . Epilepsy Maternal Aunt   . Alzheimer's disease Paternal Aunt   . Brain cancer Cousin 41    paternal 1st cousin; d. 55y; NOS type    Ms. Stlaurent has five daughters, ages 29-30.  Her oldest daughter had a benign lumpectomy at age 19.  Ms. Padin currently has one grandson and one granddaughter.  She has one full sister who is currently 40 and has not had cancer.  Her sister has two sons of her own.  Ms. Humphres parents are both currently 31.  Her father has a history of skin cancer diagnosed at age 28.  She reports that her often worked in the sun due to his work as a Theme park manager in [employer] for many years.    Ms. Macfadden mother has two  full sisters and two full brothers.  One sister died from heart disease at 109.  The other sister is currently 24-67 and has a history of epilepsy.  One brother passed away at a "young" age from an unspecified cause.  This was the only sibling to have children--he had two daughters who are cancer-free.  The other brother is currently in his early 43s and has never had cancer.  Ms. Assefa grandmother passed away when her mother was only  74 years old.  She had a goiter for which she underwent surgery; she passed away following her surgery when she "bled to death".  Ms. Meikle's maternal grandfather passed away from a heart attack in his early 77s.  Ms. Eber has no information for any maternal great aunts/uncles and great grandparents.  Ms. Espericueta father has three full brothers and three full sisters, all of whom are currently in their 3s-80s.  One brother was diagnosed with colon cancer at age 36-68.  The other siblings have not had cancer, to Ms. Sens's knowledge.  Three of Ms. Kipp's paternal first cousins have had breast cancer, all diagnosed in their mid-57s to early 41s.  These cousins are daughters of Ms. Hobson's uncles.  One paternal first cousin was diagnosed with an unspecified type of brain cancer at 54 and passed away at 38.  Ms. Abbs's paternal grandmother was diagnosed with and passed away from lung cancer in her 53s.  Ms. Greenblatt's paternal grandfather passed away from heart failure and alzheimer's-related causes at age 47-76.  Ms. Kawahara has limited information for her paternal great aunts/uncles and great grandparents.  Ms. Trulson is unaware of any previous family history of genetic testing for hereditary cancer risks.  Patient's maternal ancestors are of Caucasian and Native American descent, and paternal ancestors are of Biomedical engineer and English descent. There is no reported Ashkenazi Jewish ancestry. There is no known consanguinity.  GENETIC COUNSELING ASSESSMENT: SHAVY BEACHEM is a 50  y.o. female with a personal and family history of early-onset breast cancer which is somewhat suggestive of a hereditary breast cancer syndrome and predisposition to cancer. We, therefore, discussed and recommended the following at today's visit.   DISCUSSION: We reviewed the characteristics, features and inheritance patterns of hereditary cancer syndromes, particularly those caused by mutations within the BRCA1/2 genes. We also discussed genetic testing, including the appropriate family members to test, the process of testing, insurance coverage and turn-around-time for results. We discussed the implications of a negative, positive and/or variant of uncertain significant result. We recommended Ms. Ludemann pursue genetic testing for the 20-gene Breast/Ovarian Cancer Panel through [facility].  The Breast/Ovarian Cancer Panel offered by [facility] Hope Pigeon, MD) includes sequencing and deletion/duplication analysis for the following 19 genes:  ATM, BARD1, BRCA1, BRCA2, BRIP1, CDH1, CHEK2, FANCC, MLH1, MSH2, MSH6, NBN, PALB2, PMS2, PTEN, RAD51C, RAD51D, TP53, and XRCC2.  This panel also includes deletion/duplication analysis (without sequencing) for one gene, EPCAM.   Based on Ms. Crysler's personal and family history of cancer, she meets medical criteria for genetic testing. Despite that she meets criteria, she may still have an out of pocket cost. We discussed that if her out of pocket cost for testing is over $100, the laboratory will call and confirm whether she wants to proceed with testing.  If the out of pocket cost of testing is less than $100 she will be billed by the genetic testing laboratory.   PLAN: After considering the risks, benefits, and limitations, Ms. Gaulin  provided informed consent to pursue genetic testing and the blood sample was sent to [facility] for analysis of the 20-gene Breast/Ovarian Cancer Panel. Results should be available within approximately 2-3 weeks'  time, at which point they will be disclosed by telephone to Ms. Laurance Flatten, as will any additional recommendations warranted by these results. Ms. Mahmood will receive a summary of her genetic counseling visit and a copy of her results once available. This information will also be available in Epic. We encouraged Ms.  Harkleroad to remain in contact with cancer genetics annually so that we can continuously update the family history and inform her of any changes in cancer genetics and testing that may be of benefit for her family. Ms. Calles questions were answered to her satisfaction today. Our contact information was provided should additional questions or concerns arise.  Thank you for the referral and allowing [place] to share in the care of your patient.   Jeanine Luz, MS, [facility] Certified Genetic Counselor [email or web address] Phone: [phone]  The patient was seen for a total of 60 minutes in face-to-face genetic counseling.  This patient was discussed with Drs. Magrinat, Lindi Adie and/or Burr Medico who agrees with the above.    _______________________________________________________________________ For Office Staff:  Number of people involved in session: 1 Was an Intern/ student involved with case: no

## 2016-10-25 ENCOUNTER — Other Ambulatory Visit: Payer: Self-pay | Admitting: Hematology

## 2016-10-25 DIAGNOSIS — C50511 Malignant neoplasm of lower-outer quadrant of right female breast: Secondary | ICD-10-CM

## 2016-10-25 DIAGNOSIS — Z170 Estrogen receptor positive status [ER+]: Principal | ICD-10-CM

## 2016-10-25 MED ORDER — ONDANSETRON HCL 8 MG PO TABS: 8.0000 mg | ORAL_TABLET | Freq: Two times a day (BID) | ORAL | 1 refills | Status: DC | PRN

## 2016-10-25 MED ORDER — PROCHLORPERAZINE MALEATE 10 MG PO TABS: 10.0000 mg | ORAL_TABLET | Freq: Four times a day (QID) | ORAL | 1 refills | Status: DC | PRN

## 2016-10-25 MED ORDER — ALPRAZOLAM 0.5 MG PO TABS: 0.5000 mg | ORAL_TABLET | Freq: Every evening | ORAL | 0 refills | Status: DC | PRN

## 2016-10-25 MED ORDER — OXYCODONE-ACETAMINOPHEN 5-325 MG PO TABS: 1.0000 | ORAL_TABLET | Freq: Three times a day (TID) | ORAL | 0 refills | Status: DC | PRN

## 2016-10-25 MED ORDER — ZOLPIDEM TARTRATE 5 MG PO TABS: 5.0000 mg | ORAL_TABLET | Freq: Every evening | ORAL | 0 refills | Status: DC | PRN

## 2016-10-28 ENCOUNTER — Other Ambulatory Visit: Payer: 59

## 2016-10-31 ENCOUNTER — Telehealth: Payer: Self-pay | Admitting: *Deleted

## 2016-10-31 NOTE — Telephone Encounter (Signed)
This is [id number] calling for orders for Xanax and Ambien that was sent to store number # [phone] and she no longer uses this store.  Did not pick these up and can't be transferred because they're controlled substances."  Orders given via phone to pharmacist at this time.  Updated preferred pharmacy list.

## 2016-10-31 NOTE — Progress Notes (Signed)
[facility]  [phone] [phone]  Clinic Follow up Note   Patient Care Team: No Pcp Per Patient as PCP - General (General Practice) Rolm Bookbinder, MD as Consulting Physician (General Surgery) Truitt Merle, MD as Consulting Physician (Hematology) Kyung Rudd, MD as Consulting Physician (Radiation Oncology) 11/01/2016  CHIEF COMPLAINTS:  Follow up right breast cancer  Oncology History   Malignant neoplasm of lower-outer quadrant of right female breast [facility])   Staging form: Breast, AJCC 7th Edition   - Clinical stage from 09/12/2016: Stage IIB (T2, N1, M0) - Signed by Truitt Merle, MD on 09/18/2016      Malignant neoplasm of lower-outer quadrant of right female breast ([place])   09/10/2016 Mammogram    Mammogram and ultrasound showed a 2.7 cm mass at 8 clock position of the right breast, there is also a lobulated lymph nodes in the right axilla, slightly suspicious for malignancy.       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 female is here because of her recently diagnosed right breast cancer. She is accompanied by her boyfriend to our multidisciplinary breast clinic today.   She noticed a right breast lump 3 monthsa ago, no pain or tenderness, no skin change or nipple discharge. She feels the lump has been getting bigger over the past  3 months. She called her gynecologist and was finally seen a few weeks ago. She was referred to have a diagnostic mammogram and ultrasound which showed a 2.7 cm mass at the 8:00 position of the right breast, and also a lobulated lymph nodes in the right axilla. 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  She feels well, denies any skin pain, or other symptoms. No recent weight loss. 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 Her husband died from small cell cancer several years ago, she has 5 daughters. She recently found out that 3 of her paternal cousins had breast cancer.  GYN HISTORY  Menarchal: 17 LMP: regular  Contraceptive: no  HRT: n/a  G5P5: 5 daughters 30-19  CURRENT THERAPY: Neoadjuvant Adriamycin and Cytoxan, every 2 weeks, with Neulasta, for total 4 cycles, followed by weekly Taxol for 12 weeks  INTERIM HISTORY: Latania returns for follow-up and treatment. She has a head cold that has been present for the last two days. She is taking Dayquil. She has a little cough but not bad, with a little drainage. She reports congestion and cold sweats. Her pain is much better since not taking Neulasta. She complains of tingling/numb sensation around the edges of her fingertips and toes. She has been wearing thick socks which have helped a  lot. She has no difficulty using her hands. She reports taste change where all foods taste like metal. She has been drinking Ensure and eating a lot of soups. She has been taking the nausea pill regularly because smells often cause her to become nauseated. She throws up for about three days after taking chemotherapy and then it resolves. She is not taking dexamethasone. She is not interested in taking any more medications. Mouth sores are much better with this cycle, she has been  using bacitracin. She has received a flu shot this year.    MEDICAL HISTORY:  Past Medical History:  Diagnosis Date  . Fracture, sacrum/coccyx ([place]) 1970   "Shattered tailbone"  . Heart murmur    [facility] has evaluated, felt benign  . Malignant neoplasm of lower-outer quadrant of right female breast ([place]) 09/13/2016  . Tachycardia    Intermittent, resolved with decreased caffeine intake    SURGICAL HISTORY: Past Surgical History:  Procedure Laterality Date  . APPENDECTOMY    . PORTACATH PLACEMENT Right 09/23/2016   Procedure: INSERTION PORT-A-CATH WITH [place];  Surgeon: Rolm Bookbinder, MD;  Location: [facility];  Service: General;  Laterality: Right;  . TONSILECTOMY/ADENOIDECTOMY WITH MYRINGOTOMY    . TUBAL LIGATION      SOCIAL HISTORY: Social History   Social History  . Marital status: Widowed    Spouse name: N/A  . Number of children: 5  . Years of education: N/A   Occupational History  . RN     OR trauma nurse   Social History Main Topics  . Smoking status: Never Smoker  . Smokeless tobacco: Never Used  . Alcohol use Yes     Comment: social drinker   . Drug use: No  . Sexual activity: Not on file   Other Topics Concern  . Not on file   Social History Narrative  . No narrative on file    FAMILY HISTORY: Family History  Problem Relation Age of Onset  . Diabetes Mother   . Diabetes Father   . Skin cancer Father 70    NOS type; worked as a Theme park manager for many years  . Hypertension Other   . Breast cancer Cousin 56    paternal 1st cousin; w/ mets to bone  . Breast cancer Cousin     paternal 1st cousin dx 16-50; s/p lump  . Breast cancer Cousin     paternal 1st cousin dx 64-53 w/ mets to LN; s/p BL mastectomies  . Heart disease Maternal Aunt 58  . Colon cancer Paternal Uncle     dx 67-68  . Goiter Maternal Grandmother     d. bled to death following goiter surgery  . Heart attack Maternal Grandfather     d. early 46s    . Lung cancer Paternal Grandmother     dx 37s; d. 88y  . Diabetes Paternal Grandmother   . Heart failure Paternal Grandfather     d. 75-76  . Alzheimer's disease Paternal Grandfather   . Other Daughter 89    daughter w/ hx benign breast lump/cyst removed  . Epilepsy Maternal Aunt   . Alzheimer's disease Paternal Aunt   . Brain cancer Cousin 35    paternal 1st cousin; d. 45y; NOS type    ALLERGIES:  is allergic to banana; sulfur; and tramadol.  MEDICATIONS:  Current Outpatient Prescriptions  Medication Sig Dispense Refill  . ALPRAZolam (XANAX) 0.5 MG tablet Take 1 tablet (0.5 mg total) by mouth at bedtime  as needed for anxiety. 20 tablet 0  . docusate sodium (COLACE) 50 MG capsule Take 50 mg by mouth 2 (two) times daily.    Marland Kitchen ibuprofen (ADVIL,MOTRIN) 200 MG tablet Take 800 mg by mouth every 6 (six) hours as needed for headache, mild pain or moderate pain.    Marland Kitchen LORazepam (ATIVAN) 0.5 MG tablet Take 0.5 mg by mouth every 6 (six) hours as needed for anxiety (and-or nausea/vomiting).    . magic mouthwash SOLN Take 5 mLs by mouth 4 (four) times daily as needed for mouth pain. 240 mL 0  . ondansetron (ZOFRAN) 8 MG tablet Take 1 tablet (8 mg total) by mouth 2 (two) times daily as needed for nausea or vomiting. 20 tablet 1  . oxyCODONE-acetaminophen (PERCOCET/ROXICET) 5-325 MG tablet Take 1-2 tablets by mouth every 8 (eight) hours as needed for severe pain. 30 tablet 0  . prochlorperazine (COMPAZINE) 10 MG tablet Take 1 tablet (10 mg total) by mouth every 6 (six) hours as needed for nausea or vomiting. 30 tablet 1  . zolpidem (AMBIEN) 5 MG tablet Take 1 tablet (5 mg total) by mouth at bedtime as needed for sleep. 15 tablet 0   No current facility-administered medications for this visit.     REVIEW OF SYSTEMS:   Constitutional: Denies fevers, chills (+) cold sweats Eyes: Denies blurriness of vision, double vision or watery eyes Ears, nose, mouth, throat, and face: Denies mucositis or sore  throat (+) congestion and drainage Respiratory: Denies dyspnea or wheezes (+) slight cough Cardiovascular: Denies palpitation, chest discomfort or lower extremity swelling Gastrointestinal:  Denies nausea, heartburn or change in bowel habits Skin: Denies abnormal skin rashes Lymphatics: Denies new lymphadenopathy or easy bruising Neurological:Denies new weaknesses (+) tingling/numbness on edges of fingertips and toes Behavioral/Psych: Mood is stable, no new changes  All other systems were reviewed with the patient and are negative.  PHYSICAL EXAMINATION: ECOG PERFORMANCE STATUS: 1  Vitals:   11/01/16 1128  BP: 125/67  Pulse: 83  Resp: 18  Temp: 98.5 F (36.9 C)   Filed Weights   11/01/16 1128  Weight: 177 lb 11.2 oz (80.6 kg)    GENERAL:alert, no distress and comfortable SKIN: skin color, texture, turgor are normal, no rashes or significant lesions EYES: normal, conjunctiva are pink and non-injected, sclera clear OROPHARYNX:no exudate, no erythema and lips, buccal mucosa, and tongue normal  NECK: supple, thyroid normal size, non-tender, without nodularity LYMPH:  no palpable lymphadenopathy in the cervical, axillary or inguinal LUNGS: clear to auscultation and percussion with normal breathing effort HEART: regular rate & rhythm and no murmurs and no lower extremity edema ABDOMEN:abdomen soft, non-tender and normal bowel sounds Musculoskeletal:no cyanosis of digits and no clubbing  PSYCH: alert & oriented x 3 with fluent speech NEURO: no focal motor/sensory deficits   LABORATORY DATA:  I have reviewed the data as listed CBC Latest Ref Rng & Units 11/01/2016 10/24/2016 10/18/2016  WBC 3.9 - 10.3 10e3/uL 2.3(L) 4.2 7.5  Hemoglobin 11.6 - 15.9 g/dL 12.8 13.8 13.2  Hematocrit 34.8 - 46.6 % 37.7 40.2 39.5  Platelets 145 - 400 10e3/uL 158 338 201   CMP Latest Ref Rng & Units 11/01/2016 10/24/2016 10/18/2016  Glucose 70 - 140 mg/dl 125 102 111  BUN 7.0 - 26.0 mg/dL 11.0 12.9  10.7  Creatinine 0.6 - 1.1 mg/dL 0.7 0.7 0.7  Sodium 136 - 145 mEq/L 141 136 139  Potassium 3.5 - 5.1 mEq/L 3.5 4.2 3.9  Chloride 101 - 111 mmol/L - - -  CO2 22 - 29 mEq/L _0 Calcium 8.4 - 10.4 mg/dL 9.3 9.6 9.3  Total Protein 6.4 - 8.3 g/dL 7.5 7.7 7.4  Total Bilirubin 0.20 - 1.20 mg/dL <0.22 0.40 <0.22  Alkaline Phos 40 - 150 U/L 77 80 82  AST 5 - 34 U/L _1 ALT 0 - 55 U/L _2 PATHOLOGY REPORTS: Diagnosis 09/12/2016 1. Lymph node, needle/core biopsy, right axilla - METASTATIC CARCINOMA WITH EXTRACAPSULAR EXTENSION. 2. Breast, right, needle core biopsy, 8 o'clock - INVASIVE DUCTAL CARCINOMA, GRADE 3.  Microscopic Comment 2. The findings are consistent with grade 3 ductal carcinoma. Breast prognostic profile will be performed.  1. PROGNOSTIC INDICATORS Results: IMMUNOHISTOCHEMICAL AND MORPHOMETRIC ANALYSIS PERFORMED MANUALLY Estrogen Receptor: 100%, POSITIVE, STRONG STAINING INTENSITY Progesterone Receptor: 100%, POSITIVE, STRONG STAINING INTENSITY Proliferation Marker Ki67: 15%  Results: HER2 - NEGATIVE RATIO OF HER2/CEP17 SIGNALS 1.52 AVERAGE HER2 COPY NUMBER PER CELL 2.35  2. PROGNOSTIC INDICATORS Results: IMMUNOHISTOCHEMICAL AND MORPHOMETRIC ANALYSIS PERFORMED MANUALLY Estrogen Receptor: 90%, POSITIVE, STRONG STAINING INTENSITY Progesterone Receptor: 90%,  Proliferation Marker Ki67: 70%  Results: HER2 - NEGATIVE RATIO OF HER2/CEP17 SIGNALS 1.09 AVERAGE HER2 COPY NUMBER PER CELL 1.90  RADIOGRAPHIC STUDIES: I have personally reviewed the radiological images as listed and agreed with the findings in the report. Dg Chest 2 View  Result Date: 10/05/2016 CLINICAL DATA:  Initial encounter. 50 year old female with syncope and sternal chest pain. Nausea. EXAM: CHEST  2 VIEW COMPARISON:  09/23/2016 FINDINGS: AP and lateral views of the chest were obtained. The lungs are clear wiithout focal pneumonia, edema, pneumothorax or pleural effusion.  Interstitial markings are diffusely coarsened with chronic features. Cardiopericardial silhouette is at upper limits of normal for size. The visualized bony structures of the thorax are intact. Telemetry leads overlie the chest. Right Port-A-Cath tip overlies the mid SVC. IMPRESSION: No active cardiopulmonary disease. Electronically Signed   By: Misty Stanley M.D.   On: 10/05/2016 18:15   Ct Angio Chest Pe W And/or Wo Contrast  Result Date: 10/05/2016 CLINICAL DATA:  Syncopal episode and entered today. Central chest pain. Nausea and dizziness. Stage III breast cancer on chemotherapy. EXAM: CT ANGIOGRAPHY CHEST WITH CONTRAST TECHNIQUE: Multidetector CT imaging of the chest was performed using the standard protocol during bolus administration of intravenous contrast. Multiplanar CT image reconstructions and MIPs were obtained to evaluate the vascular anatomy. CONTRAST:  60 mL Isovue 370 COMPARISON:  None. FINDINGS: Cardiovascular: Satisfactory opacification of the pulmonary arteries to the segmental level. No evidence of pulmonary embolism. Normal heart size. No pericardial effusion. Mediastinum/Nodes: No enlarged mediastinal, hilar, or axillary lymph nodes. Thyroid gland, trachea, and esophagus demonstrate no significant findings. Lungs/Pleura: Evaluation is limited due to respiratory artifact. Scattered emphysematous changes in the lungs. Mild dependent atelectasis. No focal airspace disease or consolidation. No significant pulmonary nodules. No pleural effusions. No pneumothorax. Upper Abdomen: No acute abnormality. Musculoskeletal: No destructive bone lesions. Right central venous catheter with tip in the SVC. 2.1 cm diameter somewhat poorly defined soft tissue lesion in the right breast. This may be related to patient's history of breast cancer. Mammographic correlation is suggested if clinically indicated. Review of the MIP images confirms the above findings. IMPRESSION: No evidence of significant pulmonary  embolus. No evidence of active pulmonary disease. No evidence of metastasis in the chest. Electronically Signed   By: Lucienne Capers M.D.   On: 10/05/2016 21:05    ASSESSMENT & PLAN:  49 year old premenopausal Caucasian female, presented with a  palpable right breast mass.  1. 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 -Due to the clinically rapid growing tumor, moderate size, positive lymph nodes, high-grade, high Ki-67, this is likely a aggressive tumor, the risk of recurrence after surgery is likely high. -I recommend her to consider neoadjuvant chemotherapy, to shrink the tumor, make the lumpectomy more feasible, and potentially avoid axillary lymph node dissection. -Giving the ER/PR strongly positive, HER-2 negative disease, I recommend her to have dose dense Adriamycin and Cytoxan, every 2 weeks for 4 cycles, followed by weekly Taxol for 12 weeks, as neoadjuvant therapy. -She tolerated the second cycle AC better without Neulasta --Labs reviewed with patient, she has neutropenia with ANC 0.9, she also has cold symptoms. We'll hold cycle 3 AC for 1 week to allow cold symptoms to resolve -Potential side effects from chemotherapy reviewed with her again, she knows to call us if she has questions. -Consider switching to neupogen from neulasta because of bone pain   2. Genetics  -Her young age and strong family history of breast cancer, we'll refer her to his genetics for testing, to rule out inheritable genetic syndrome.  3. Anxiety  and insomnia -She has been quite anxious since cancer diagnosis, but does not feel she needs medication -She has tried Ambien, do not  like it  -I gave her a prescription of lorazepam 0.5 mg as needed for insomnia and anxiety    4. Cold symptoms -ANC 0.9K today. No fever or productive cough, will hold on antibiotics now.  She will contact our clinic if she develops fever or worsening symptoms.  Plan  -Lab reviewed with patient, neutropenia with ANC 0.9K -Will hold neoadjuvant chemotherapy C3 AC for 1 week to allow time for cold symptoms to resolve -Consider add Granix daily X2-3 after next cycle chemo  -She'll return in 1 week for cycle 3 AC -I will see her back in 3 weeks before C4 AC    No orders of the defined types were placed in this encounter.   All questions were answered. The patient knows to call the clinic with any problems, questions or concerns.  I spent 20 minutes counseling the patient face to face. The total time spent in the appointment was 25 minutes and more than 50% was on counseling.  This document serves as a record of services personally performed by Truitt Merle, MD. It was created on her behalf by Arlyce Harman, a trained medical scribe. The creation of this record is based on the scribe's personal observations and the provider's statements to them. This document has been checked and approved by the attending provider.     Truitt Merle, MD 11/01/2016

## 2016-11-01 ENCOUNTER — Other Ambulatory Visit (HOSPITAL_BASED_OUTPATIENT_CLINIC_OR_DEPARTMENT_OTHER): Payer: 59

## 2016-11-01 ENCOUNTER — Ambulatory Visit: Payer: 59

## 2016-11-01 ENCOUNTER — Encounter: Payer: Self-pay | Admitting: *Deleted

## 2016-11-01 ENCOUNTER — Encounter: Payer: Self-pay | Admitting: Hematology

## 2016-11-01 ENCOUNTER — Ambulatory Visit (HOSPITAL_BASED_OUTPATIENT_CLINIC_OR_DEPARTMENT_OTHER): Payer: 59 | Admitting: Hematology

## 2016-11-01 VITALS — BP 125/67 | HR 83 | Temp 98.5°F | Resp 18 | Ht 64.0 in | Wt 177.7 lb

## 2016-11-01 DIAGNOSIS — Z95828 Presence of other vascular implants and grafts: Secondary | ICD-10-CM | POA: Insufficient documentation

## 2016-11-01 DIAGNOSIS — Z170 Estrogen receptor positive status [ER+]: Secondary | ICD-10-CM | POA: Diagnosis not present

## 2016-11-01 DIAGNOSIS — F419 Anxiety disorder, unspecified: Secondary | ICD-10-CM | POA: Diagnosis not present

## 2016-11-01 DIAGNOSIS — G4700 Insomnia, unspecified: Secondary | ICD-10-CM | POA: Diagnosis not present

## 2016-11-01 DIAGNOSIS — C773 Secondary and unspecified malignant neoplasm of axilla and upper limb lymph nodes: Secondary | ICD-10-CM

## 2016-11-01 DIAGNOSIS — C50511 Malignant neoplasm of lower-outer quadrant of right female breast: Secondary | ICD-10-CM

## 2016-11-01 LAB — COMPREHENSIVE METABOLIC PANEL
ALT: 21 U/L (ref 0–55)
AST: 18 U/L (ref 5–34)
Albumin: 3.8 g/dL (ref 3.5–5.0)
Alkaline Phosphatase: 77 U/L (ref 40–150)
Anion Gap: 10 mEq/L (ref 3–11)
BUN: 11 mg/dL (ref 7.0–26.0)
CHLORIDE: 105 meq/L (ref 98–109)
CO2: 25 meq/L (ref 22–29)
CREATININE: 0.7 mg/dL (ref 0.6–1.1)
Calcium: 9.3 mg/dL (ref 8.4–10.4)
EGFR: >90 mL/min/{1.73_m2} (ref >90)
Glucose: 125 mg/dl (ref 70–140)
POTASSIUM: 3.5 meq/L (ref 3.5–5.1)
Sodium: 141 mEq/L (ref 136–145)
Total Bilirubin: <0.22 mg/dL (ref 0.20–1.20)
Total Protein: 7.5 g/dL (ref 6.4–8.3)

## 2016-11-01 LAB — CBC WITH DIFFERENTIAL/PLATELET
BASO%: 0.7 % (ref 0.0–2.0)
Basophils Absolute: 0 10*3/uL (ref 0.0–0.1)
EOS%: 1.7 % (ref 0.0–7.0)
Eosinophils Absolute: 0 10*3/uL (ref 0.0–0.5)
HCT: 37.7 % (ref 34.8–46.6)
HGB: 12.8 g/dL (ref 11.6–15.9)
LYMPH%: 45.8 % (ref 14.0–49.7)
MCH: 31.3 pg (ref 25.1–34.0)
MCHC: 33.9 g/dL (ref 31.5–36.0)
MCV: 92.1 fL (ref 79.5–101.0)
MONO#: 0.3 10*3/uL (ref 0.1–0.9)
MONO%: 13.6 % (ref 0.0–14.0)
NEUT#: 0.9 10*3/uL — ABNORMAL LOW (ref 1.5–6.5)
NEUT%: 38.2 % — AB (ref 38.4–76.8)
Platelets: 158 10*3/uL (ref 145–400)
RBC: 4.09 10*6/uL (ref 3.70–5.45)
RDW: 13.7 % (ref 11.2–14.5)
WBC: 2.3 10*3/uL — ABNORMAL LOW (ref 3.9–10.3)
lymph#: 1 10*3/uL (ref 0.9–3.3)

## 2016-11-01 MED ORDER — SODIUM CHLORIDE 0.9 % IJ SOLN
10.0000 mL | Freq: Once | INTRAMUSCULAR | Status: AC
  Administered 2016-11-01: 10 mL
  Filled 2016-11-01: qty 10

## 2016-11-01 MED ORDER — SODIUM CHLORIDE 0.9% FLUSH
10.0000 mL | INTRAVENOUS | Status: DC | PRN
  Administered 2016-11-01: 10 mL via INTRAVENOUS
  Filled 2016-11-01: qty 10

## 2016-11-01 MED ORDER — HEPARIN SOD (PORK) LOCK FLUSH 100 UNIT/ML IV SOLN
500.0000 [IU] | Freq: Once | INTRAVENOUS | Status: AC
  Administered 2016-11-01: 500 [IU]
  Filled 2016-11-01: qty 5

## 2016-11-08 ENCOUNTER — Ambulatory Visit (HOSPITAL_BASED_OUTPATIENT_CLINIC_OR_DEPARTMENT_OTHER): Payer: 59

## 2016-11-08 ENCOUNTER — Ambulatory Visit: Payer: 59

## 2016-11-08 ENCOUNTER — Other Ambulatory Visit (HOSPITAL_BASED_OUTPATIENT_CLINIC_OR_DEPARTMENT_OTHER): Payer: 59

## 2016-11-08 ENCOUNTER — Other Ambulatory Visit: Payer: Self-pay | Admitting: *Deleted

## 2016-11-08 VITALS — BP 123/44 | HR 79 | Temp 98.4°F | Resp 17

## 2016-11-08 DIAGNOSIS — Z95828 Presence of other vascular implants and grafts: Secondary | ICD-10-CM

## 2016-11-08 DIAGNOSIS — Z170 Estrogen receptor positive status [ER+]: Principal | ICD-10-CM

## 2016-11-08 DIAGNOSIS — C50511 Malignant neoplasm of lower-outer quadrant of right female breast: Secondary | ICD-10-CM | POA: Diagnosis not present

## 2016-11-08 DIAGNOSIS — Z5111 Encounter for antineoplastic chemotherapy: Secondary | ICD-10-CM

## 2016-11-08 DIAGNOSIS — C773 Secondary and unspecified malignant neoplasm of axilla and upper limb lymph nodes: Secondary | ICD-10-CM | POA: Diagnosis not present

## 2016-11-08 LAB — COMPREHENSIVE METABOLIC PANEL
ALT: 20 U/L (ref 0–55)
ANION GAP: 11 meq/L (ref 3–11)
AST: 16 U/L (ref 5–34)
Albumin: 3.8 g/dL (ref 3.5–5.0)
Alkaline Phosphatase: 83 U/L (ref 40–150)
BUN: 11.9 mg/dL (ref 7.0–26.0)
CHLORIDE: 106 meq/L (ref 98–109)
CO2: 24 meq/L (ref 22–29)
CREATININE: 0.7 mg/dL (ref 0.6–1.1)
Calcium: 9.3 mg/dL (ref 8.4–10.4)
Glucose: 164 mg/dl — ABNORMAL HIGH (ref 70–140)
Potassium: 3.6 mEq/L (ref 3.5–5.1)
Sodium: 140 mEq/L (ref 136–145)
Total Bilirubin: 0.22 mg/dL (ref 0.20–1.20)
Total Protein: 7.7 g/dL (ref 6.4–8.3)

## 2016-11-08 LAB — CBC WITH DIFFERENTIAL/PLATELET
BASO%: 0.5 % (ref 0.0–2.0)
Basophils Absolute: 0 10*3/uL (ref 0.0–0.1)
EOS%: 1.5 % (ref 0.0–7.0)
Eosinophils Absolute: 0.1 10*3/uL (ref 0.0–0.5)
HCT: 40.2 % (ref 34.8–46.6)
HGB: 13.5 g/dL (ref 11.6–15.9)
LYMPH%: 30.2 % (ref 14.0–49.7)
MCH: 31.1 pg (ref 25.1–34.0)
MCHC: 33.6 g/dL (ref 31.5–36.0)
MCV: 92.6 fL (ref 79.5–101.0)
MONO#: 0.6 10*3/uL (ref 0.1–0.9)
MONO%: 12.4 % (ref 0.0–14.0)
NEUT#: 2.8 10*3/uL (ref 1.5–6.5)
NEUT%: 55.4 % (ref 38.4–76.8)
PLATELETS: 204 10*3/uL (ref 145–400)
RBC: 4.34 10*6/uL (ref 3.70–5.45)
RDW: 14 % (ref 11.2–14.5)
WBC: 5 10*3/uL (ref 3.9–10.3)
lymph#: 1.5 10*3/uL (ref 0.9–3.3)

## 2016-11-08 MED ORDER — SODIUM CHLORIDE 0.9 % IV SOLN
Freq: Once | INTRAVENOUS | Status: AC
  Administered 2016-11-08: 09:00:00 via INTRAVENOUS

## 2016-11-08 MED ORDER — SODIUM CHLORIDE 0.9 % IV SOLN
Freq: Once | INTRAVENOUS | Status: AC
  Administered 2016-11-08: 09:00:00 via INTRAVENOUS
  Filled 2016-11-08: qty 5

## 2016-11-08 MED ORDER — PALONOSETRON HCL INJECTION 0.25 MG/5ML
0.2500 mg | Freq: Once | INTRAVENOUS | Status: AC
  Administered 2016-11-08: 0.25 mg via INTRAVENOUS

## 2016-11-08 MED ORDER — CYCLOPHOSPHAMIDE CHEMO INJECTION 1 GM
600.0000 mg/m2 | Freq: Once | INTRAMUSCULAR | Status: AC
  Administered 2016-11-08: 1160 mg via INTRAVENOUS
  Filled 2016-11-08: qty 58

## 2016-11-08 MED ORDER — PALONOSETRON HCL INJECTION 0.25 MG/5ML
INTRAVENOUS | Status: AC
  Filled 2016-11-08: qty 5

## 2016-11-08 MED ORDER — SODIUM CHLORIDE 0.9% FLUSH
10.0000 mL | INTRAVENOUS | Status: DC | PRN
  Administered 2016-11-08: 10 mL via INTRAVENOUS
  Filled 2016-11-08: qty 10

## 2016-11-08 MED ORDER — HEPARIN SOD (PORK) LOCK FLUSH 100 UNIT/ML IV SOLN
500.0000 [IU] | Freq: Once | INTRAVENOUS | Status: AC | PRN
  Administered 2016-11-08: 500 [IU]
  Filled 2016-11-08: qty 5

## 2016-11-08 MED ORDER — LIDOCAINE-PRILOCAINE 2.5-2.5 % EX CREA: 1.0000 "application " | TOPICAL_CREAM | CUTANEOUS | 1 refills | Status: DC | PRN

## 2016-11-08 MED ORDER — DOXORUBICIN HCL CHEMO IV INJECTION 2 MG/ML
60.0000 mg/m2 | Freq: Once | INTRAVENOUS | Status: AC
  Administered 2016-11-08: 116 mg via INTRAVENOUS
  Filled 2016-11-08: qty 58

## 2016-11-08 MED ORDER — SODIUM CHLORIDE 0.9% FLUSH
10.0000 mL | INTRAVENOUS | Status: DC | PRN
  Administered 2016-11-08: 10 mL
  Filled 2016-11-08: qty 10

## 2016-11-08 NOTE — Patient Instructions (Addendum)
April Orozco Discharge Instructions for Patients Receiving Chemotherapy  Today you received the following chemotherapy agents:  Doxorubin, Cytoxan  To help prevent nausea and vomiting after your treatment, we encourage you to take your nausea medication as prescribed.   If you develop nausea and vomiting that is not controlled by your nausea medication, call the clinic.   BELOW ARE SYMPTOMS THAT SHOULD BE REPORTED IMMEDIATELY:  *FEVER GREATER THAN 100.5 F  *CHILLS WITH OR WITHOUT FEVER  NAUSEA AND VOMITING THAT IS NOT CONTROLLED WITH YOUR NAUSEA MEDICATION  *UNUSUAL SHORTNESS OF BREATH  *UNUSUAL BRUISING OR BLEEDING  TENDERNESS IN MOUTH AND THROAT WITH OR WITHOUT PRESENCE OF ULCERS  *URINARY PROBLEMS  *BOWEL PROBLEMS  UNUSUAL RASH Items with * indicate a potential emergency and should be followed up as soon as possible.  Feel free to call the clinic you have any questions or concerns. The clinic phone number is [phone].  Please show the [place] at check-in to the Emergency Department and triage nurse.   Tbo-Filgrastim injection What is this medicine? TBO-FILGRASTIM (T B O fil GRA stim) is a granulocyte colony-stimulating factor that stimulates the growth of neutrophils, a type of white blood cell important in the body's fight against infection. It is used to reduce the incidence of fever and infection in patients with certain types of cancer who are receiving chemotherapy that affects the bone marrow. COMMON BRAND NAME(S): Granix What should I tell my health care provider before I take this medicine? They need to know if you have any of these conditions: -ongoing radiation therapy -sickle cell anemia -an unusual or allergic reaction to tbo-filgrastim, filgrastim, pegfilgrastim, other medicines, foods, dyes, or preservatives -pregnant or trying to get pregnant -breast-feeding How should I use this medicine? This medicine is for injection  under the skin. If you get this medicine at home, you will be taught how to prepare and give this medicine. Refer to the Instructions for Use that come with your medication packaging. Use exactly as directed. Take your medicine at regular intervals. Do not take your medicine more often than directed. It is important that you put your used needles and syringes in a special sharps container. Do not put them in a trash can. If you do not have a sharps container, call your pharmacist or healthcare provider to get one. Talk to your pediatrician regarding the use of this medicine in children. Special care may be needed. What if I miss a dose? It is important not to miss your dose. Call your doctor or health care professional if you miss a dose. What may interact with this medicine? This medicine may interact with the following medications: -medicines that may cause a release of neutrophils, such as lithium What should I watch for while using this medicine? You may need blood work done while you are taking this medicine. What side effects may I notice from receiving this medicine? Side effects that you should report to your doctor or health care professional as soon as possible: -allergic reactions like skin rash, itching or hives, swelling of the face, lips, or tongue -shortness of breath or breathing problems -fever -pain, redness, or irritation at site where injected -pinpoint red spots on the skin -stomach or side pain, or pain at the shoulder -swelling -tiredness -trouble passing urine Side effects that usually do not require medical attention (report to your doctor or health care professional if they continue or are bothersome): -bone pain -muscle pain Where should I  keep my medicine? Keep out of the reach of children. Store in a refrigerator between 2 and 8 degrees C (36 and 46 degrees F). Keep in carton to protect from light. Throw away this medicine if it is left out of the refrigerator  for more than 5 consecutive days. Throw away any unused medicine after the expiration date.  2017 [employer]vier/Gold Standard (2015-12-14 12:15:25)

## 2016-11-09 ENCOUNTER — Ambulatory Visit (HOSPITAL_BASED_OUTPATIENT_CLINIC_OR_DEPARTMENT_OTHER): Payer: 59

## 2016-11-09 VITALS — BP 122/66 | HR 77 | Temp 99.0°F | Resp 18

## 2016-11-09 DIAGNOSIS — C50511 Malignant neoplasm of lower-outer quadrant of right female breast: Secondary | ICD-10-CM | POA: Diagnosis not present

## 2016-11-09 DIAGNOSIS — D701 Agranulocytosis secondary to cancer chemotherapy: Secondary | ICD-10-CM

## 2016-11-09 DIAGNOSIS — Z170 Estrogen receptor positive status [ER+]: Principal | ICD-10-CM

## 2016-11-09 MED ORDER — TBO-FILGRASTIM 480 MCG/0.8ML ~~LOC~~ SOSY
480.0000 ug | PREFILLED_SYRINGE | Freq: Once | SUBCUTANEOUS | Status: AC
  Administered 2016-11-09: 480 ug via SUBCUTANEOUS

## 2016-11-09 NOTE — Patient Instructions (Signed)
Filgrastim, G-CSF injection What is this medicine? FILGRASTIM, G-CSF (fil GRA stim) is a granulocyte colony-stimulating factor that stimulates the growth of neutrophils, a type of white blood cell (WBC) important in the body's fight against infection. It is used to reduce the incidence of fever and infection in patients with certain types of cancer who are receiving chemotherapy that affects the bone marrow, to stimulate blood cell production for removal of WBCs from the body prior to a bone marrow transplantation, to reduce the incidence of fever and infection in patients who have severe chronic neutropenia, and to improve survival outcomes following high-dose radiation exposure that is toxic to the bone marrow. This medicine may be used for other purposes; ask your health care provider or pharmacist if you have questions. COMMON BRAND NAME(S): Neupogen, Zarxio What should I tell my health care provider before I take this medicine? They need to know if you have any of these conditions: -kidney disease -latex allergy -ongoing radiation therapy -sickle cell disease -an unusual or allergic reaction to filgrastim, pegfilgrastim, other medicines, foods, dyes, or preservatives -pregnant or trying to get pregnant -breast-feeding How should I use this medicine? This medicine is for injection under the skin or infusion into a vein. As an infusion into a vein, it is usually given by a health care professional in a hospital or clinic setting. If you get this medicine at home, you will be taught how to prepare and give this medicine. Refer to the Instructions for Use that come with your medication packaging. Use exactly as directed. Take your medicine at regular intervals. Do not take your medicine more often than directed. It is important that you put your used needles and syringes in a special sharps container. Do not put them in a trash can. If you do not have a sharps container, call your pharmacist or  healthcare provider to get one. Talk to your pediatrician regarding the use of this medicine in children. While this drug may be prescribed for children as young as 7 months for selected conditions, precautions do apply. Overdosage: If you think you have taken too much of this medicine contact a poison control center or emergency room at once. NOTE: This medicine is only for you. Do not share this medicine with others. What if I miss a dose? It is important not to miss your dose. Call your doctor or health care professional if you miss a dose. What may interact with this medicine? This medicine may interact with the following medications: -medicines that may cause a release of neutrophils, such as lithium This list may not describe all possible interactions. Give your health care provider a list of all the medicines, herbs, non-prescription drugs, or dietary supplements you use. Also tell them if you smoke, drink alcohol, or use illegal drugs. Some items may interact with your medicine. What should I watch for while using this medicine? You may need blood work done while you are taking this medicine. What side effects may I notice from receiving this medicine? Side effects that you should report to your doctor or health care professional as soon as possible: -allergic reactions like skin rash, itching or hives, swelling of the face, lips, or tongue -dizziness or feeling faint -fever -pain, redness, or irritation at site where injected -pinpoint red spots on the skin -shortness of breath or breathing problems -signs and symptoms of kidney injury like trouble passing urine, change in the amount of urine, or red or dark-brown urine -stomach or side pain,   or pain at the shoulder -swelling -tiredness - unusual bleeding or bruising Side effects that usually do not require medical attention (report to your doctor or health care professional if they continue or are bothersome): -bone  pain -headache -muscle pain This list may not describe all possible side effects. Call your doctor for medical advice about side effects. You may report side effects to FDA at 1-800-FDA-1088. Where should I keep my medicine? Keep out of the reach of children. Store in a refrigerator between 2 and 8 degrees C (36 and 46 degrees F). Do not freeze. Keep in carton to protect from light. Throw away this medicine if vials or syringes are left out of the refrigerator for more than 24 hours. Throw away any unused medicine after the expiration date. NOTE: This sheet is a summary. It may not cover all possible information. If you have questions about this medicine, talk to your doctor, pharmacist, or health care provider.  2017 Elsevier/Gold Standard (2015-05-31 10:57:13)  

## 2016-11-11 ENCOUNTER — Other Ambulatory Visit: Payer: Self-pay | Admitting: Hematology

## 2016-11-11 ENCOUNTER — Ambulatory Visit (HOSPITAL_BASED_OUTPATIENT_CLINIC_OR_DEPARTMENT_OTHER): Payer: 59

## 2016-11-11 VITALS — BP 129/62 | HR 72 | Temp 98.4°F | Resp 18

## 2016-11-11 DIAGNOSIS — C773 Secondary and unspecified malignant neoplasm of axilla and upper limb lymph nodes: Secondary | ICD-10-CM

## 2016-11-11 DIAGNOSIS — C50511 Malignant neoplasm of lower-outer quadrant of right female breast: Secondary | ICD-10-CM | POA: Diagnosis not present

## 2016-11-11 DIAGNOSIS — Z170 Estrogen receptor positive status [ER+]: Principal | ICD-10-CM

## 2016-11-11 MED ORDER — TBO-FILGRASTIM 480 MCG/0.8ML ~~LOC~~ SOSY
480.0000 ug | PREFILLED_SYRINGE | Freq: Once | SUBCUTANEOUS | Status: AC
Start: 2016-11-11 — End: 2016-11-11
  Administered 2016-11-11: 480 ug via SUBCUTANEOUS
  Filled 2016-11-11: qty 0.8

## 2016-11-12 ENCOUNTER — Other Ambulatory Visit: Payer: Self-pay | Admitting: *Deleted

## 2016-11-12 ENCOUNTER — Telehealth: Payer: Self-pay | Admitting: *Deleted

## 2016-11-12 MED ORDER — VENLAFAXINE HCL ER 37.5 MG PO CP24: 37.5000 mg | ORAL_CAPSULE | Freq: Every day | ORAL | 0 refills | Status: DC

## 2016-11-12 NOTE — Telephone Encounter (Signed)
-----   Message from Truitt Merle, MD sent at 11/11/2016  8:52 AM EST ----- Regarding: RE: 'Nausea/Vomiting" Thanks Lanelle Bal.  Marieanne Marxen, please give her a call for follow up today. Make sure she is using compazine and zofran around the clock. If she still has bad nausea, call in dexa 4mg  daily for 5 days. Please check to see if she needs come in for IVF  Krista Blue  ----- Message ----- From: Azzie Glatter, RN Sent: 11/09/2016  11:50 AM To: Jesse Fall, RN, Truitt Merle, MD Subject: 'Nausea/Vomiting"                              Dr. Burr Medico,  Patient came in for Granix today (11/09/16-Saturday) and is complaining of vomiting immediately after her chemotherapy treatment yesterday (11/08/16-Friday). Patient is currently on Day 1, Cycle 3 of Adriamycin and Cytoxan.  Patient states that she vomited all day yesterday, but is beginning to feel better today. Provided education on oral antiemetics post infusion treatments, but since patient complains of vomiting immediately after every treatment so far, wanted to inquire with you about possibly adding additional antiemetic to pre chemo infusion?  Patient states that she usually vomits all the way home after her chemo infusions, so she doesn't have time to take her oral antiemetic meds. Thanks Dr. Burr Medico.  Natalie.

## 2016-11-12 NOTE — Telephone Encounter (Signed)
Called pt regarding nausea & she reports that she is taking nausea meds around the clock but her concern is that she is having cold sweats & wakes up every 90 minutes with bed soaked & freezing.  She reports not being able to sleep & took sleeping pill & pain pill, & 2 xanax & still woke up every 90 minutes.  She wants to know if there is something that she can take for this.  She mentioned effexor but states that she does't feel like this is hot flashes but has never had them before so doesn't know what is going on.  She feels like she is going to have a nervous breakdown. Discussed with Dr Burr Medico & will send in script for effexor XR & pt is willing to try tonight & is also willing to come in tomorrow.  Informed to come in @ 8:30 am for appt @ 8:45 am.  Message to scheduler.

## 2016-11-13 ENCOUNTER — Ambulatory Visit (HOSPITAL_BASED_OUTPATIENT_CLINIC_OR_DEPARTMENT_OTHER): Payer: 59 | Admitting: Hematology

## 2016-11-13 ENCOUNTER — Encounter: Payer: Self-pay | Admitting: Hematology

## 2016-11-13 ENCOUNTER — Ambulatory Visit (HOSPITAL_BASED_OUTPATIENT_CLINIC_OR_DEPARTMENT_OTHER): Payer: 59

## 2016-11-13 ENCOUNTER — Other Ambulatory Visit: Payer: Self-pay | Admitting: Hematology

## 2016-11-13 VITALS — BP 127/61 | HR 72 | Temp 98.5°F | Resp 20 | Ht 64.0 in | Wt 176.1 lb

## 2016-11-13 DIAGNOSIS — R112 Nausea with vomiting, unspecified: Secondary | ICD-10-CM

## 2016-11-13 DIAGNOSIS — C50511 Malignant neoplasm of lower-outer quadrant of right female breast: Secondary | ICD-10-CM | POA: Diagnosis not present

## 2016-11-13 DIAGNOSIS — C773 Secondary and unspecified malignant neoplasm of axilla and upper limb lymph nodes: Secondary | ICD-10-CM | POA: Diagnosis not present

## 2016-11-13 DIAGNOSIS — R61 Generalized hyperhidrosis: Secondary | ICD-10-CM

## 2016-11-13 DIAGNOSIS — G4700 Insomnia, unspecified: Secondary | ICD-10-CM | POA: Diagnosis not present

## 2016-11-13 DIAGNOSIS — Z452 Encounter for adjustment and management of vascular access device: Secondary | ICD-10-CM | POA: Diagnosis not present

## 2016-11-13 DIAGNOSIS — Z170 Estrogen receptor positive status [ER+]: Principal | ICD-10-CM

## 2016-11-13 DIAGNOSIS — Z95828 Presence of other vascular implants and grafts: Secondary | ICD-10-CM

## 2016-11-13 DIAGNOSIS — F419 Anxiety disorder, unspecified: Secondary | ICD-10-CM

## 2016-11-13 LAB — COMPREHENSIVE METABOLIC PANEL
ALT: 18 U/L (ref 0–55)
ANION GAP: 10 meq/L (ref 3–11)
AST: 13 U/L (ref 5–34)
Albumin: 4 g/dL (ref 3.5–5.0)
Alkaline Phosphatase: 87 U/L (ref 40–150)
BILIRUBIN TOTAL: 0.29 mg/dL (ref 0.20–1.20)
BUN: 15.1 mg/dL (ref 7.0–26.0)
CO2: 23 meq/L (ref 22–29)
Calcium: 9.6 mg/dL (ref 8.4–10.4)
Chloride: 103 mEq/L (ref 98–109)
Creatinine: 0.7 mg/dL (ref 0.6–1.1)
GLUCOSE: 120 mg/dL (ref 70–140)
POTASSIUM: 4.1 meq/L (ref 3.5–5.1)
SODIUM: 136 meq/L (ref 136–145)
TOTAL PROTEIN: 7.7 g/dL (ref 6.4–8.3)

## 2016-11-13 LAB — CBC WITH DIFFERENTIAL/PLATELET
BASO%: 0.3 % (ref 0.0–2.0)
BASOS ABS: 0 10*3/uL (ref 0.0–0.1)
EOS ABS: 0 10*3/uL (ref 0.0–0.5)
EOS%: 0.3 % (ref 0.0–7.0)
HCT: 41 % (ref 34.8–46.6)
HGB: 13.6 g/dL (ref 11.6–15.9)
LYMPH%: 14.1 % (ref 14.0–49.7)
MCH: 30.5 pg (ref 25.1–34.0)
MCHC: 33.2 g/dL (ref 31.5–36.0)
MCV: 92.1 fL (ref 79.5–101.0)
MONO#: 0.1 10*3/uL (ref 0.1–0.9)
MONO%: 0.9 % (ref 0.0–14.0)
NEUT#: 6.3 10*3/uL (ref 1.5–6.5)
NEUT%: 84.4 % — AB (ref 38.4–76.8)
PLATELETS: 173 10*3/uL (ref 145–400)
RBC: 4.45 10*6/uL (ref 3.70–5.45)
RDW: 14.2 % (ref 11.2–14.5)
WBC: 7.4 10*3/uL (ref 3.9–10.3)
lymph#: 1 10*3/uL (ref 0.9–3.3)

## 2016-11-13 MED ORDER — SODIUM CHLORIDE 0.9 % IV SOLN
Freq: Once | INTRAVENOUS | Status: AC
  Administered 2016-11-13: 12:00:00 via INTRAVENOUS

## 2016-11-13 MED ORDER — HEPARIN SOD (PORK) LOCK FLUSH 100 UNIT/ML IV SOLN
500.0000 [IU] | Freq: Once | INTRAVENOUS | Status: AC | PRN
  Administered 2016-11-13: 500 [IU] via INTRAVENOUS
  Filled 2016-11-13: qty 5

## 2016-11-13 MED ORDER — SODIUM CHLORIDE 0.9% FLUSH
10.0000 mL | INTRAVENOUS | Status: DC | PRN
  Administered 2016-11-13: 10 mL via INTRAVENOUS
  Filled 2016-11-13: qty 10

## 2016-11-13 MED ORDER — ALTEPLASE 2 MG IJ SOLR
2.0000 mg | Freq: Once | INTRAMUSCULAR | Status: AC | PRN
  Administered 2016-11-13: 2 mg
  Filled 2016-11-13: qty 2

## 2016-11-13 NOTE — Patient Instructions (Signed)
Dehydration, Adult Dehydration is a condition in which there is not enough fluid or water in the body. This happens when you lose more fluids than you take in. Important organs, such as the kidneys, brain, and heart, cannot function without a proper amount of fluids. Any loss of fluids from the body can lead to dehydration. Dehydration can range from mild to severe. This condition should be treated right away to prevent it from becoming severe. What are the causes? This condition may be caused by:  Vomiting.  Diarrhea.  Excessive sweating, such as from heat exposure or exercise.  Not drinking enough fluid, especially:  When ill.  While doing activity that requires a lot of energy.  Excessive urination.  Fever.  Infection.  Certain medicines, such as medicines that cause the body to lose excess fluid (diuretics).  Inability to access safe drinking water.  Reduced physical ability to get adequate water and food. What increases the risk? This condition is more likely to develop in people:  Who have a poorly controlled long-term (chronic) illness, such as diabetes, heart disease, or kidney disease.  Who are age 65 or older.  Who are disabled.  Who live in a place with high altitude.  Who play endurance sports. What are the signs or symptoms? Symptoms of mild dehydration may include:   Thirst.  Dry lips.  Slightly dry mouth.  Dry, warm skin.  Dizziness. Symptoms of moderate dehydration may include:   Very dry mouth.  Muscle cramps.  Dark urine. Urine may be the color of tea.  Decreased urine production.  Decreased tear production.  Heartbeat that is irregular or faster than normal (palpitations).  Headache.  Light-headedness, especially when you stand up from a sitting position.  Fainting (syncope). Symptoms of severe dehydration may include:   Changes in skin, such as:  Cold and clammy skin.  Blotchy (mottled) or pale skin.  Skin that does  not quickly return to normal after being lightly pinched and released (poor skin turgor).  Changes in body fluids, such as:  Extreme thirst.  No tear production.  Inability to sweat when body temperature is high, such as in hot weather.  Very little urine production.  Changes in vital signs, such as:  Weak pulse.  Pulse that is more than 100 beats a minute when sitting still.  Rapid breathing.  Low blood pressure.  Other changes, such as:  Sunken eyes.  Cold hands and feet.  Confusion.  Lack of energy (lethargy).  Difficulty waking up from sleep.  Short-term weight loss.  Unconsciousness. How is this diagnosed? This condition is diagnosed based on your symptoms and a physical exam. Blood and urine tests may be done to help confirm the diagnosis. How is this treated? Treatment for this condition depends on the severity. Mild or moderate dehydration can often be treated at home. Treatment should be started right away. Do not wait until dehydration becomes severe. Severe dehydration is an emergency and it needs to be treated in a hospital. Treatment for mild dehydration may include:   Drinking more fluids.  Replacing salts and minerals in your blood (electrolytes) that you may have lost. Treatment for moderate dehydration may include:   Drinking an oral rehydration solution (ORS). This is a drink that helps you replace fluids and electrolytes (rehydrate). It can be found at pharmacies and retail stores. Treatment for severe dehydration may include:   Receiving fluids through an IV tube.  Receiving an electrolyte solution through a feeding tube that is   passed through your nose and into your stomach (nasogastric tube, or NG tube).  Correcting any abnormalities in electrolytes.  Treating the underlying cause of dehydration. Follow these instructions at home:  If directed by your health care provider, drink an ORS:  Make an ORS by following instructions on the  package.  Start by drinking small amounts, about  cup (120 mL) every 5-10 minutes.  Slowly increase how much you drink until you have taken the amount recommended by your health care provider.  Drink enough clear fluid to keep your urine clear or pale yellow. If you were told to drink an ORS, finish the ORS first, then start slowly drinking other clear fluids. Drink fluids such as:  Water. Do not drink only water. Doing that can lead to having too little salt (sodium) in the body (hyponatremia).  Ice chips.  Fruit juice that you have added water to (diluted fruit juice).  Low-calorie sports drinks.  Avoid:  Alcohol.  Drinks that contain a lot of sugar. These include high-calorie sports drinks, fruit juice that is not diluted, and soda.  Caffeine.  Foods that are greasy or contain a lot of fat or sugar.  Take over-the-counter and prescription medicines only as told by your health care provider.  Do not take sodium tablets. This can lead to having too much sodium in the body (hypernatremia).  Eat foods that contain a healthy balance of electrolytes, such as bananas, oranges, potatoes, tomatoes, and spinach.  Keep all follow-up visits as told by your health care provider. This is important. Contact a health care provider if:  You have abdominal pain that:  Gets worse.  Stays in one area (localizes).  You have a rash.  You have a stiff neck.  You are more irritable than usual.  You are sleepier or more difficult to wake up than usual.  You feel weak or dizzy.  You feel very thirsty.  You have urinated only a small amount of very dark urine over 6-8 hours. Get help right away if:  You have symptoms of severe dehydration.  You cannot drink fluids without vomiting.  Your symptoms get worse with treatment.  You have a fever.  You have a severe headache.  You have vomiting or diarrhea that:  Gets worse.  Does not go away.  You have blood or green matter  (bile) in your vomit.  You have blood in your stool. This may cause stool to look black and tarry.  You have not urinated in 6-8 hours.  You faint.  Your heart rate while sitting still is over 100 beats a minute.  You have trouble breathing. This information is not intended to replace advice given to you by your health care provider. Make sure you discuss any questions you have with your health care provider. Document Released: 11/11/2005 Document Revised: 06/07/2016 Document Reviewed: 01/05/2016 Elsevier Interactive Patient Education  2017 [employer]  

## 2016-11-13 NOTE — Progress Notes (Signed)
[place]  [phone] Fax:([phone]) [phone]  Clinic Follow up Note   Patient Care Team: No Pcp Per Patient as PCP - General (General Practice) Rolm Bookbinder, MD as Consulting Physician (General Surgery) Truitt Merle, MD as Consulting Physician (Hematology) Kyung Rudd, MD as Consulting Physician (Radiation Oncology) 11/13/2016  CHIEF COMPLAINTS:  Follow up right breast cancer  Oncology History   Malignant neoplasm of lower-outer quadrant of right female breast [facility])   Staging form: Breast, AJCC 7th Edition   - Clinical stage from 09/12/2016: Stage IIB (T2, N1, M0) - Signed by Truitt Merle, MD on 09/18/2016      Malignant neoplasm of lower-outer quadrant of right female breast ([place])   09/10/2016 Mammogram    Mammogram and ultrasound showed a 2.7 cm mass at 8 clock position of the right breast, there is also a lobulated lymph nodes in the right axilla, slightly suspicious for malignancy.       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 female is here because of her recently diagnosed right breast cancer. She is accompanied by her boyfriend to our multidisciplinary breast clinic today.   She noticed a right breast lump 3 monthsa ago, no pain or tenderness, no skin change or nipple discharge. She feels the lump has been getting bigger  over the past 3 months. She called her gynecologist and was finally seen a few weeks ago. She was referred to have a diagnostic mammogram and ultrasound which showed a 2.7 cm mass at the 8:00 position of the right breast, and also a lobulated lymph nodes in the right axilla. 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  She feels well, denies any skin pain, or other symptoms. No recent weight loss. 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 Her husband died from small cell cancer several years ago, she has 5 daughters. She recently found out that 3 of her paternal cousins had breast cancer.  GYN HISTORY  Menarchal: 17 LMP: regular  Contraceptive: no  HRT: n/a  G5P5: 5 daughters 30-19  CURRENT THERAPY: Neoadjuvant Adriamycin and Cytoxan, every 2 weeks, with Neulasta, for total 4 cycles, followed by weekly Taxol for 12 weeks  INTERIM HISTORY: [place] came in today for a sick visit. She has been having intermittent nausea and several episodes of vomiting since last cycle of chemotherapy. She has been taking Compazine and Zofran, does not like Ativan. Her main concern is her night sweating, she wakes up several times at night with soaking wet cloth and lining, no feeling of warmth and flushing, she actually feels cold most of time. Her appetite has been low,  she eats small meals, probably not drinks adequately, she has been feeling quite fatigued, did not go to work this week. She has moderate numbness in her feet, no tingling or pain. She had lost about 6 pounds in the past week. She drinks Ensure sometime, not often.   MEDICAL HISTORY:  Past Medical History:  Diagnosis Date  . Fracture, sacrum/coccyx ([place]) 1970   "Shattered tailbone"  . Heart murmur    [facility] has evaluated, felt benign  . Malignant  neoplasm of lower-outer quadrant of right female breast ([place]) 09/13/2016  . Tachycardia    Intermittent, resolved with decreased caffeine intake    SURGICAL HISTORY: Past Surgical History:  Procedure Laterality Date  . APPENDECTOMY    . PORTACATH PLACEMENT Right 09/23/2016   Procedure: INSERTION PORT-A-CATH WITH [place];  Surgeon: Rolm Bookbinder, MD;  Location: [place];  Service: General;  Laterality: Right;  . TONSILECTOMY/ADENOIDECTOMY WITH MYRINGOTOMY    . TUBAL LIGATION      SOCIAL HISTORY: Social History   Social History  . Marital status: Widowed    Spouse name: N/A  . Number of children: 5  . Years of education: N/A   Occupational History  . 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 Smoking status: Never Smoker  . Smokeless tobacco: Never Used  . Alcohol use Yes     Comment: social drinker   . Drug use: No  . Sexual activity: Not on file   Other Topics Concern  . Not on file   Social History Narrative  . No narrative on file    FAMILY HISTORY: Family History  Problem Relation Age of Onset  . Diabetes Mother   . Diabetes Father   . Skin cancer Father 10    NOS type; worked as a Theme park manager for many years  . Hypertension Other   . Breast cancer Cousin 15    paternal 1st cousin; w/ mets to bone  . Breast cancer Cousin     paternal 1st cousin dx 29-50; s/p lump  . Breast cancer Cousin     paternal 1st cousin dx 63-53 w/ mets to LN; s/p BL mastectomies  . Heart disease Maternal Aunt 58  . Colon cancer Paternal Uncle     dx 67-68  . Goiter Maternal Grandmother     d. bled to death following goiter surgery  . Heart attack Maternal Grandfather     d. early 82s  . Lung cancer Paternal Grandmother     dx 87s; d. 88y  . Diabetes Paternal Grandmother   . Heart failure Paternal Grandfather     d. 75-76  . Alzheimer's disease Paternal Grandfather   . Other Daughter 25    daughter w/ hx benign breast lump/cyst removed    . Epilepsy Maternal Aunt   . Alzheimer's disease Paternal Aunt   . Brain cancer Cousin 58    paternal 1st cousin; d. 53y; NOS type    ALLERGIES:  is allergic to banana; sulfur; and tramadol.  MEDICATIONS:  Current Outpatient Prescriptions  Medication Sig Dispense Refill  . ALPRAZolam (XANAX) 0.5 MG tablet Take 1 tablet (0.5 mg total) by mouth at bedtime as needed for anxiety. 20 tablet 0  . docusate sodium (COLACE) 50 MG capsule Take 50 mg by mouth 2 (two) times daily.    Marland Kitchen ibuprofen (ADVIL,MOTRIN) 200 MG tablet Take 800 mg by mouth every 6 (six) hours as needed for headache, mild  pain or moderate pain.    Marland Kitchen lidocaine-prilocaine (EMLA) cream Apply 1 application topically as needed. 30 g 1  . LORazepam (ATIVAN) 0.5 MG tablet Take 0.5 mg by mouth every 6 (six) hours as needed for anxiety (and-or nausea/vomiting).    . magic mouthwash SOLN Take 5 mLs by mouth 4 (four) times daily as needed for mouth pain. 240 mL 0  . ondansetron (ZOFRAN) 8 MG tablet Take 1 tablet (8 mg total) by mouth 2 (two) times daily as needed for nausea or vomiting. 20 tablet 1  . oxyCODONE-acetaminophen (PERCOCET/ROXICET) 5-325 MG tablet Take 1-2 tablets by mouth every 8 (eight) hours as needed for severe pain. 30 tablet 0  . prochlorperazine (COMPAZINE) 10 MG tablet Take 1 tablet (10 mg total) by mouth every 6 (six) hours as needed for nausea or vomiting. 30 tablet 1  . venlafaxine XR (EFFEXOR-XR) 37.5 MG 24 hr capsule Take 1 capsule (37.5 mg total) by mouth daily with breakfast. 30 capsule 0  . zolpidem (AMBIEN) 5 MG tablet Take 1 tablet (5 mg total) by mouth at bedtime as needed for sleep. 15 tablet 0   No current facility-administered medications for this visit.     REVIEW OF SYSTEMS:   Constitutional: Denies fevers, chills (+) cold sweats Eyes: Denies blurriness of vision, double vision or watery eyes Ears, nose, mouth, throat, and face: Denies mucositis or sore throat (+) congestion and drainage Respiratory:  Denies dyspnea or wheezes (+) slight cough Cardiovascular: Denies palpitation, chest discomfort or lower extremity swelling Gastrointestinal:  Denies nausea, heartburn or change in bowel habits Skin: Denies abnormal skin rashes Lymphatics: Denies new lymphadenopathy or easy bruising Neurological:Denies new weaknesses (+) tingling/numbness on edges of fingertips and toes Behavioral/Psych: Mood is stable, no new changes  All other systems were reviewed with the patient and are negative.  PHYSICAL EXAMINATION: ECOG PERFORMANCE STATUS: 1  Vitals:   11/13/16 0917  BP: 127/61  Pulse: 72  Resp: 20  Temp: 98.5 F (36.9 C)   Filed Weights   11/13/16 0917  Weight: 176 lb 1.6 oz (79.9 kg)    GENERAL:alert, no distress and comfortable SKIN: skin color, texture, turgor are normal, no rashes or significant lesions EYES: normal, conjunctiva are pink and non-injected, sclera clear OROPHARYNX:no exudate, no erythema and lips, buccal mucosa, and tongue normal  NECK: supple, thyroid normal size, non-tender, without nodularity LYMPH:  no palpable lymphadenopathy in the cervical, axillary or inguinal LUNGS: clear to auscultation and percussion with normal breathing effort HEART: regular rate & rhythm and no murmurs and no lower extremity edema ABDOMEN:abdomen soft, non-tender and normal bowel sounds Musculoskeletal:no cyanosis of digits and no clubbing  PSYCH: alert & oriented x 3 with fluent speech NEURO: no focal motor/sensory deficits   LABORATORY DATA:  I have reviewed the data as listed CBC Latest Ref Rng & Units 11/13/2016 11/08/2016 11/01/2016  WBC 3.9 - 10.3 10e3/uL 7.4 5.0 2.3(L)  Hemoglobin 11.6 - 15.9 g/dL 13.6 13.5 12.8  Hematocrit 34.8 - 46.6 % 41.0 40.2 37.7  Platelets 145 - 400 10e3/uL 173 204 158   CMP Latest Ref Rng & Units 11/13/2016 11/08/2016 11/01/2016  Glucose 70 - 140 mg/dl 120 164(H) 125  BUN 7.0 - 26.0 mg/dL 15.1 11.9 11.0  Creatinine 0.6 - 1.1 mg/dL 0.7 0.7 0.7    Sodium 136 - 145 mEq/L 136 140 141  Potassium 3.5 - 5.1 mEq/L 4.1 3.6 3.5  Chloride 101 - 111 mmol/L - - -  CO2 22 - 29 mEq/L  23 24 25   Calcium 8.4 - 10.4 mg/dL 9.6 9.3 9.3  Total Protein 6.4 - 8.3 g/dL 7.7 7.7 7.5  Total Bilirubin 0.20 - 1.20 mg/dL 0.29 0.22 <0.22  Alkaline Phos 40 - 150 U/L 87 83 77  AST 5 - 34 U/L 13 16 18   ALT 0 - 55 U/L 18 20 21      PATHOLOGY REPORTS: Diagnosis 09/12/2016 1. Lymph node, needle/core biopsy, right axilla - METASTATIC CARCINOMA WITH EXTRACAPSULAR EXTENSION. 2. Breast, right, needle core biopsy, 8 o'clock - INVASIVE DUCTAL CARCINOMA, GRADE 3.  Microscopic Comment 2. The findings are consistent with grade 3 ductal carcinoma. Breast prognostic profile will be performed.  1. PROGNOSTIC INDICATORS Results: IMMUNOHISTOCHEMICAL AND MORPHOMETRIC ANALYSIS PERFORMED MANUALLY Estrogen Receptor: 100%, POSITIVE, STRONG STAINING INTENSITY Progesterone Receptor: 100%, POSITIVE, STRONG STAINING INTENSITY Proliferation Marker Ki67: 15%  Results: HER2 - NEGATIVE RATIO OF HER2/CEP17 SIGNALS 1.52 AVERAGE HER2 COPY NUMBER PER CELL 2.35  2. PROGNOSTIC INDICATORS Results: IMMUNOHISTOCHEMICAL AND MORPHOMETRIC ANALYSIS PERFORMED MANUALLY Estrogen Receptor: 90%, POSITIVE, STRONG STAINING INTENSITY Progesterone Receptor: 90%,  Proliferation Marker Ki67: 70%  Results: HER2 - NEGATIVE RATIO OF HER2/CEP17 SIGNALS 1.09 AVERAGE HER2 COPY NUMBER PER CELL 1.90  RADIOGRAPHIC STUDIES: I have personally reviewed the radiological images as listed and agreed with the findings in the report. No results found.  ASSESSMENT & PLAN:  50 y.o. premenopausal Caucasian female, presented with a palpable right breast mass.  1. 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 -Due to the clinically rapid growing tumor, moderate size, positive lymph nodes, high-grade, high Ki-67, this is likely a aggressive tumor, the risk of recurrence after surgery is likely high. -I recommend her to consider neoadjuvant chemotherapy, to shrink the tumor, make the lumpectomy more feasible, and potentially avoid axillary lymph node dissection. -Giving the ER/PR strongly positive, HER-2 negative disease, I recommend her to have dose dense Adriamycin and Cytoxan, every 2 weeks for 4 cycles, followed by weekly Taxol for 12 weeks, as neoadjuvant therapy. -She tolerated the second cycle AC better without Neulasta but had neutropenia and cold and cycle 3 was postponed for one week -She has worsening fatigue from chemo, not able to work this week -I reviewed the management of nausea, vomiting, and nutrition supplement -I'll give her IV fluids today, and the schedule IV fluids after next cycle chemotherapy -Consider switching to neupogen from neulasta because of bone pain for next cycle chemo   2. Genetics  -Her young age and strong family history of breast cancer, we'll refer her to his genetics for testing, to rule out inheritable genetic syndrome.  3. Anxiety  and insomnia -She has been quite anxious since cancer diagnosis, but does not feel she needs medication -She has tried Ambien, does not like it  -she takes lorazepam 0.5 mg as needed for insomnia and anxiety    4. Night sweats  -Probably related to chemotherapy and chemotherapy-induced over suppression/menopause -She also has significant anxiety, I recommend her to try Effexor, benefit and potential side effects, we'll discussed with patient, she agrees to proceed. I'll start her on low-dose 37.77m daily, and titrate if needed -I encouraged her to drink water   5. Nausea and vomiting -I encouraged her to  use  Compazine and Zofran, at least 3 times a day before meals, and as needed -she does not want to try dexa, due to her insomnia, she also has ativan as needed but does not take much   Plan  -Lab reviewed, will give IVF 1L today -I called in Effexor 37.20m daily  -she will return ON 12/29 for visit with APP and chemo (last cycle AC) , she will take Granix after next cycle AC on 12/29 and 1/2  -I will schedule her for IVF on 1/3 or 1/2  -I will see her on 1/12 before first dose weekly Taxol    No orders of the defined types were placed in this encounter.   All questions were answered. The patient knows to call the clinic with any problems, questions or concerns.  I spent 20 minutes counseling the patient face to face. The total time spent in the appointment was 25 minutes and more than 50% was on counseling.     FTruitt Merle MD 11/13/2016

## 2016-11-15 ENCOUNTER — Other Ambulatory Visit: Payer: Self-pay | Admitting: *Deleted

## 2016-11-15 DIAGNOSIS — C50511 Malignant neoplasm of lower-outer quadrant of right female breast: Secondary | ICD-10-CM

## 2016-11-15 DIAGNOSIS — Z170 Estrogen receptor positive status [ER+]: Principal | ICD-10-CM

## 2016-11-15 MED ORDER — OXYCODONE-ACETAMINOPHEN 5-325 MG PO TABS: 1.0000 | ORAL_TABLET | Freq: Three times a day (TID) | ORAL | 0 refills | Status: DC | PRN

## 2016-11-15 MED ORDER — ALPRAZOLAM 0.5 MG PO TABS: 0.5000 mg | ORAL_TABLET | Freq: Every evening | ORAL | 0 refills | Status: DC | PRN

## 2016-11-21 ENCOUNTER — Other Ambulatory Visit: Payer: Self-pay | Admitting: Hematology

## 2016-11-22 ENCOUNTER — Encounter: Payer: 59 | Admitting: Nutrition

## 2016-11-22 ENCOUNTER — Ambulatory Visit: Payer: 59

## 2016-11-22 ENCOUNTER — Telehealth: Payer: Self-pay | Admitting: Hematology

## 2016-11-22 ENCOUNTER — Ambulatory Visit (HOSPITAL_BASED_OUTPATIENT_CLINIC_OR_DEPARTMENT_OTHER): Payer: 59 | Admitting: Oncology

## 2016-11-22 ENCOUNTER — Encounter: Payer: Self-pay | Admitting: Nutrition

## 2016-11-22 ENCOUNTER — Encounter: Payer: Self-pay | Admitting: Genetic Counselor

## 2016-11-22 ENCOUNTER — Encounter: Payer: Self-pay | Admitting: Oncology

## 2016-11-22 ENCOUNTER — Other Ambulatory Visit (HOSPITAL_BASED_OUTPATIENT_CLINIC_OR_DEPARTMENT_OTHER): Payer: 59

## 2016-11-22 ENCOUNTER — Telehealth: Payer: Self-pay | Admitting: Genetic Counselor

## 2016-11-22 VITALS — BP 125/52 | HR 75 | Temp 98.7°F | Resp 16 | Ht 64.0 in | Wt 177.3 lb

## 2016-11-22 DIAGNOSIS — C773 Secondary and unspecified malignant neoplasm of axilla and upper limb lymph nodes: Secondary | ICD-10-CM

## 2016-11-22 DIAGNOSIS — Z95828 Presence of other vascular implants and grafts: Secondary | ICD-10-CM

## 2016-11-22 DIAGNOSIS — C50511 Malignant neoplasm of lower-outer quadrant of right female breast: Secondary | ICD-10-CM

## 2016-11-22 DIAGNOSIS — K130 Diseases of lips: Secondary | ICD-10-CM

## 2016-11-22 DIAGNOSIS — D709 Neutropenia, unspecified: Secondary | ICD-10-CM

## 2016-11-22 DIAGNOSIS — Z170 Estrogen receptor positive status [ER+]: Principal | ICD-10-CM

## 2016-11-22 DIAGNOSIS — Z1379 Encounter for other screening for genetic and chromosomal anomalies: Secondary | ICD-10-CM | POA: Insufficient documentation

## 2016-11-22 LAB — CBC WITH DIFFERENTIAL/PLATELET
BASO%: 0 % (ref 0.0–2.0)
BASOS ABS: 0 10*3/uL (ref 0.0–0.1)
EOS ABS: 0 10*3/uL (ref 0.0–0.5)
EOS%: 2.5 % (ref 0.0–7.0)
HEMATOCRIT: 34.9 % (ref 34.8–46.6)
HGB: 12.1 g/dL (ref 11.6–15.9)
LYMPH%: 56.5 % — ABNORMAL HIGH (ref 14.0–49.7)
MCH: 31.3 pg (ref 25.1–34.0)
MCHC: 34.7 g/dL (ref 31.5–36.0)
MCV: 90.2 fL (ref 79.5–101.0)
MONO#: 0.4 10*3/uL (ref 0.1–0.9)
MONO%: 27.3 % — AB (ref 0.0–14.0)
NEUT#: 0.2 10*3/uL — CL (ref 1.5–6.5)
NEUT%: 13.7 % — ABNORMAL LOW (ref 38.4–76.8)
NRBC: 0 % (ref 0–0)
PLATELETS: 191 10*3/uL (ref 145–400)
RBC: 3.87 10*6/uL (ref 3.70–5.45)
RDW: 13.8 % (ref 11.2–14.5)
WBC: 1.6 10*3/uL — ABNORMAL LOW (ref 3.9–10.3)
lymph#: 0.9 10*3/uL (ref 0.9–3.3)

## 2016-11-22 LAB — COMPREHENSIVE METABOLIC PANEL
ALBUMIN: 3.7 g/dL (ref 3.5–5.0)
ALK PHOS: 78 U/L (ref 40–150)
ALT: 20 U/L (ref 0–55)
ANION GAP: 9 meq/L (ref 3–11)
AST: 15 U/L (ref 5–34)
BUN: 7.6 mg/dL (ref 7.0–26.0)
CALCIUM: 9.3 mg/dL (ref 8.4–10.4)
CO2: 25 meq/L (ref 22–29)
CREATININE: 0.7 mg/dL (ref 0.6–1.1)
Chloride: 104 mEq/L (ref 98–109)
Glucose: 89 mg/dl (ref 70–140)
Potassium: 4.4 mEq/L (ref 3.5–5.1)
Sodium: 137 mEq/L (ref 136–145)
TOTAL PROTEIN: 7.3 g/dL (ref 6.4–8.3)

## 2016-11-22 MED ORDER — SODIUM CHLORIDE 0.9% FLUSH
10.0000 mL | INTRAVENOUS | Status: DC | PRN
  Administered 2016-11-22: 10 mL via INTRAVENOUS
  Filled 2016-11-22: qty 10

## 2016-11-22 MED ORDER — CIPROFLOXACIN HCL 500 MG PO TABS: 500.0000 mg | ORAL_TABLET | Freq: Two times a day (BID) | ORAL | 0 refills | Status: DC

## 2016-11-22 MED ORDER — HEPARIN SOD (PORK) LOCK FLUSH 100 UNIT/ML IV SOLN
500.0000 [IU] | Freq: Once | INTRAVENOUS | Status: AC
  Administered 2016-11-22: 500 [IU] via INTRAVENOUS
  Filled 2016-11-22: qty 5

## 2016-11-22 MED ORDER — NYSTATIN 100000 UNIT/GM EX CREA: 1.0000 "application " | TOPICAL_CREAM | Freq: Two times a day (BID) | CUTANEOUS | 0 refills | Status: DC

## 2016-11-22 NOTE — Telephone Encounter (Signed)
LM on VM with good news.  Asked that she call back. 

## 2016-11-22 NOTE — Progress Notes (Signed)
[name]  [phone] Fax:([phone]) [phone]  Clinic Follow up Note   Patient Care Team: No Pcp Per Patient as PCP - General (General Practice) Rolm Bookbinder, MD as Consulting Physician (General Surgery) Truitt Merle, MD as Consulting Physician (Hematology) Kyung Rudd, MD as Consulting Physician (Radiation Oncology) 11/22/2016  CHIEF COMPLAINTS:  Follow up right breast cancer  Oncology History   Malignant neoplasm of lower-outer quadrant of right female breast [facility])   Staging form: Breast, AJCC 7th Edition   - Clinical stage from 09/12/2016: Stage IIB (T2, N1, M0) - Signed by Truitt Merle, MD on 09/18/2016      Malignant neoplasm of lower-outer quadrant of right female breast ([place])   09/10/2016 Mammogram    Mammogram and ultrasound showed a 2.7 cm mass at 8 clock position of the right breast, there is also a lobulated lymph nodes in the right axilla, slightly suspicious for malignancy.       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  Negative genetic testing for the MSH2 inversion analysis ([place] inversion). The Breast/Ovarian gene panel offered by [facility] includes sequencing and rearrangement analysis for the following 20 genes:  ATM, BARD1, BRCA1, BRCA2, BRIP1, CDH1, CHEK2, EPCAM, FANCC, MLH1, MSH2, MSH6, NBN,  PALB2, PMS2, PTEN, RAD51C, RAD51D, TP53, and XRCC2.   The report date is December 19 for the [facility] panel and November 20, 2016 for the [place] inversion.        HISTORY OF PRESENTING ILLNESS (09/18/2016):  April Orozco 50 y.o. female is here because of her recently diagnosed right breast cancer. She is accompanied by her boyfriend to our multidisciplinary breast clinic today.   She noticed a right breast lump 3 monthsa ago, no pain or tenderness, no skin change or nipple discharge. She feels the lump has been getting bigger over the past 3 months. She called her gynecologist and was finally seen a few weeks ago. She was referred to have a diagnostic mammogram and ultrasound which showed a 2.7 cm mass at the 8:00 position of the right breast, and also a lobulated lymph nodes in the right axilla. 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  She feels well, denies any skin pain, or other symptoms. No recent weight loss. 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 Her husband died from small cell cancer several years ago, she has 5 daughters. She recently found out that 3 of her paternal cousins had breast cancer.  GYN HISTORY  Menarchal: 17 LMP: regular  Contraceptive: no  HRT: n/a  G5P5: 5 daughters 30-19  CURRENT THERAPY: Neoadjuvant Adriamycin and Cytoxan, every 2 weeks,  with Neulasta, for total 4 cycles, followed by weekly Taxol for 12 weeks  INTERIM HISTORY: April Orozco today for follow-up prior to cycle 4 for chemotherapy. She continues to be very tired. She has intermittent nausea without vomiting. She has been taking antiemetics as needed. Hot flashes are better with Effexor. Her appetite is fair and her weight is stable. She has moderate numbness in her feet, no tingling or pain. Denies fevers and  chills. Denies chest pain, shortness of breath, abdominal pain. Denies bleeding.  MEDICAL HISTORY:  Past Medical History:  Diagnosis Date  . Fracture, sacrum/coccyx ([place]) 1970   "Shattered tailbone"  . Heart murmur    [facility] has evaluated, felt benign  . Malignant neoplasm of lower-outer quadrant of right female breast ([place]) 09/13/2016  . Tachycardia    Intermittent, resolved with decreased caffeine intake    SURGICAL HISTORY: Past Surgical History:  Procedure Laterality Date  . APPENDECTOMY    . PORTACATH PLACEMENT Right 09/23/2016   Procedure: INSERTION PORT-A-CATH WITH [place];  Surgeon: Rolm Bookbinder, MD;  Location: [place];  Service: General;  Laterality: Right;  . TONSILECTOMY/ADENOIDECTOMY WITH MYRINGOTOMY    . TUBAL LIGATION      SOCIAL HISTORY: Social History   Social History  . Marital status: Widowed    Spouse name: N/A  . Number of children: 5  . Years of education: N/A   Occupational History  . 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 Smoking status: Never Smoker  . Smokeless tobacco: Never Used  . Alcohol use Yes     Comment: social drinker   . Drug use: No  . Sexual activity: Not on file   Other Topics Concern  . Not on file   Social History Narrative  . No narrative on file    FAMILY HISTORY: Family History  Problem Relation Age of Onset  . Diabetes Mother   . Diabetes Father   . Skin cancer Father 40    NOS type; worked as a Theme park manager for many years  . Hypertension Other   . Breast cancer Cousin 18    paternal 1st cousin; w/ mets to bone  . Breast cancer Cousin     paternal 1st cousin dx 65-50; s/p lump  . Breast cancer Cousin     paternal 1st cousin dx 11-53 w/ mets to LN; s/p BL mastectomies  . Heart disease Maternal Aunt 58  . Colon cancer Paternal Uncle     dx 67-68  . Goiter Maternal Grandmother     d. bled to death following goiter surgery  . Heart attack Maternal  Grandfather     d. early 10s  . Lung cancer Paternal Grandmother     dx 15s; d. 88y  . Diabetes Paternal Grandmother   . Heart failure Paternal Grandfather     d. 75-76  . Alzheimer's disease Paternal Grandfather   . Other Daughter 33    daughter w/ hx benign breast lump/cyst removed  . Epilepsy Maternal Aunt   . Alzheimer's disease Paternal Aunt   . Brain cancer Cousin 93    paternal 1st cousin; d. 47y; NOS type    ALLERGIES:  is allergic to banana; sulfur; and tramadol.  MEDICATIONS:  Current Outpatient Prescriptions  Medication Sig Dispense Refill  . ALPRAZolam (XANAX) 0.5 MG tablet Take 1 tablet (0.5 mg total) by mouth at bedtime as needed for anxiety. 20 tablet 0  . docusate sodium (COLACE)  50 MG capsule Take 50 mg by mouth 2 (two) times daily.    Marland Kitchen ibuprofen (ADVIL,MOTRIN) 200 MG tablet Take 800 mg by mouth every 6 (six) hours as needed for headache, mild pain or moderate pain.    Marland Kitchen lidocaine-prilocaine (EMLA) cream Apply 1 application topically as needed. 30 g 1  . LORazepam (ATIVAN) 0.5 MG tablet Take 0.5 mg by mouth every 6 (six) hours as needed for anxiety (and-or nausea/vomiting).    . magic mouthwash SOLN Take 5 mLs by mouth 4 (four) times daily as needed for mouth pain. 240 mL 0  . ondansetron (ZOFRAN) 8 MG tablet Take 1 tablet (8 mg total) by mouth 2 (two) times daily as needed for nausea or vomiting. 20 tablet 1  . oxyCODONE-acetaminophen (PERCOCET/ROXICET) 5-325 MG tablet Take 1-2 tablets by mouth every 8 (eight) hours as needed for severe pain. 30 tablet 0  . prochlorperazine (COMPAZINE) 10 MG tablet Take 1 tablet (10 mg total) by mouth every 6 (six) hours as needed for nausea or vomiting. 30 tablet 1  . venlafaxine XR (EFFEXOR-XR) 37.5 MG 24 hr capsule Take 1 capsule (37.5 mg total) by mouth daily with breakfast. 30 capsule 0  . zolpidem (AMBIEN) 5 MG tablet Take 1 tablet (5 mg total) by mouth at bedtime as needed for sleep. 15 tablet 0  . ciprofloxacin (CIPRO) 500  MG tablet Take 1 tablet (500 mg total) by mouth 2 (two) times daily. 14 tablet 0  . nystatin cream (MYCOSTATIN) Apply 1 application topically 2 (two) times daily. 30 g 0   No current facility-administered medications for this visit.     REVIEW OF SYSTEMS:   Constitutional: Denies fevers, chills (+) fatigue Eyes: Denies blurriness of vision, double vision or watery eyes Ears, nose, mouth, throat, and face: Denies mucositis or sore throat  Respiratory: Denies dyspnea, wheezes, and cough  Cardiovascular: Denies palpitation, chest discomfort or lower extremity swelling Gastrointestinal:  Denies nausea, heartburn or change in bowel habits Skin: Denies abnormal skin rashes Lymphatics: Denies new lymphadenopathy or easy bruising Neurological:Denies new weaknesses (+) tingling/numbness on edges of fingertips and toes Behavioral/Psych: Mood is stable, no new changes  All other systems were reviewed with the patient and are negative.  PHYSICAL EXAMINATION: ECOG PERFORMANCE STATUS: 1  Vitals:   11/22/16 0852  BP: (!) 125/52  Pulse: 75  Resp: 16  Temp: 98.7 F (37.1 C)   Filed Weights   11/22/16 0852  Weight: 177 lb 4.8 oz (80.4 kg)    GENERAL:alert, no distress and comfortable SKIN: skin color, texture, turgor are normal, no rashes or significant lesions EYES: normal, conjunctiva are pink and non-injected, sclera clear OROPHARYNX:no exudate, no erythema and lips, buccal mucosa, and tongue normal  NECK: supple, thyroid normal size, non-tender, without nodularity LYMPH:  no palpable lymphadenopathy in the cervical, axillary or inguinal LUNGS: clear to auscultation and percussion with normal breathing effort HEART: regular rate & rhythm and no murmurs and no lower extremity edema ABDOMEN:abdomen soft, non-tender and normal bowel sounds Musculoskeletal:no cyanosis of digits and no clubbing  PSYCH: alert & oriented x 3 with fluent speech NEURO: no focal motor/sensory  deficits   LABORATORY DATA:  I have reviewed the data as listed CBC Latest Ref Rng & Units 11/22/2016 11/13/2016 11/08/2016  WBC 3.9 - 10.3 10e3/uL 1.6(L) 7.4 5.0  Hemoglobin 11.6 - 15.9 g/dL 12.1 13.6 13.5  Hematocrit 34.8 - 46.6 % 34.9 41.0 40.2  Platelets 145 - 400 10e3/uL 191 173 204   CMP  Latest Ref Rng & Units 11/22/2016 11/13/2016 11/08/2016  Glucose 70 - 140 mg/dl 89 120 164(H)  BUN 7.0 - 26.0 mg/dL 7.6 15.1 11.9  Creatinine 0.6 - 1.1 mg/dL 0.7 0.7 0.7  Sodium 136 - 145 mEq/L 137 136 140  Potassium 3.5 - 5.1 mEq/L 4.4 4.1 3.6  Chloride 101 - 111 mmol/L - - -  CO2 22 - 29 mEq/L _0 Calcium 8.4 - 10.4 mg/dL 9.3 9.6 9.3  Total Protein 6.4 - 8.3 g/dL 7.3 7.7 7.7  Total Bilirubin 0.20 - 1.20 mg/dL <0.22 0.29 0.22  Alkaline Phos 40 - 150 U/L 78 87 83  AST 5 - 34 U/L _1 ALT 0 - 55 U/L _2 PATHOLOGY REPORTS: Diagnosis 09/12/2016 1. Lymph node, needle/core biopsy, right axilla - METASTATIC CARCINOMA WITH EXTRACAPSULAR EXTENSION. 2. Breast, right, needle core biopsy, 8 o'clock - INVASIVE DUCTAL CARCINOMA, GRADE 3.  Microscopic Comment 2. The findings are consistent with grade 3 ductal carcinoma. Breast prognostic profile will be performed.  1. PROGNOSTIC INDICATORS Results: IMMUNOHISTOCHEMICAL AND MORPHOMETRIC ANALYSIS PERFORMED MANUALLY Estrogen Receptor: 100%, POSITIVE, STRONG STAINING INTENSITY Progesterone Receptor: 100%, POSITIVE, STRONG STAINING INTENSITY Proliferation Marker Ki67: 15%  Results: HER2 - NEGATIVE RATIO OF HER2/CEP17 SIGNALS 1.52 AVERAGE HER2 COPY NUMBER PER CELL 2.35  2. PROGNOSTIC INDICATORS Results: IMMUNOHISTOCHEMICAL AND MORPHOMETRIC ANALYSIS PERFORMED MANUALLY Estrogen Receptor: 90%, POSITIVE, STRONG STAINING INTENSITY Progesterone Receptor: 90%,  Proliferation Marker Ki67: 70%  Results: HER2 - NEGATIVE RATIO OF HER2/CEP17 SIGNALS 1.09 AVERAGE HER2 COPY NUMBER PER CELL 1.90  RADIOGRAPHIC STUDIES: I have  personally reviewed the radiological images as listed and agreed with the findings in the report. No results found.  ASSESSMENT & PLAN:  50 y.o. premenopausal Caucasian female, presented with a palpable right breast mass.  1. 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 -Due to the clinically rapid growing tumor, moderate size, positive lymph nodes, high-grade, high Ki-67, this is likely a aggressive tumor, the risk of recurrence after surgery is likely high. -I recommend her to consider neoadjuvant chemotherapy, to shrink the tumor, make the lumpectomy more feasible, and potentially avoid axillary lymph node dissection. -Giving the ER/PR strongly positive, HER-2 negative disease, I recommend her to have dose dense Adriamycin and Cytoxan, every 2 weeks for 4 cycles, followed by weekly Taxol for 12 weeks, as neoadjuvant therapy. -She tolerated the second cycle AC better without Neulasta but had neutropenia and cold and cycle 3 was postponed for one week -ANC is too low to treat. Will hold her fourth cycle of chemotherapy and delayed by one week. -Counts reviewed with Dr. Benay Spice and Dr. Ernestina Penna absence. Will hold off on giving additional Granix at this point. -Cipro 500 mg twice a day for 7 days was given for antibiotic prophylaxis. Neutropenic precautions discussed with the patient and she was instructed to call for fever greater than or equal to 100.5.  2. Genetics  -Her young age and strong family history of breast cancer, we'll refer her to his genetics for testing, to rule out inheritable genetic syndrome.  3. Anxiety  and insomnia -She has been quite anxious since  cancer diagnosis, but does not feel she  needs medication -She has tried Ambien, does not like it  -she takes lorazepam 0.5 mg as needed for insomnia and anxiety    4. Night sweats  -Probably related to chemotherapy and chemotherapy-induced over suppression/menopause -She also has significant anxiety, I recommend her to try Effexor, benefit and potential side effects, we'll discussed with patient, she agrees to proceed. I'll start her on low-dose 37.72m daily, and titrate if needed -I encouraged her to drink water  -Night sweats improved with Effexor  5. Nausea and vomiting -I encouraged her to use Compazine and Zofran, at least 3 times a day before meals, and as needed -she does not want to try dexa, due to her insomnia, she also has ativan as needed but does not take much   6. Angular cheilosis -Nystatin cream to be applied to the corners of her mouth twice a day as needed  Plan  -Lab reviewed, hold chemotherapy today -Cipro as above. -Neutropenic precautions discussed. -Rescheduled for fourth cycle of before meals in one week. Will see M.D. on that day. -Granix extra appointment entered for 11/30/2016 and 12/02/2016. IV fluids scheduled for 12/02/2016.   Orders Placed This Encounter  Procedures  . CBC with Differential/Platelet    Standing Status:   Future    Standing Expiration Date:   11/22/2017  . Comprehensive metabolic panel    Standing Status:   Future    Standing Expiration Date:   11/22/2017    All questions were answered. The patient knows to call the clinic with any problems, questions or concerns.  I spent 20 minutes counseling the patient face to face. The total time spent in the appointment was 30 minutes and more than 50% was on counseling.     CMikey Bussing NP 11/22/2016

## 2016-11-22 NOTE — Telephone Encounter (Signed)
Oncology treatment plan changed, per 11/22/16 los. Appointments scheduled per 11/22/16 los. Patient was given a copy of the AVS report and updated appointment schedule, per 11/22/16 los.

## 2016-11-22 NOTE — Progress Notes (Signed)
Patient was identified to be at risk for malnutrition and scheduled to be seen in chemo room. Treatment today was cancelled so nutrition appointment rescheduled.

## 2016-11-23 ENCOUNTER — Ambulatory Visit: Payer: 59

## 2016-11-26 NOTE — Telephone Encounter (Signed)
Revealed negative genetic testing on the Breast/Ovarian cancer panel.  Discussed that we did not know why there is cancer in the family or why she developed breast cancer.  Explained that her daughters should consider screening about 10 years younger than her age of onset.  Please stay in contact with genetics so that if updated testing is needed in the future it can be offered.

## 2016-11-27 ENCOUNTER — Ambulatory Visit: Payer: Self-pay | Admitting: Genetic Counselor

## 2016-11-27 DIAGNOSIS — Z1379 Encounter for other screening for genetic and chromosomal anomalies: Secondary | ICD-10-CM

## 2016-11-27 DIAGNOSIS — Z170 Estrogen receptor positive status [ER+]: Secondary | ICD-10-CM

## 2016-11-27 DIAGNOSIS — C50511 Malignant neoplasm of lower-outer quadrant of right female breast: Secondary | ICD-10-CM

## 2016-11-27 DIAGNOSIS — Z803 Family history of malignant neoplasm of breast: Secondary | ICD-10-CM

## 2016-11-27 NOTE — Progress Notes (Signed)
[place]  Telephone:([phone]) [phone] Fax:(336) [phone]  Clinic Follow up Note   Patient Care Team: No Pcp Per Patient as PCP - General (General Practice) Rolm Bookbinder, MD as Consulting Physician (General Surgery) Truitt Merle, MD as Consulting Physician (Hematology) Kyung Rudd, MD as Consulting Physician (Radiation Oncology) 11/29/2016  CHIEF COMPLAINTS:  Follow up right breast cancer  Oncology History   Malignant neoplasm of lower-outer quadrant of right female breast [facility])   Staging form: Breast, AJCC 7th Edition   - Clinical stage from 09/12/2016: Stage IIB (T2, N1, M0) - Signed by Truitt Merle, MD on 09/18/2016      Malignant neoplasm of lower-outer quadrant of right female breast ([place])   09/10/2016 Mammogram    Mammogram and ultrasound showed a 2.7 cm mass at 8 clock position of the right breast, there is also a lobulated lymph nodes in the right axilla, slightly suspicious for malignancy.       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  Negative genetic testing for the MSH2 inversion analysis ([place] inversion). The Breast/Ovarian gene panel offered by [facility] includes sequencing and rearrangement analysis for the following 20 genes:  ATM, BARD1, BRCA1, BRCA2, BRIP1, CDH1, CHEK2, EPCAM, FANCC, MLH1, MSH2, MSH6, NBN,  PALB2, PMS2, PTEN, RAD51C, RAD51D, TP53, and XRCC2.   The report date is December 19 for the [facility] panel and November 20, 2016 for the [place] inversion.        HISTORY OF PRESENTING ILLNESS:  April Orozco 51 y.o. female is here because of her recently diagnosed right breast cancer. She is accompanied by her boyfriend to our multidisciplinary breast clinic today.   She noticed a right breast lump 3 monthsa ago, no pain or tenderness, no skin change or nipple discharge. She feels the lump has been getting bigger over the past 3 months. She called her gynecologist and was finally seen a few weeks ago. She was referred to have a diagnostic mammogram and ultrasound which showed a 2.7 cm mass at the 8:00 position of the right breast, and also a lobulated lymph nodes in the right axilla. 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  She feels well, denies any skin pain, or other symptoms. No recent weight loss. 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 Her husband died from small cell cancer several years ago, she has 5 daughters. She recently found out that 3 of her paternal cousins had breast cancer.  GYN HISTORY  Menarchal: 17 LMP: regular  Contraceptive: no  HRT: n/a  G5P5: 5 daughters 30-19  CURRENT THERAPY: Neoadjuvant Adriamycin and Cytoxan, every 2 weeks, with  Neulasta, for total 4 cycles, followed by weekly Taxol for 12 weeks  INTERIM HISTORY: Lynnette returns for follow-up and treatment. The patient feels better this week after not having chemo last week. Appetite has been good this week. She reports she's eating protein, vegetables, and fruit tastes good. Reports her left thigh has a numbing sensation and is cold sensitive. She is limping on that side. Denies prior injuries of the left hip or knee. She's  not sure if she sleeps on that side. Numbness of her fingertips and toes, able to perform normal hand functions. Reports fatigue. She is working 10-12 hours a day. Only takes Percocet when she's in pain. The patient still has some nausea. She started Cipro last Saturday. She no longer has mouth sores.  MEDICAL HISTORY:  Past Medical History:  Diagnosis Date  . Fracture, sacrum/coccyx ([place]) 1970   "Shattered tailbone"  . Heart murmur    [facility] has evaluated, felt benign  . Malignant neoplasm of lower-outer quadrant of right female breast ([place]) 09/13/2016  . Tachycardia    Intermittent, resolved with decreased caffeine intake    SURGICAL HISTORY: Past Surgical History:  Procedure Laterality Date  . APPENDECTOMY    . PORTACATH PLACEMENT Right 09/23/2016   Procedure: INSERTION PORT-A-CATH WITH [place];  Surgeon: Rolm Bookbinder, MD;  Location: [place];  Service: General;  Laterality: Right;  . TONSILECTOMY/ADENOIDECTOMY WITH MYRINGOTOMY    . TUBAL LIGATION      SOCIAL HISTORY: Social History   Social History  . Marital status: Widowed    Spouse name: N/A  . Number of children: 5  . Years of education: N/A   Occupational History  . 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 Smoking status: Never Smoker  . Smokeless tobacco: Never Used  . Alcohol use Yes     Comment: social drinker   . Drug use: No  . Sexual activity: Not on file   Other Topics Concern  . Not on file   Social History Narrative  . No narrative on file    FAMILY HISTORY: Family History  Problem Relation Age of Onset  . Diabetes Mother   . Diabetes Father   . Skin cancer Father 79    NOS type; worked as a Theme park manager for many years  . Hypertension Other   . Breast cancer Cousin 33    paternal 1st cousin; w/ mets to bone  . Breast cancer Cousin     paternal 1st cousin dx 42-50; s/p lump  . Breast cancer Cousin     paternal 1st cousin dx 15-53 w/ mets  to LN; s/p BL mastectomies  . Heart disease Maternal Aunt 58  . Colon cancer Paternal Uncle     dx 67-68  . Goiter Maternal Grandmother     d. bled to death following goiter surgery  . Heart attack Maternal Grandfather     d. early 39s  . Lung cancer Paternal Grandmother     dx 54s; d. 88y  . Diabetes Paternal Grandmother   . Heart failure Paternal Grandfather     d. 75-76  . Alzheimer's disease Paternal Grandfather   . Other Daughter 65    daughter w/ hx benign breast lump/cyst removed  . Epilepsy Maternal Aunt   . Alzheimer's disease Paternal Aunt   . Brain cancer Cousin 60    paternal 1st cousin; d. 30y; NOS type    ALLERGIES:  is allergic  to banana; sulfur; and tramadol.  MEDICATIONS:  Current Outpatient Prescriptions  Medication Sig Dispense Refill  . ALPRAZolam (XANAX) 0.5 MG tablet Take 1 tablet (0.5 mg total) by mouth at bedtime as needed for anxiety. 20 tablet 0  . ciprofloxacin (CIPRO) 500 MG tablet Take 1 tablet (500 mg total) by mouth 2 (two) times daily. 14 tablet 0  . docusate sodium (COLACE) 50 MG capsule Take 50 mg by mouth 2 (two) times daily.    Marland Kitchen ibuprofen (ADVIL,MOTRIN) 200 MG tablet Take 800 mg by mouth every 6 (six) hours as needed for headache, mild pain or moderate pain.    Marland Kitchen lidocaine-prilocaine (EMLA) cream Apply 1 application topically as needed. 30 g 1  . LORazepam (ATIVAN) 0.5 MG tablet Take 0.5 mg by mouth every 6 (six) hours as needed for anxiety (and-or nausea/vomiting).    . magic mouthwash SOLN Take 5 mLs by mouth 4 (four) times daily as needed for mouth pain. 240 mL 0  . nystatin cream (MYCOSTATIN) Apply 1 application topically 2 (two) times daily. 30 g 0  . ondansetron (ZOFRAN) 8 MG tablet Take 1 tablet (8 mg total) by mouth 2 (two) times daily as needed for nausea or vomiting. 20 tablet 1  . oxyCODONE-acetaminophen (PERCOCET/ROXICET) 5-325 MG tablet Take 1-2 tablets by mouth every 8 (eight) hours as needed for severe pain. 30 tablet 0  .  prochlorperazine (COMPAZINE) 10 MG tablet Take 1 tablet (10 mg total) by mouth every 6 (six) hours as needed for nausea or vomiting. 30 tablet 1  . venlafaxine XR (EFFEXOR-XR) 37.5 MG 24 hr capsule Take 1 capsule (37.5 mg total) by mouth daily with breakfast. 30 capsule 0  . zolpidem (AMBIEN) 5 MG tablet Take 1 tablet (5 mg total) by mouth at bedtime as needed for sleep. 30 tablet 1   No current facility-administered medications for this visit.     REVIEW OF SYSTEMS:   Constitutional: Denies fevers, chills (+) cold sweats (+) fatigue Eyes: Denies blurriness of vision, double vision or watery eyes Ears, nose, mouth, throat, and face: Denies mucositis or sore throat Respiratory: Denies dyspnea or wheezes Cardiovascular: Denies palpitation, chest discomfort or lower extremity swelling Gastrointestinal:  Denies nausea, heartburn or change in bowel habits Skin: Denies abnormal skin rashes Lymphatics: Denies new lymphadenopathy or easy bruising Neurological:Denies new weaknesses (+) tingling/numbness on edges of fingertips and toes Behavioral/Psych: Mood is stable, no new changes  All other systems were reviewed with the patient and are negative.  PHYSICAL EXAMINATION: ECOG PERFORMANCE STATUS: 1  Vitals:   11/29/16 1415  BP: (!) 119/57  Pulse: 67  Resp: 17  Temp: 98.8 F (37.1 C)   Filed Weights   11/29/16 1415  Weight: 178 lb 3.2 oz (80.8 kg)    GENERAL:alert, no distress and comfortable SKIN: skin color, texture, turgor are normal, no rashes or significant lesions EYES: normal, conjunctiva are pink and non-injected, sclera clear OROPHARYNX:no exudate, no erythema and lips, buccal mucosa, and tongue normal  NECK: supple, thyroid normal size, non-tender, without nodularity LYMPH:  no palpable lymphadenopathy in the cervical, axillary or inguinal LUNGS: clear to auscultation and percussion with normal breathing effort HEART: regular rate & rhythm and no murmurs and no lower  extremity edema ABDOMEN:abdomen soft, non-tender and normal bowel sounds Musculoskeletal:no cyanosis of digits and no clubbing  PSYCH: alert & oriented x 3 with fluent speech NEURO: no focal motor/sensory deficits   LABORATORY DATA:  I have reviewed the data as listed CBC Latest Ref  Rng & Units 11/29/2016 11/22/2016 11/13/2016  WBC 3.9 - 10.3 10e3/uL 5.0 1.6(L) 7.4  Hemoglobin 11.6 - 15.9 g/dL 11.9 12.1 13.6  Hematocrit 34.8 - 46.6 % 34.6(L) 34.9 41.0  Platelets 145 - 400 10e3/uL 229 191 173   CMP Latest Ref Rng & Units 11/29/2016 11/22/2016 11/13/2016  Glucose 70 - 140 mg/dl 113 89 120  BUN 7.0 - 26.0 mg/dL 11.2 7.6 15.1  Creatinine 0.6 - 1.1 mg/dL 0.7 0.7 0.7  Sodium 136 - 145 mEq/L 139 137 136  Potassium 3.5 - 5.1 mEq/L 4.1 4.4 4.1  Chloride 101 - 111 mmol/L - - -  CO2 22 - 29 mEq/L 28 25 23   Calcium 8.4 - 10.4 mg/dL 9.5 9.3 9.6  Total Protein 6.4 - 8.3 g/dL 7.2 7.3 7.7  Total Bilirubin 0.20 - 1.20 mg/dL 0.22 <0.22 0.29  Alkaline Phos 40 - 150 U/L 71 78 87  AST 5 - 34 U/L 21 15 13   ALT 0 - 55 U/L 27 20 18      PATHOLOGY REPORTS: Diagnosis 09/12/2016 1. Lymph node, needle/core biopsy, right axilla - METASTATIC CARCINOMA WITH EXTRACAPSULAR EXTENSION. 2. Breast, right, needle core biopsy, 8 o'clock - INVASIVE DUCTAL CARCINOMA, GRADE 3.  Microscopic Comment 2. The findings are consistent with grade 3 ductal carcinoma. Breast prognostic profile will be performed.  1. PROGNOSTIC INDICATORS Results: IMMUNOHISTOCHEMICAL AND MORPHOMETRIC ANALYSIS PERFORMED MANUALLY Estrogen Receptor: 100%, POSITIVE, STRONG STAINING INTENSITY Progesterone Receptor: 100%, POSITIVE, STRONG STAINING INTENSITY Proliferation Marker Ki67: 15%  Results: HER2 - NEGATIVE RATIO OF HER2/CEP17 SIGNALS 1.52 AVERAGE HER2 COPY NUMBER PER CELL 2.35  2. PROGNOSTIC INDICATORS Results: IMMUNOHISTOCHEMICAL AND MORPHOMETRIC ANALYSIS PERFORMED MANUALLY Estrogen Receptor: 90%, POSITIVE, STRONG STAINING  INTENSITY Progesterone Receptor: 90%,  Proliferation Marker Ki67: 70%  Results: HER2 - NEGATIVE RATIO OF HER2/CEP17 SIGNALS 1.09 AVERAGE HER2 COPY NUMBER PER CELL 1.90  RADIOGRAPHIC STUDIES: I have personally reviewed the radiological images as listed and agreed with the findings in the report. No results found.  ASSESSMENT & PLAN:  51 y.o. premenopausal Caucasian female, presented with a palpable right breast mass.  1. 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 -Due to the clinically rapid growing tumor, moderate size, positive lymph nodes, high-grade, high Ki-67, this is likely a aggressive tumor, the risk of recurrence after surgery is likely high. -I recommend her to consider neoadjuvant chemotherapy, to shrink the tumor, make the lumpectomy more feasible, and potentially avoid axillary lymph node dissection. -Giving the ER/PR strongly positive, HER-2 negative disease, I recommend her to have dose dense Adriamycin and Cytoxan, every 2 weeks for 4 cycles, followed by weekly Taxol for 12 weeks, as neoadjuvant therapy. -She tolerated the second cycle AC better without Neulasta but had neutropenia (ANC 0.9K on 11/01/16) and cycle 3 was postponed for one week -ANC on 11/22/16 was 0.2K. Her 4th cycle was held for a week. Cipro 500 mg BID for 7 days was given for antibiotic prophylaxis. Neutropenic precautions discussed and to call if she develops a fever greater than or equal to 100.5. -Labs reviewed with patient, appropriate to have cycle 4 of AC today, with neulasta on day 2  -Potential side effects from chemotherapy reviewed with her again, she knows to  call us if she  has questions -will start weekly taxol in 2 weeks. .   2. Genetics  -Her young age and strong family history of breast cancer, we'll refer her to his genetics for testing, to rule out inheritable genetic syndrome.  3. Anxiety  and insomnia -She has been quite anxious since cancer diagnosis, but does not feel she needs medication -She has tried Ambien, do not like it  -continue lorazepam 0.5 mg as needed for insomnia and anxiety    4. Cold symptoms -She had cold symptoms last week, when she was neutroepnic, most resolved now. ANC 2.8K today. No fever or productive cough  She will contact our clinic if she develops fever or worsening symptoms.  5. Neuropathy, G1 -Chemotherapy induced. -Numbness of her fingers and toes. -The patient has numbness of her left leg, I advised her to not sleep on that side.  Plan  -Lab reviewed with patient, [place] 2.8K. -Will undergo neoadjuvant chemotherapy C4 AC today with Neulasta (onpro) -Lab, flush, f/u, and weekly taxol in 2 and 3 weeks. -Cancel Granix appointments on 1/6 and 1/8. -Ambien, compazine, and Percocet refilled.  No orders of the defined types were placed in this encounter.   All questions were answered. The patient knows to call the clinic with any problems, questions or concerns.  I spent 20 minutes counseling the patient face to face. The total time spent in the appointment was 25 minutes and more than 50% was on counseling.    Truitt Merle, MD 11/29/2016   This document serves as a record of services personally performed by Truitt Merle, MD. It was created on her behalf by Darcus Austin, a trained medical scribe. The creation of this record is based on the scribe's personal observations and the provider's statements to them. This document has been checked and approved by the attending provider.

## 2016-11-27 NOTE — Progress Notes (Addendum)
HPI: April Orozco was previously seen in the [place] clinic due to a personal and family history of cancer and concerns regarding a hereditary predisposition to cancer. Please refer to our prior cancer genetics clinic note for more information regarding April Orozco's medical, social and family histories, and our assessment and recommendations, at the time. April Orozco recent genetic test results were disclosed to her, as were recommendations warranted by these results. These results and recommendations are discussed in more detail below.  CANCER HISTORY:  Oncology History   Malignant neoplasm of lower-outer quadrant of right female breast [facility])   Staging form: Breast, AJCC 7th Edition   - Clinical stage from 09/12/2016: Stage IIB (T2, N1, M0) - Signed by Truitt Merle, MD on 09/18/2016      Malignant neoplasm of lower-outer quadrant of right female breast ([place])   09/10/2016 Mammogram    Mammogram and ultrasound showed a 2.7 cm mass at 8 clock position of the right breast, there is also a lobulated lymph nodes in the right axilla, slightly suspicious for malignancy.       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  Negative genetic testing for the MSH2 inversion analysis ([place] inversion). The Breast/Ovarian gene panel offered by [facility]  includes sequencing and rearrangement analysis for the following 20 genes:  ATM, BARD1, BRCA1, BRCA2, BRIP1, CDH1, CHEK2, EPCAM, FANCC, MLH1, MSH2, MSH6, NBN, PALB2, PMS2, PTEN, RAD51C, RAD51D, TP53, and XRCC2.   The report date is December 19 for the [facility] panel and November 20, 2016 for the [place] inversion.       FAMILY HISTORY:  We obtained a detailed, 4-generation family history.  Significant diagnoses are listed below: Family History  Problem Relation Age of Onset  . Diabetes Mother   . Diabetes Father   . Skin cancer Father 63    NOS type; worked as a Theme park manager for many years  . Hypertension Other   . Breast cancer Cousin 66    paternal 1st cousin; w/ mets to bone  . Breast cancer Cousin     paternal 1st cousin dx 63-50; s/p lump  . Breast cancer Cousin     paternal 1st cousin dx 39-53 w/ mets to LN; s/p BL mastectomies  . Heart disease Maternal Aunt 58  . Colon cancer Paternal Uncle     dx 67-68  . Goiter Maternal Grandmother     d. bled to death following goiter surgery  . Heart attack Maternal Grandfather     d. early 80s  . Lung cancer Paternal Grandmother     dx 28s; d. 88y  . Diabetes Paternal Grandmother   . Heart failure Paternal Grandfather     d. 75-76  . Alzheimer's disease Paternal Grandfather   . Other Daughter 57  daughter w/ hx benign breast lump/cyst removed  . Epilepsy Maternal Aunt   . Alzheimer's disease Paternal Aunt   . Brain cancer Cousin 66    paternal 1st cousin; d. 37y; NOS type    April Orozco has five daughters, ages 67-30.  Her oldest daughter had a benign lumpectomy at age 65.  April Orozco currently has one grandson and one granddaughter.  She has one full sister who is currently 42 and has not had cancer.  Her sister has two sons of her own.  April Orozco parents are both currently 80.  Her father has a history of skin cancer diagnosed at age 61.  She reports that her often worked in the sun due to his work as a Theme park manager in [employer] for many  years.    April Orozco mother has two full sisters and two full brothers.  One sister died from heart disease at 33.  The other sister is currently 28-67 and has a history of epilepsy.  One brother passed away at a "young" age from an unspecified cause.  This was the only sibling to have children--he had two daughters who are cancer-free.  The other brother is currently in his early 56's and has never had cancer.  April Orozco's grandmother passed away when her mother was only 75 years old.  She had a goiter for which she underwent surgery; she passed away following her surgery when she "bled to death".  April Orozco's maternal grandfather passed away from a heart attack in his early 38's.  April Orozco has no information for any maternal great aunts/uncles and great grandparents.  April Orozco father has three full brothers and three full sisters, all of whom are currently in their 82's-80's.  One brother was diagnosed with colon cancer at age 28-68.  The other siblings have not had cancer, to April Orozco's knowledge.  Three of April Orozco's paternal first cousins have had breast cancer, all diagnosed in their mid-40's to early 30's.  These cousins are daughters of April Orozco's uncles.  One paternal first cousin was diagnosed with an unspecified type of brain cancer at 6 and passed away at 31.  April Orozco's paternal grandmother was diagnosed with and passed away from lung cancer in her 69's.  April Orozco's paternal grandfather passed away from heart failure and alzheimer's-related causes at age 41-76.  April Orozco has limited information for her paternal great aunts/uncles and great grandparents.  April Orozco is unaware of any previous family history of genetic testing for hereditary cancer risks.  Patient's maternal ancestors are of Caucasian and Native American descent, and paternal ancestors are of Biomedical engineer and English descent. There is no reported Ashkenazi Jewish ancestry. There is no known  consanguinity.  GENETIC TEST RESULTS: Genetic testing reported out on November 12, 2016 through the Breast/Ovarian cancer panel found no deleterious mutations.  The Breast/Ovarian gene panel offered by [facility] includes sequencing and rearrangement analysis for the following 20 genes:  ATM, BARD1, BRCA1, BRCA2, BRIP1, CDH1, CHEK2, EPCAM, FANCC, MLH1, MSH2, MSH6, NBN, PALB2, PMS2, PTEN, RAD51C, RAD51D, TP53, and XRCC2.  Negative genetic testing for the MSH2 inversion analysis ([place] inversion).  The test report has been scanned into EPIC and is located under the Molecular Pathology section of the Results Review tab.   We discussed with April Orozco that since the current genetic testing is not perfect, it is possible there may be a gene mutation in one of these genes that current testing cannot detect, but that  chance is small. We also discussed, that it is possible that another gene that has not yet been discovered, or that we have not yet tested, is responsible for the cancer diagnoses in the family, and it is, therefore, important to remain in touch with cancer genetics in the future so that we can continue to offer Ms. Ramcharan the most up to date genetic testing.   CANCER SCREENING RECOMMENDATIONS:  This result is reassuring and indicates that Ms. Hernon likely does not have an increased risk for a future cancer due to a mutation in one of these genes. This normal test also suggests that Ms. Pujol's cancer was most likely not due to an inherited predisposition associated with one of these genes.  Most cancers happen by chance and this negative test suggests that her cancer falls into this category.  We, therefore, recommended she continue to follow the cancer management and screening guidelines provided by her oncology and primary healthcare provider.   FOLLOW-UP: Lastly, we discussed with Ms. Jeancharles that cancer genetics is a rapidly advancing field and it is possible that new genetic tests will be appropriate  for her and/or her family members in the future. We encouraged her to remain in contact with cancer genetics on an annual basis so we can update her personal and family histories and let her know of advances in cancer genetics that may benefit this family.   Our contact number was provided. Ms. Suchocki questions were answered to her satisfaction, and she knows she is welcome to call us at anytime with additional questions or concerns.   Roma Kayser, MS, [facility] Certified Genetic Counselor [email or web address]

## 2016-11-29 ENCOUNTER — Other Ambulatory Visit (HOSPITAL_BASED_OUTPATIENT_CLINIC_OR_DEPARTMENT_OTHER): Payer: 59

## 2016-11-29 ENCOUNTER — Encounter: Payer: Self-pay | Admitting: Hematology

## 2016-11-29 ENCOUNTER — Ambulatory Visit: Payer: 59

## 2016-11-29 ENCOUNTER — Ambulatory Visit (HOSPITAL_BASED_OUTPATIENT_CLINIC_OR_DEPARTMENT_OTHER): Payer: 59

## 2016-11-29 ENCOUNTER — Ambulatory Visit (HOSPITAL_BASED_OUTPATIENT_CLINIC_OR_DEPARTMENT_OTHER): Payer: 59 | Admitting: Hematology

## 2016-11-29 VITALS — BP 119/57 | HR 67 | Temp 98.8°F | Resp 17 | Ht 64.0 in | Wt 178.2 lb

## 2016-11-29 DIAGNOSIS — C50511 Malignant neoplasm of lower-outer quadrant of right female breast: Secondary | ICD-10-CM

## 2016-11-29 DIAGNOSIS — Z95828 Presence of other vascular implants and grafts: Secondary | ICD-10-CM

## 2016-11-29 DIAGNOSIS — G609 Hereditary and idiopathic neuropathy, unspecified: Secondary | ICD-10-CM

## 2016-11-29 DIAGNOSIS — F419 Anxiety disorder, unspecified: Secondary | ICD-10-CM

## 2016-11-29 DIAGNOSIS — C773 Secondary and unspecified malignant neoplasm of axilla and upper limb lymph nodes: Secondary | ICD-10-CM

## 2016-11-29 DIAGNOSIS — Z5189 Encounter for other specified aftercare: Secondary | ICD-10-CM

## 2016-11-29 DIAGNOSIS — Z170 Estrogen receptor positive status [ER+]: Secondary | ICD-10-CM

## 2016-11-29 DIAGNOSIS — Z5111 Encounter for antineoplastic chemotherapy: Secondary | ICD-10-CM | POA: Diagnosis not present

## 2016-11-29 DIAGNOSIS — G4700 Insomnia, unspecified: Secondary | ICD-10-CM

## 2016-11-29 LAB — CBC WITH DIFFERENTIAL/PLATELET
BASO%: 0.2 % (ref 0.0–2.0)
BASOS ABS: 0 10*3/uL (ref 0.0–0.1)
EOS ABS: 0 10*3/uL (ref 0.0–0.5)
EOS%: 0.8 % (ref 0.0–7.0)
HCT: 34.6 % — ABNORMAL LOW (ref 34.8–46.6)
HGB: 11.9 g/dL (ref 11.6–15.9)
LYMPH%: 27.3 % (ref 14.0–49.7)
MCH: 31.1 pg (ref 25.1–34.0)
MCHC: 34.4 g/dL (ref 31.5–36.0)
MCV: 90.3 fL (ref 79.5–101.0)
MONO#: 0.8 10*3/uL (ref 0.1–0.9)
MONO%: 16.1 % — AB (ref 0.0–14.0)
NEUT#: 2.8 10*3/uL (ref 1.5–6.5)
NEUT%: 55.6 % (ref 38.4–76.8)
Platelets: 229 10*3/uL (ref 145–400)
RBC: 3.83 10*6/uL (ref 3.70–5.45)
RDW: 13.9 % (ref 11.2–14.5)
WBC: 5 10*3/uL (ref 3.9–10.3)
lymph#: 1.4 10*3/uL (ref 0.9–3.3)

## 2016-11-29 LAB — COMPREHENSIVE METABOLIC PANEL
ALBUMIN: 3.9 g/dL (ref 3.5–5.0)
ALT: 27 U/L (ref 0–55)
AST: 21 U/L (ref 5–34)
Alkaline Phosphatase: 71 U/L (ref 40–150)
Anion Gap: 7 mEq/L (ref 3–11)
BUN: 11.2 mg/dL (ref 7.0–26.0)
CHLORIDE: 104 meq/L (ref 98–109)
CO2: 28 meq/L (ref 22–29)
Calcium: 9.5 mg/dL (ref 8.4–10.4)
Creatinine: 0.7 mg/dL (ref 0.6–1.1)
GLUCOSE: 113 mg/dL (ref 70–140)
POTASSIUM: 4.1 meq/L (ref 3.5–5.1)
SODIUM: 139 meq/L (ref 136–145)
Total Bilirubin: 0.22 mg/dL (ref 0.20–1.20)
Total Protein: 7.2 g/dL (ref 6.4–8.3)

## 2016-11-29 MED ORDER — SODIUM CHLORIDE 0.9% FLUSH
10.0000 mL | INTRAVENOUS | Status: DC | PRN
  Administered 2016-11-29: 10 mL via INTRAVENOUS
  Filled 2016-11-29: qty 10

## 2016-11-29 MED ORDER — PROCHLORPERAZINE MALEATE 10 MG PO TABS: 10.0000 mg | ORAL_TABLET | Freq: Four times a day (QID) | ORAL | 1 refills | Status: DC | PRN

## 2016-11-29 MED ORDER — SODIUM CHLORIDE 0.9 % IV SOLN
600.0000 mg/m2 | Freq: Once | INTRAVENOUS | Status: AC
  Administered 2016-11-29: 1160 mg via INTRAVENOUS
  Filled 2016-11-29: qty 58

## 2016-11-29 MED ORDER — PALONOSETRON HCL INJECTION 0.25 MG/5ML
INTRAVENOUS | Status: AC
  Filled 2016-11-29: qty 5

## 2016-11-29 MED ORDER — SODIUM CHLORIDE 0.9 % IV SOLN
Freq: Once | INTRAVENOUS | Status: AC
  Administered 2016-11-29: 15:00:00 via INTRAVENOUS
  Filled 2016-11-29: qty 5

## 2016-11-29 MED ORDER — ZOLPIDEM TARTRATE 5 MG PO TABS: 5.0000 mg | ORAL_TABLET | Freq: Every evening | ORAL | 1 refills | Status: DC | PRN

## 2016-11-29 MED ORDER — PEGFILGRASTIM 6 MG/0.6ML ~~LOC~~ PSKT
6.0000 mg | PREFILLED_SYRINGE | Freq: Once | SUBCUTANEOUS | Status: AC
  Administered 2016-11-29: 6 mg via SUBCUTANEOUS
  Filled 2016-11-29: qty 0.6

## 2016-11-29 MED ORDER — DOXORUBICIN HCL CHEMO IV INJECTION 2 MG/ML
60.0000 mg/m2 | Freq: Once | INTRAVENOUS | Status: AC
  Administered 2016-11-29: 116 mg via INTRAVENOUS
  Filled 2016-11-29: qty 58

## 2016-11-29 MED ORDER — OXYCODONE-ACETAMINOPHEN 5-325 MG PO TABS: 1.0000 | ORAL_TABLET | Freq: Three times a day (TID) | ORAL | 0 refills | Status: DC | PRN

## 2016-11-29 MED ORDER — SODIUM CHLORIDE 0.9 % IV SOLN
Freq: Once | INTRAVENOUS | Status: AC
  Administered 2016-11-29: 15:00:00 via INTRAVENOUS

## 2016-11-29 MED ORDER — PALONOSETRON HCL INJECTION 0.25 MG/5ML
0.2500 mg | Freq: Once | INTRAVENOUS | Status: AC
  Administered 2016-11-29: 0.25 mg via INTRAVENOUS

## 2016-11-29 MED ORDER — SODIUM CHLORIDE 0.9% FLUSH
10.0000 mL | INTRAVENOUS | Status: DC | PRN
Start: 2016-11-29 — End: 2016-11-29
  Administered 2016-11-29: 10 mL
  Filled 2016-11-29: qty 10

## 2016-11-29 MED ORDER — HEPARIN SOD (PORK) LOCK FLUSH 100 UNIT/ML IV SOLN
500.0000 [IU] | Freq: Once | INTRAVENOUS | Status: AC | PRN
  Administered 2016-11-29: 500 [IU]
  Filled 2016-11-29: qty 5

## 2016-11-29 NOTE — Patient Instructions (Signed)
[facility] Discharge Instructions for Patients Receiving Chemotherapy  Today you received the following chemotherapy agents : Adriamycin,  Cytoxan.  To help prevent nausea and vomiting after your treatment, we encourage you to take your nausea medication as prescribed.   If you develop nausea and vomiting that is not controlled by your nausea medication, call the clinic.   BELOW ARE SYMPTOMS THAT SHOULD BE REPORTED IMMEDIATELY:  *FEVER GREATER THAN 100.5 F  *CHILLS WITH OR WITHOUT FEVER  NAUSEA AND VOMITING THAT IS NOT CONTROLLED WITH YOUR NAUSEA MEDICATION  *UNUSUAL SHORTNESS OF BREATH  *UNUSUAL BRUISING OR BLEEDING  TENDERNESS IN MOUTH AND THROAT WITH OR WITHOUT PRESENCE OF ULCERS  *URINARY PROBLEMS  *BOWEL PROBLEMS  UNUSUAL RASH Items with * indicate a potential emergency and should be followed up as soon as possible.  Feel free to call the clinic you have any questions or concerns. The clinic phone number is [phone].  Please show the CHEMO ALERT CARD at check-in to the Emergency Department and triage nurse.   

## 2016-11-30 ENCOUNTER — Other Ambulatory Visit: Payer: Self-pay | Admitting: Hematology

## 2016-11-30 ENCOUNTER — Ambulatory Visit: Payer: 59

## 2016-12-02 ENCOUNTER — Ambulatory Visit: Payer: 59

## 2016-12-02 ENCOUNTER — Telehealth: Payer: Self-pay | Admitting: *Deleted

## 2016-12-02 ENCOUNTER — Encounter: Payer: 59 | Admitting: Nutrition

## 2016-12-02 ENCOUNTER — Encounter: Payer: Self-pay | Admitting: Nutrition

## 2016-12-02 ENCOUNTER — Encounter: Payer: Self-pay | Admitting: *Deleted

## 2016-12-02 NOTE — Telephone Encounter (Signed)
Error

## 2016-12-02 NOTE — Progress Notes (Signed)
Patient was identified to be at risk for malnutrition on the MST secondary to poor appetite and weight loss. Patient did not require infusion today. Chart was reviewed.  Patient no longer meets criteria for malnutrition. She reports good appetite and has gained 2 pounds since December 20. Please contact RD if nutrition consult required.

## 2016-12-03 ENCOUNTER — Telehealth: Payer: Self-pay | Admitting: Hematology

## 2016-12-03 NOTE — Telephone Encounter (Signed)
Left a message for patient to call and confirm her appointments for 1/12

## 2016-12-03 NOTE — Telephone Encounter (Signed)
Correction patient has appointment on 1/19 not 1/12

## 2016-12-08 ENCOUNTER — Other Ambulatory Visit: Payer: Self-pay | Admitting: Hematology

## 2016-12-08 DIAGNOSIS — Z170 Estrogen receptor positive status [ER+]: Principal | ICD-10-CM

## 2016-12-08 DIAGNOSIS — C50511 Malignant neoplasm of lower-outer quadrant of right female breast: Secondary | ICD-10-CM

## 2016-12-09 ENCOUNTER — Other Ambulatory Visit: Payer: Self-pay | Admitting: Hematology

## 2016-12-11 ENCOUNTER — Other Ambulatory Visit: Payer: Self-pay | Admitting: Hematology

## 2016-12-11 ENCOUNTER — Encounter: Payer: Self-pay | Admitting: Hematology

## 2016-12-11 ENCOUNTER — Other Ambulatory Visit: Payer: Self-pay | Admitting: *Deleted

## 2016-12-11 DIAGNOSIS — C50511 Malignant neoplasm of lower-outer quadrant of right female breast: Secondary | ICD-10-CM

## 2016-12-11 DIAGNOSIS — Z170 Estrogen receptor positive status [ER+]: Principal | ICD-10-CM

## 2016-12-11 MED ORDER — ALPRAZOLAM 0.5 MG PO TABS: 0.5000 mg | ORAL_TABLET | Freq: Every evening | ORAL | 0 refills | Status: DC | PRN

## 2016-12-12 ENCOUNTER — Telehealth: Payer: Self-pay | Admitting: *Deleted

## 2016-12-12 NOTE — Telephone Encounter (Signed)
Called pt to discuss pain & she reports she is out of oxycodone & took 2 every 6 hours due to horrible pain from the Neulasta.  She would like to p/u script tomorrow when she comes in.  Discussed with Dr Burr Medico & she will refill at visit tomorrow.

## 2016-12-13 ENCOUNTER — Encounter: Payer: Self-pay | Admitting: Hematology

## 2016-12-13 ENCOUNTER — Ambulatory Visit (HOSPITAL_BASED_OUTPATIENT_CLINIC_OR_DEPARTMENT_OTHER): Payer: 59

## 2016-12-13 ENCOUNTER — Other Ambulatory Visit (HOSPITAL_BASED_OUTPATIENT_CLINIC_OR_DEPARTMENT_OTHER): Payer: 59

## 2016-12-13 ENCOUNTER — Ambulatory Visit: Payer: 59

## 2016-12-13 ENCOUNTER — Encounter: Payer: Self-pay | Admitting: *Deleted

## 2016-12-13 ENCOUNTER — Ambulatory Visit (HOSPITAL_BASED_OUTPATIENT_CLINIC_OR_DEPARTMENT_OTHER): Payer: 59 | Admitting: Hematology

## 2016-12-13 ENCOUNTER — Telehealth: Payer: Self-pay | Admitting: *Deleted

## 2016-12-13 VITALS — BP 119/47 | HR 80 | Temp 98.4°F | Resp 18 | Ht 64.0 in | Wt 177.7 lb

## 2016-12-13 VITALS — BP 119/60 | HR 82 | Temp 99.0°F | Resp 16

## 2016-12-13 DIAGNOSIS — C50511 Malignant neoplasm of lower-outer quadrant of right female breast: Secondary | ICD-10-CM

## 2016-12-13 DIAGNOSIS — F419 Anxiety disorder, unspecified: Secondary | ICD-10-CM

## 2016-12-13 DIAGNOSIS — Z170 Estrogen receptor positive status [ER+]: Principal | ICD-10-CM

## 2016-12-13 DIAGNOSIS — G4700 Insomnia, unspecified: Secondary | ICD-10-CM

## 2016-12-13 DIAGNOSIS — Z5111 Encounter for antineoplastic chemotherapy: Secondary | ICD-10-CM | POA: Diagnosis not present

## 2016-12-13 DIAGNOSIS — C773 Secondary and unspecified malignant neoplasm of axilla and upper limb lymph nodes: Secondary | ICD-10-CM

## 2016-12-13 LAB — CBC WITH DIFFERENTIAL/PLATELET
BASO%: 0.2 % (ref 0.0–2.0)
BASOS ABS: 0 10*3/uL (ref 0.0–0.1)
EOS%: 0.7 % (ref 0.0–7.0)
Eosinophils Absolute: 0.1 10*3/uL (ref 0.0–0.5)
HCT: 33.3 % — ABNORMAL LOW (ref 34.8–46.6)
HGB: 11.3 g/dL — ABNORMAL LOW (ref 11.6–15.9)
LYMPH%: 16.9 % (ref 14.0–49.7)
MCH: 30.6 pg (ref 25.1–34.0)
MCHC: 33.9 g/dL (ref 31.5–36.0)
MCV: 90.2 fL (ref 79.5–101.0)
MONO#: 0.8 10*3/uL (ref 0.1–0.9)
MONO%: 8.7 % (ref 0.0–14.0)
NEUT#: 6.8 10*3/uL — ABNORMAL HIGH (ref 1.5–6.5)
NEUT%: 73.5 % (ref 38.4–76.8)
PLATELETS: 140 10*3/uL — AB (ref 145–400)
RBC: 3.69 10*6/uL — AB (ref 3.70–5.45)
RDW: 14.9 % — ABNORMAL HIGH (ref 11.2–14.5)
WBC: 9.2 10*3/uL (ref 3.9–10.3)
lymph#: 1.6 10*3/uL (ref 0.9–3.3)

## 2016-12-13 LAB — COMPREHENSIVE METABOLIC PANEL
ALBUMIN: 4.1 g/dL (ref 3.5–5.0)
ALK PHOS: 84 U/L (ref 40–150)
ALT: 21 U/L (ref 0–55)
ANION GAP: 9 meq/L (ref 3–11)
AST: 17 U/L (ref 5–34)
BILIRUBIN TOTAL: 0.28 mg/dL (ref 0.20–1.20)
BUN: 13.1 mg/dL (ref 7.0–26.0)
CO2: 24 mEq/L (ref 22–29)
Calcium: 9.5 mg/dL (ref 8.4–10.4)
Chloride: 105 mEq/L (ref 98–109)
Creatinine: 0.7 mg/dL (ref 0.6–1.1)
GLUCOSE: 111 mg/dL (ref 70–140)
POTASSIUM: 3.9 meq/L (ref 3.5–5.1)
SODIUM: 139 meq/L (ref 136–145)
Total Protein: 7.5 g/dL (ref 6.4–8.3)

## 2016-12-13 MED ORDER — HEPARIN SOD (PORK) LOCK FLUSH 100 UNIT/ML IV SOLN
500.0000 [IU] | Freq: Once | INTRAVENOUS | Status: AC | PRN
  Administered 2016-12-13: 500 [IU]
  Filled 2016-12-13: qty 5

## 2016-12-13 MED ORDER — SODIUM CHLORIDE 0.9 % IV SOLN
Freq: Once | INTRAVENOUS | Status: AC
  Administered 2016-12-13: 13:00:00 via INTRAVENOUS

## 2016-12-13 MED ORDER — FAMOTIDINE IN NACL 20-0.9 MG/50ML-% IV SOLN
INTRAVENOUS | Status: AC
  Filled 2016-12-13: qty 50

## 2016-12-13 MED ORDER — SODIUM CHLORIDE 0.9% FLUSH
10.0000 mL | INTRAVENOUS | Status: DC | PRN
  Administered 2016-12-13: 10 mL
  Filled 2016-12-13: qty 10

## 2016-12-13 MED ORDER — DEXAMETHASONE SODIUM PHOSPHATE 10 MG/ML IJ SOLN
10.0000 mg | Freq: Once | INTRAMUSCULAR | Status: AC
  Administered 2016-12-13: 10 mg via INTRAVENOUS

## 2016-12-13 MED ORDER — SODIUM CHLORIDE 0.9 % IV SOLN: 10.0000 mg | Freq: Once | INTRAVENOUS | Status: DC

## 2016-12-13 MED ORDER — PACLITAXEL CHEMO INJECTION 300 MG/50ML
80.0000 mg/m2 | Freq: Once | INTRAVENOUS | Status: AC
  Administered 2016-12-13: 150 mg via INTRAVENOUS
  Filled 2016-12-13: qty 25

## 2016-12-13 MED ORDER — FAMOTIDINE IN NACL 20-0.9 MG/50ML-% IV SOLN
20.0000 mg | Freq: Once | INTRAVENOUS | Status: AC
  Administered 2016-12-13: 20 mg via INTRAVENOUS

## 2016-12-13 MED ORDER — DIPHENHYDRAMINE HCL 50 MG/ML IJ SOLN
INTRAMUSCULAR | Status: AC
  Filled 2016-12-13: qty 1

## 2016-12-13 MED ORDER — OXYCODONE-ACETAMINOPHEN 5-325 MG PO TABS: 1.0000 | ORAL_TABLET | Freq: Three times a day (TID) | ORAL | 0 refills | Status: DC | PRN

## 2016-12-13 MED ORDER — DIPHENHYDRAMINE HCL 50 MG/ML IJ SOLN
25.0000 mg | Freq: Once | INTRAMUSCULAR | Status: AC
  Administered 2016-12-13: 25 mg via INTRAVENOUS

## 2016-12-13 MED ORDER — DEXAMETHASONE SODIUM PHOSPHATE 10 MG/ML IJ SOLN
INTRAMUSCULAR | Status: AC
  Filled 2016-12-13: qty 1

## 2016-12-13 NOTE — Patient Instructions (Signed)
[facility] Discharge Instructions for Patients Receiving Chemotherapy  Today you received the following chemotherapy agents:  Taxol (paclitaxel)  To help prevent nausea and vomiting after your treatment, we encourage you to take your nausea medication as prescribed.   If you develop nausea and vomiting that is not controlled by your nausea medication, call the clinic.   BELOW ARE SYMPTOMS THAT SHOULD BE REPORTED IMMEDIATELY:  *FEVER GREATER THAN 100.5 F  *CHILLS WITH OR WITHOUT FEVER  NAUSEA AND VOMITING THAT IS NOT CONTROLLED WITH YOUR NAUSEA MEDICATION  *UNUSUAL SHORTNESS OF BREATH  *UNUSUAL BRUISING OR BLEEDING  TENDERNESS IN MOUTH AND THROAT WITH OR WITHOUT PRESENCE OF ULCERS  *URINARY PROBLEMS  *BOWEL PROBLEMS  UNUSUAL RASH Items with * indicate a potential emergency and should be followed up as soon as possible.  Feel free to call the clinic you have any questions or concerns. The clinic phone number is [phone].  Please show the [facility] at check-in to the Emergency Department and triage nurse.     Paclitaxel injection What is this medicine? PACLITAXEL (PAK li TAX el) is a chemotherapy drug. It targets fast dividing cells, like cancer cells, and causes these cells to die. This medicine is used to treat ovarian cancer, breast cancer, and other cancers. This medicine may be used for other purposes; ask your health care provider or pharmacist if you have questions. COMMON BRAND NAME(S): Onxol, Taxol What should I tell my health care provider before I take this medicine? They need to know if you have any of these conditions: -blood disorders -irregular heartbeat -infection (especially a virus infection such as chickenpox, cold sores, or herpes) -liver disease -previous or ongoing radiation therapy -an unusual or allergic reaction to paclitaxel, alcohol, polyoxyethylated castor oil, other chemotherapy agents, other medicines, foods,  dyes, or preservatives -pregnant or trying to get pregnant -breast-feeding How should I use this medicine? This drug is given as an infusion into a vein. It is administered in a hospital or clinic by a specially trained health care professional. Talk to your pediatrician regarding the use of this medicine in children. Special care may be needed. Overdosage: If you think you have taken too much of this medicine contact a poison control center or emergency room at once. NOTE: This medicine is only for you. Do not share this medicine with others. What if I miss a dose? It is important not to miss your dose. Call your doctor or health care professional if you are unable to keep an appointment. What may interact with this medicine? Do not take this medicine with any of the following medications: -disulfiram -metronidazole This medicine may also interact with the following medications: -cyclosporine -diazepam -ketoconazole -medicines to increase blood counts like filgrastim, pegfilgrastim, sargramostim -other chemotherapy drugs like cisplatin, doxorubicin, epirubicin, etoposide, teniposide, vincristine -quinidine -testosterone -vaccines -verapamil Talk to your doctor or health care professional before taking any of these medicines: -acetaminophen -aspirin -ibuprofen -ketoprofen -naproxen This list may not describe all possible interactions. Give your health care provider a list of all the medicines, herbs, non-prescription drugs, or dietary supplements you use. Also tell them if you smoke, drink alcohol, or use illegal drugs. Some items may interact with your medicine. What should I watch for while using this medicine? Your condition will be monitored carefully while you are receiving this medicine. You will need important blood work done while you are taking this medicine. This medicine can cause serious allergic reactions. To reduce your risk you  will need to take other medicine(s)  before treatment with this medicine. If you experience allergic reactions like skin rash, itching or hives, swelling of the face, lips, or tongue, tell your doctor or health care professional right away. In some cases, you may be given additional medicines to help with side effects. Follow all directions for their use. This drug may make you feel generally unwell. This is not uncommon, as chemotherapy can affect healthy cells as well as cancer cells. Report any side effects. Continue your course of treatment even though you feel ill unless your doctor tells you to stop. Call your doctor or health care professional for advice if you get a fever, chills or sore throat, or other symptoms of a cold or flu. Do not treat yourself. This drug decreases your body's ability to fight infections. Try to avoid being around people who are sick. This medicine may increase your risk to bruise or bleed. Call your doctor or health care professional if you notice any unusual bleeding. Be careful brushing and flossing your teeth or using a toothpick because you may get an infection or bleed more easily. If you have any dental work done, tell your dentist you are receiving this medicine. Avoid taking products that contain aspirin, acetaminophen, ibuprofen, naproxen, or ketoprofen unless instructed by your doctor. These medicines may hide a fever. Do not become pregnant while taking this medicine. Women should inform their doctor if they wish to become pregnant or think they might be pregnant. There is a potential for serious side effects to an unborn child. Talk to your health care professional or pharmacist for more information. Do not breast-feed an infant while taking this medicine. Men are advised not to father a child while receiving this medicine. This product may contain alcohol. Ask your pharmacist or healthcare provider if this medicine contains alcohol. Be sure to tell all healthcare providers you are taking this  medicine. Certain medicines, like metronidazole and disulfiram, can cause an unpleasant reaction when taken with alcohol. The reaction includes flushing, headache, nausea, vomiting, sweating, and increased thirst. The reaction can last from 30 minutes to several hours. What side effects may I notice from receiving this medicine? Side effects that you should report to your doctor or health care professional as soon as possible: -allergic reactions like skin rash, itching or hives, swelling of the face, lips, or tongue -low blood counts - This drug may decrease the number of white blood cells, red blood cells and platelets. You may be at increased risk for infections and bleeding. -signs of infection - fever or chills, cough, sore throat, pain or difficulty passing urine -signs of decreased platelets or bleeding - bruising, pinpoint red spots on the skin, black, tarry stools, nosebleeds -signs of decreased red blood cells - unusually weak or tired, fainting spells, lightheadedness -breathing problems -chest pain -high or low blood pressure -mouth sores -nausea and vomiting -pain, swelling, redness or irritation at the injection site -pain, tingling, numbness in the hands or feet -slow or irregular heartbeat -swelling of the ankle, feet, hands Side effects that usually do not require medical attention (report to your doctor or health care professional if they continue or are bothersome): -bone pain -complete hair loss including hair on your head, underarms, pubic hair, eyebrows, and eyelashes -changes in the color of fingernails -diarrhea -loosening of the fingernails -loss of appetite -muscle or joint pain -red flush to skin -sweating This list may not describe all possible side effects. Call your doctor for   medical advice about side effects. You may report side effects to FDA at 1-800-FDA-1088. Where should I keep my medicine? This drug is given in a hospital or clinic and will not be  stored at home. NOTE: This sheet is a summary. It may not cover all possible information. If you have questions about this medicine, talk to your doctor, pharmacist, or health care provider.  2017 [employer] (2015-09-12 19:58:00)

## 2016-12-13 NOTE — Progress Notes (Signed)
[place]  [phone] [phone]  Clinic Follow up Note   Patient Care Team: No Pcp Per Patient as PCP - General (General Practice) Rolm Bookbinder, MD as Consulting Physician (General Surgery) Truitt Merle, MD as Consulting Physician (Hematology) Kyung Rudd, MD as Consulting Physician (Radiation Oncology) 12/13/2016  CHIEF COMPLAINTS:  Follow up right breast cancer  Oncology History   Malignant neoplasm of lower-outer quadrant of right female breast [facility])   Staging form: Breast, AJCC 7th Edition   - Clinical stage from 09/12/2016: Stage IIB (T2, N1, M0) - Signed by Truitt Merle, MD on 09/18/2016      Malignant neoplasm of lower-outer quadrant of right female breast ([place])   09/10/2016 Mammogram    Mammogram and ultrasound showed a 2.7 cm mass at 8 clock position of the right breast, there is also a lobulated lymph nodes in the right axilla, slightly suspicious for malignancy.       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  Negative genetic testing for the MSH2 inversion analysis ([place] inversion). The Breast/Ovarian gene panel offered by [facility] includes sequencing and rearrangement analysis for the following 20 genes:  ATM, BARD1, BRCA1, BRCA2, BRIP1, CDH1, CHEK2, EPCAM, FANCC, MLH1, MSH2, MSH6, NBN,  PALB2, PMS2, PTEN, RAD51C, RAD51D, TP53, and XRCC2.   The report date is December 19 for the [facility] panel and November 20, 2016 for the [place] inversion.        HISTORY OF PRESENTING ILLNESS:  April Orozco 51 y.o. female is here because of her recently diagnosed right breast cancer. She is accompanied by her boyfriend to our multidisciplinary breast clinic today.   She noticed a right breast lump 3 monthsa ago, no pain or tenderness, no skin change or nipple discharge. She feels the lump has been getting bigger over the past 3 months. She called her gynecologist and was finally seen a few weeks ago. She was referred to have a diagnostic mammogram and ultrasound which showed a 2.7 cm mass at the 8:00 position of the right breast, and also a lobulated lymph nodes in the right axilla. 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  She feels well, denies any skin pain, or other symptoms. No recent weight loss. 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 Her husband died from small cell cancer several years ago, she has 5 daughters. She recently found out that 3 of her paternal cousins had breast cancer.  GYN HISTORY  Menarchal: 17 LMP: regular  Contraceptive: no  HRT: n/a  G5P5: 5 daughters 30-19  CURRENT THERAPY: Neoadjuvant Adriamycin and Cytoxan, every 2 weeks, with  Neulasta, for total 4 cycles, followed by weekly Taxol for 12 weeks  INTERIM HISTORY: Shannan returns for follow-up and Post-week Taxol. She had a severe bone pain from Neulasta after last cycle chemotherapy, which lasted about 10 days. She had to take Percocet 2-4 tablets a day, and she was not able to go to work last week. She has recovered well. She has mild nose congestion in the past few days, no fever or chills, no cough. She had sick contact of  strep B at her work, she denies sore throat. She complains of persistent night sweats and hot flashes.  MEDICAL HISTORY:  Past Medical History:  Diagnosis Date  . Fracture, sacrum/coccyx ([place]) 1970   "Shattered tailbone"  . Heart murmur    [facility] has evaluated, felt benign  . Malignant neoplasm of lower-outer quadrant of right female breast ([name]) 09/13/2016  . Tachycardia    Intermittent, resolved with decreased caffeine intake    SURGICAL HISTORY: Past Surgical History:  Procedure Laterality Date  . APPENDECTOMY    . PORTACATH PLACEMENT Right 09/23/2016   Procedure: INSERTION PORT-A-CATH WITH [place];  Surgeon: Rolm Bookbinder, MD;  Location: [place];  Service: General;  Laterality: Right;  . TONSILECTOMY/ADENOIDECTOMY WITH MYRINGOTOMY    . TUBAL LIGATION      SOCIAL HISTORY: Social History   Social History  . Marital status: Widowed    Spouse name: N/A  . Number of children: 5  . Years of education: N/A   Occupational History  . 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 Smoking status: Never Smoker  . Smokeless tobacco: Never Used  . Alcohol use Yes     Comment: social drinker   . Drug use: No  . Sexual activity: Not on file   Other Topics Concern  . Not on file   Social History Narrative  . No narrative on file    FAMILY HISTORY: Family History  Problem Relation Age of Onset  . Diabetes Mother   . Diabetes Father   . Skin cancer Father 44    NOS type; worked as a Theme park manager for many years  . Hypertension Other   . Breast cancer Cousin 70    paternal 1st cousin; w/ mets to bone  . Breast cancer Cousin     paternal 1st cousin dx 34-50; s/p lump  . Breast cancer Cousin     paternal 1st cousin dx 53-53 w/ mets to LN; s/p BL mastectomies  . Heart disease Maternal Aunt 58  . Colon cancer Paternal Uncle     dx 67-68  . Goiter Maternal Grandmother     d. bled to death following goiter surgery  . Heart  attack Maternal Grandfather     d. early 21s  . Lung cancer Paternal Grandmother     dx 18s; d. 88y  . Diabetes Paternal Grandmother   . Heart failure Paternal Grandfather     d. 75-76  . Alzheimer's disease Paternal Grandfather   . Other Daughter 89    daughter w/ hx benign breast lump/cyst removed  . Epilepsy Maternal Aunt   . Alzheimer's disease Paternal Aunt   . Brain cancer Cousin 35    paternal 1st cousin; d. 55y; NOS type    ALLERGIES:  is allergic to banana; sulfur; and tramadol.  MEDICATIONS:  Current Outpatient Prescriptions  Medication Sig Dispense Refill  . ALPRAZolam (XANAX) 0.5 MG tablet Take 1 tablet (0.5 mg  total) by mouth at bedtime as needed for anxiety. 20 tablet 0  . ciprofloxacin (CIPRO) 500 MG tablet Take 1 tablet (500 mg total) by mouth 2 (two) times daily. 14 tablet 0  . docusate sodium (COLACE) 50 MG capsule Take 50 mg by mouth 2 (two) times daily.    Marland Kitchen ibuprofen (ADVIL,MOTRIN) 200 MG tablet Take 800 mg by mouth every 6 (six) hours as needed for headache, mild pain or moderate pain.    Marland Kitchen lidocaine-prilocaine (EMLA) cream Apply 1 application topically as needed. 30 g 1  . LORazepam (ATIVAN) 0.5 MG tablet Take 0.5 mg by mouth every 6 (six) hours as needed for anxiety (and-or nausea/vomiting).    . magic mouthwash SOLN Take 5 mLs by mouth 4 (four) times daily as needed for mouth pain. 240 mL 0  . nystatin cream (MYCOSTATIN) Apply 1 application topically 2 (two) times daily. 30 g 0  . ondansetron (ZOFRAN) 8 MG tablet Take 1 tablet (8 mg total) by mouth 2 (two) times daily as needed for nausea or vomiting. 20 tablet 1  . oxyCODONE-acetaminophen (PERCOCET/ROXICET) 5-325 MG tablet Take 1 tablet by mouth every 8 (eight) hours as needed for severe pain. 30 tablet 0  . prochlorperazine (COMPAZINE) 10 MG tablet Take 1 tablet (10 mg total) by mouth every 6 (six) hours as needed for nausea or vomiting. 30 tablet 1  . venlafaxine XR (EFFEXOR-XR) 37.5 MG 24 hr capsule TAKE  ONE CAPSULE BY MOUTH EVERY DAY WITH BREAKFAST 30 capsule 0  . zolpidem (AMBIEN) 5 MG tablet Take 1 tablet (5 mg total) by mouth at bedtime as needed for sleep. 30 tablet 1   No current facility-administered medications for this visit.     REVIEW OF SYSTEMS:   Constitutional: Denies fevers, chills (+) cold sweats (+) fatigue Eyes: Denies blurriness of vision, double vision or watery eyes Ears, nose, mouth, throat, and face: Denies mucositis or sore throat Respiratory: Denies dyspnea or wheezes Cardiovascular: Denies palpitation, chest discomfort or lower extremity swelling Gastrointestinal:  Denies nausea, heartburn or change in bowel habits Skin: Denies abnormal skin rashes Lymphatics: Denies new lymphadenopathy or easy bruising Neurological:Denies new weaknesses (+) tingling/numbness on edges of fingertips and toes Behavioral/Psych: Mood is stable, no new changes  All other systems were reviewed with the patient and are negative.  PHYSICAL EXAMINATION: ECOG PERFORMANCE STATUS: 1  Vitals:   12/13/16 1132  BP: (!) 119/47  Pulse: 80  Resp: 18  Temp: 98.4 F (36.9 C)   Filed Weights   12/13/16 1132  Weight: 177 lb 11.2 oz (80.6 kg)    GENERAL:alert, no distress and comfortable SKIN: skin color, texture, turgor are normal, no rashes or significant lesions EYES: normal, conjunctiva are pink and non-injected, sclera clear OROPHARYNX:no exudate, no erythema and lips, buccal mucosa, and tongue normal  NECK: supple, thyroid normal size, non-tender, without nodularity LYMPH:  no palpable lymphadenopathy in the cervical, axillary or inguinal LUNGS: clear to auscultation and percussion with normal breathing effort HEART: regular rate & rhythm and no murmurs and no lower extremity edema ABDOMEN:abdomen soft, non-tender and normal bowel sounds Musculoskeletal:no cyanosis of digits and no clubbing  PSYCH: alert & oriented x 3 with fluent speech NEURO: no focal motor/sensory  deficits   LABORATORY DATA:  I have reviewed the data as listed CBC Latest Ref Rng & Units 12/13/2016 11/29/2016 11/22/2016  WBC 3.9 - 10.3 10e3/uL 9.2 5.0 1.6(L)  Hemoglobin 11.6 - 15.9 g/dL 11.3(L) 11.9 12.1  Hematocrit 34.8 - 46.6 %  33.3(L) 34.6(L) 34.9  Platelets 145 - 400 10e3/uL 140(L) 229 191   CMP Latest Ref Rng & Units 12/13/2016 11/29/2016 11/22/2016  Glucose 70 - 140 mg/dl 111 113 89  BUN 7.0 - 26.0 mg/dL 13.1 11.2 7.6  Creatinine 0.6 - 1.1 mg/dL 0.7 0.7 0.7  Sodium 136 - 145 mEq/L 139 139 137  Potassium 3.5 - 5.1 mEq/L 3.9 4.1 4.4  Chloride 101 - 111 mmol/L - - -  CO2 22 - 29 mEq/L 24 28 25   Calcium 8.4 - 10.4 mg/dL 9.5 9.5 9.3  Total Protein 6.4 - 8.3 g/dL 7.5 7.2 7.3  Total Bilirubin 0.20 - 1.20 mg/dL 0.28 0.22 <0.22  Alkaline Phos 40 - 150 U/L 84 71 78  AST 5 - 34 U/L 17 21 15   ALT 0 - 55 U/L 21 27 20      PATHOLOGY REPORTS: Diagnosis 09/12/2016 1. Lymph node, needle/core biopsy, right axilla - METASTATIC CARCINOMA WITH EXTRACAPSULAR EXTENSION. 2. Breast, right, needle core biopsy, 8 o'clock - INVASIVE DUCTAL CARCINOMA, GRADE 3.  Microscopic Comment 2. The findings are consistent with grade 3 ductal carcinoma. Breast prognostic profile will be performed.  1. PROGNOSTIC INDICATORS Results: IMMUNOHISTOCHEMICAL AND MORPHOMETRIC ANALYSIS PERFORMED MANUALLY Estrogen Receptor: 100%, POSITIVE, STRONG STAINING INTENSITY Progesterone Receptor: 100%, POSITIVE, STRONG STAINING INTENSITY Proliferation Marker Ki67: 15%  Results: HER2 - NEGATIVE RATIO OF HER2/CEP17 SIGNALS 1.52 AVERAGE HER2 COPY NUMBER PER CELL 2.35  2. PROGNOSTIC INDICATORS Results: IMMUNOHISTOCHEMICAL AND MORPHOMETRIC ANALYSIS PERFORMED MANUALLY Estrogen Receptor: 90%, POSITIVE, STRONG STAINING INTENSITY Progesterone Receptor: 90%,  Proliferation Marker Ki67: 70%  Results: HER2 - NEGATIVE RATIO OF HER2/CEP17 SIGNALS 1.09 AVERAGE HER2 COPY NUMBER PER CELL 1.90  RADIOGRAPHIC STUDIES: I have  personally reviewed the radiological images as listed and agreed with the findings in the report. No results found.  ASSESSMENT & PLAN:  51 y.o. premenopausal Caucasian female, presented with a palpable right breast mass.  1. 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 -Due to the clinically rapid growing tumor, moderate size, positive lymph nodes, high-grade, high Ki-67, this is likely a aggressive tumor, the risk of recurrence after surgery is likely high. -I recommend her to consider neoadjuvant chemotherapy, to shrink the tumor, make the lumpectomy more feasible, and potentially avoid axillary lymph node dissection. -She has completed 4 cycles of Adriamycin and Cytoxan, we'll start weekly Taxol today. Potential side effects from Taxol, especially neuropathy, cold discussed with her again, she voiced good understanding. -She has had mild neuropathy from Adriamycin and Cytoxan, we will watch carefully.  2. Genetics  -Her young age and strong family history of breast cancer, we'll refer her to his genetics for testing, to rule out inheritable genetic syndrome.  3. Anxiety  and insomnia -She has been quite anxious since cancer diagnosis, but does not feel she needs medication -She has tried Ambien, do not like it  -continue lorazepam 0.5 mg as needed for insomnia and anxiety    4. Neuropathy, G1 -Chemotherapy induced. -Numbness of her fingers and toes. -The patient has numbness of her left leg, I advised her to not sleep on that side.  5. Hot flush and night sweats -She recently started Effexor 37.20m daily, we'll continue. If no significant  improvement, I plan to  increase the dose on next visit.  Plan  -Lab reviewed, adequate for treatment, we'll start weekly Taxol today and continue -I'll see her back in 1 week, if she does well, will see her every 2 weeks afterwards  -I refilled her percocet today   No orders of the defined types were placed in this encounter.   All questions were answered. The patient knows to call the clinic with any problems, questions or concerns.  I spent 20 minutes counseling the patient face to face. The total time spent in the appointment was 25 minutes and more than 50% was on counseling.    Truitt Merle, MD 12/13/2016

## 2016-12-13 NOTE — Telephone Encounter (Signed)
Per 1/19 LOS I have scheduled appts and notified the scheduler

## 2016-12-19 NOTE — Progress Notes (Signed)
[place]  [phone]) [phone] [phone]  Clinic Follow up Note   Patient Care Team: No Pcp Per Patient as PCP - General (General Practice) Rolm Bookbinder, MD as Consulting Physician (General Surgery) Truitt Merle, MD as Consulting Physician (Hematology) Kyung Rudd, MD as Consulting Physician (Radiation Oncology) 12/20/2016  CHIEF COMPLAINTS:  Follow up right breast cancer  Oncology History   Malignant neoplasm of lower-outer quadrant of right female breast [facility])   Staging form: Breast, AJCC 7th Edition   - Clinical stage from 09/12/2016: Stage IIB (T2, N1, M0) - Signed by Truitt Merle, MD on 09/18/2016      Malignant neoplasm of lower-outer quadrant of right female breast ([place])   09/10/2016 Mammogram    Mammogram and ultrasound showed a 2.7 cm mass at 8 clock position of the right breast, there is also a lobulated lymph nodes in the right axilla, slightly suspicious for malignancy.       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  Negative genetic testing for the MSH2 inversion analysis ([place] inversion). The Breast/Ovarian gene panel offered by [facility] includes sequencing and rearrangement analysis for the following 20 genes:  ATM, BARD1, BRCA1, BRCA2, BRIP1, CDH1, CHEK2, EPCAM, FANCC, MLH1, MSH2, MSH6, NBN,  PALB2, PMS2, PTEN, RAD51C, RAD51D, TP53, and XRCC2.   The report date is December 19 for the [facility] panel and November 20, 2016 for the [place] inversion.        HISTORY OF PRESENTING ILLNESS:  April Orozco 51 y.o. female is here because of her recently diagnosed right breast cancer. She is accompanied by her boyfriend to our multidisciplinary breast clinic today.   She noticed a right breast lump 3 monthsa ago, no pain or tenderness, no skin change or nipple discharge. She feels the lump has been getting bigger over the past 3 months. She called her gynecologist and was finally seen a few weeks ago. She was referred to have a diagnostic mammogram and ultrasound which showed a 2.7 cm mass at the 8:00 position of the right breast, and also a lobulated lymph nodes in the right axilla. 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  She feels well, denies any skin pain, or other symptoms. No recent weight loss. 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 Her husband died from small cell cancer several years ago, she has 5 daughters. She recently found out that 3 of her paternal cousins had breast cancer.  GYN HISTORY  Menarchal: 17 LMP: regular  Contraceptive: no  HRT: n/a  G5P5: 5 daughters 30-19  CURRENT THERAPY: Neoadjuvant Adriamycin and Cytoxan, every 2 weeks, with  Neulasta, for total 4 cycles, followed by weekly Taxol for 12 weeks  INTERIM HISTORY: April Orozco returns for follow-up and chemo. She tolerated her first dose of Taxol well last week, she did have mild intermittent abdominal cramps, and diarrhea for 2-3 days. She took Imodium which helped, and she has been drinking adequately. She denies any neuropathy or other new symptoms. Her fatigue overall has improved, she is being working full-time.  MEDICAL  HISTORY:  Past Medical History:  Diagnosis Date  . Fracture, sacrum/coccyx ([place]) 1970   "Shattered tailbone"  . Heart murmur    [facility] has evaluated, felt benign  . Malignant neoplasm of lower-outer quadrant of right female breast ([place]) 09/13/2016  . Tachycardia    Intermittent, resolved with decreased caffeine intake    SURGICAL HISTORY: Past Surgical History:  Procedure Laterality Date  . APPENDECTOMY    . PORTACATH PLACEMENT Right 09/23/2016   Procedure: INSERTION PORT-A-CATH WITH [place];  Surgeon: Rolm Bookbinder, MD;  Location: [facility];  Service: General;  Laterality: Right;  . TONSILECTOMY/ADENOIDECTOMY WITH MYRINGOTOMY    . TUBAL LIGATION      SOCIAL HISTORY: Social History   Social History  . Marital status: Widowed    Spouse name: N/A  . Number of children: 5  . Years of education: N/A   Occupational History  . 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 Smoking status: Never Smoker  . Smokeless tobacco: Never Used  . Alcohol use Yes     Comment: social drinker   . Drug use: No  . Sexual activity: Not on file   Other Topics Concern  . Not on file   Social History Narrative  . No narrative on file    FAMILY HISTORY: Family History  Problem Relation Age of Onset  . Diabetes Mother   . Diabetes Father   . Skin cancer Father 13    NOS type; worked as a Theme park manager for many years  . Hypertension Other   . Breast cancer Cousin 43    paternal 1st cousin; w/ mets to bone  . Breast cancer Cousin     paternal 1st cousin dx 39-50; s/p lump  . Breast cancer Cousin     paternal 1st cousin dx 84-53 w/ mets to LN; s/p BL mastectomies  . Heart disease Maternal Aunt 58  . Colon cancer Paternal Uncle     dx 67-68  . Goiter Maternal Grandmother     d. bled to death following goiter surgery  . Heart attack Maternal Grandfather     d. early 37s  . Lung cancer Paternal Grandmother     dx 69s; d. 88y  . Diabetes  Paternal Grandmother   . Heart failure Paternal Grandfather     d. 75-76  . Alzheimer's disease Paternal Grandfather   . Other Daughter 3    daughter w/ hx benign breast lump/cyst removed  . Epilepsy Maternal Aunt   . Alzheimer's disease Paternal Aunt   . Brain cancer Cousin 80    paternal 1st cousin; d. 17y; NOS type    ALLERGIES:  is allergic to banana; sulfur; and tramadol.  MEDICATIONS:  Current Outpatient Prescriptions  Medication Sig Dispense Refill  . ALPRAZolam (XANAX) 0.5 MG tablet Take 1 tablet (0.5 mg total) by mouth at bedtime as needed for anxiety. 20 tablet 0  . docusate sodium (COLACE) 50 MG capsule Take 50 mg by mouth 2 (two) times daily.    Marland Kitchen  ibuprofen (ADVIL,MOTRIN) 200 MG tablet Take 800 mg by mouth every 6 (six) hours as needed for headache, mild pain or moderate pain.    Marland Kitchen lidocaine-prilocaine (EMLA) cream Apply 1 application topically as needed. 30 g 1  . loperamide (IMODIUM) 2 MG capsule Take 2 mg by mouth as needed for diarrhea or loose stools.    Marland Kitchen LORazepam (ATIVAN) 0.5 MG tablet Take 0.5 mg by mouth every 6 (six) hours as needed for anxiety (and-or nausea/vomiting).    . magic mouthwash SOLN Take 5 mLs by mouth 4 (four) times daily as needed for mouth pain. 240 mL 0  . nystatin cream (MYCOSTATIN) Apply 1 application topically 2 (two) times daily. 30 g 0  . ondansetron (ZOFRAN) 8 MG tablet Take 1 tablet (8 mg total) by mouth 2 (two) times daily as needed for nausea or vomiting. 20 tablet 1  . oxyCODONE-acetaminophen (PERCOCET/ROXICET) 5-325 MG tablet Take 1 tablet by mouth every 8 (eight) hours as needed for severe pain. 30 tablet 0  . prochlorperazine (COMPAZINE) 10 MG tablet Take 1 tablet (10 mg total) by mouth every 6 (six) hours as needed for nausea or vomiting. 30 tablet 1  . venlafaxine XR (EFFEXOR-XR) 37.5 MG 24 hr capsule TAKE ONE CAPSULE BY MOUTH EVERY DAY WITH BREAKFAST 30 capsule 0  . zolpidem (AMBIEN) 5 MG tablet Take 1 tablet (5 mg total) by mouth  at bedtime as needed for sleep. 30 tablet 1   No current facility-administered medications for this visit.     REVIEW OF SYSTEMS:   Constitutional: Denies fevers, chills (+) cold sweats (+) fatigue Eyes: Denies blurriness of vision, double vision or watery eyes Ears, nose, mouth, throat, and face: Denies mucositis or sore throat Respiratory: Denies dyspnea or wheezes Cardiovascular: Denies palpitation, chest discomfort or lower extremity swelling Gastrointestinal:  Denies nausea, heartburn or change in bowel habits Skin: Denies abnormal skin rashes Lymphatics: Denies new lymphadenopathy or easy bruising Neurological:Denies new weaknesses (+) tingling/numbness on edges of fingertips and toes Behavioral/Psych: Mood is stable, no new changes  All other systems were reviewed with the patient and are negative.  PHYSICAL EXAMINATION: ECOG PERFORMANCE STATUS: 1  Vitals:   12/20/16 1028 12/20/16 1029  BP: (!) 116/39 (!) 111/54  Pulse: 72   Resp: 20   Temp: 98.6 F (37 C)    Filed Weights   12/20/16 1028  Weight: 181 lb (82.1 kg)    GENERAL:alert, no distress and comfortable SKIN: skin color, texture, turgor are normal, no rashes or significant lesions EYES: normal, conjunctiva are pink and non-injected, sclera clear OROPHARYNX:no exudate, no erythema and lips, buccal mucosa, and tongue normal  NECK: supple, thyroid normal size, non-tender, without nodularity LYMPH:  no palpable lymphadenopathy in the cervical, axillary or inguinal LUNGS: clear to auscultation and percussion with normal breathing effort HEART: regular rate & rhythm and no murmurs and no lower extremity edema ABDOMEN:abdomen soft, non-tender and normal bowel sounds Musculoskeletal:no cyanosis of digits and no clubbing  PSYCH: alert & oriented x 3 with fluent speech NEURO: no focal motor/sensory deficits Breasts: Breast inspection showed them to be symmetrical with no nipple discharge. Palpation of the breasts and  axilla revealed a 1.5cm mass at 8:00 position close to right areola, smaller than before, no other obvious mass that I could appreciate.   LABORATORY DATA:  I have reviewed the data as listed CBC Latest Ref Rng & Units 12/20/2016 12/13/2016 11/29/2016  WBC 3.9 - 10.3 10e3/uL 5.1 9.2 5.0  Hemoglobin 11.6 -  15.9 g/dL 10.6(L) 11.3(L) 11.9  Hematocrit 34.8 - 46.6 % 30.9(L) 33.3(L) 34.6(L)  Platelets 145 - 400 10e3/uL 302 140(L) 229   CMP Latest Ref Rng & Units 12/20/2016 12/13/2016 11/29/2016  Glucose 70 - 140 mg/dl 151(H) 111 113  BUN 7.0 - 26.0 mg/dL 14.0 13.1 11.2  Creatinine 0.6 - 1.1 mg/dL 0.7 0.7 0.7  Sodium 136 - 145 mEq/L 138 139 139  Potassium 3.5 - 5.1 mEq/L 3.8 3.9 4.1  Chloride 101 - 111 mmol/L - - -  CO2 22 - 29 mEq/L _0 Calcium 8.4 - 10.4 mg/dL 9.2 9.5 9.5  Total Protein 6.4 - 8.3 g/dL 7.0 7.5 7.2  Total Bilirubin 0.20 - 1.20 mg/dL 0.22 0.28 0.22  Alkaline Phos 40 - 150 U/L 66 84 71  AST 5 - 34 U/L _1 ALT 0 - 55 U/L _2 PATHOLOGY REPORTS: Diagnosis 09/12/2016 1. Lymph node, needle/core biopsy, right axilla - METASTATIC CARCINOMA WITH EXTRACAPSULAR EXTENSION. 2. Breast, right, needle core biopsy, 8 o'clock - INVASIVE DUCTAL CARCINOMA, GRADE 3.  Microscopic Comment 2. The findings are consistent with grade 3 ductal carcinoma. Breast prognostic profile will be performed.  1. PROGNOSTIC INDICATORS Results: IMMUNOHISTOCHEMICAL AND MORPHOMETRIC ANALYSIS PERFORMED MANUALLY Estrogen Receptor: 100%, POSITIVE, STRONG STAINING INTENSITY Progesterone Receptor: 100%, POSITIVE, STRONG STAINING INTENSITY Proliferation Marker Ki67: 15%  Results: HER2 - NEGATIVE RATIO OF HER2/CEP17 SIGNALS 1.52 AVERAGE HER2 COPY NUMBER PER CELL 2.35  2. PROGNOSTIC INDICATORS Results: IMMUNOHISTOCHEMICAL AND MORPHOMETRIC ANALYSIS PERFORMED MANUALLY Estrogen Receptor: 90%, POSITIVE, STRONG STAINING INTENSITY Progesterone Receptor: 90%,  Proliferation Marker Ki67:  70%  Results: HER2 - NEGATIVE RATIO OF HER2/CEP17 SIGNALS 1.09 AVERAGE HER2 COPY NUMBER PER CELL 1.90  RADIOGRAPHIC STUDIES: I have personally reviewed the radiological images as listed and agreed with the findings in the report. No results found.  ASSESSMENT & PLAN:  51 y.o. premenopausal Caucasian female, presented with a palpable right breast mass.  1. 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 -Due to the clinically rapid growing tumor, moderate size, positive lymph nodes, high-grade, high Ki-67, this is likely a aggressive tumor, the risk of recurrence after surgery is likely high. -I recommend her to consider neoadjuvant chemotherapy, to shrink the tumor, make the lumpectomy more feasible, and potentially avoid axillary lymph node dissection. -She has completed 4 cycles of Adriamycin and Cytoxan, tolerated first week Taxol well, with mild to moderate abdominal cramps and diarrhea. Reviewed, adequate for treatment, we'll continue weekly Taxol. -She has decided to have double mastectomy, despite negative genetics. she is extremely concerned about the risk of future breast cancer, I will let Dr. Donne Hazel know   2. Genetics  -Her young age and strong family history of breast cancer, we'll refer her to his genetics for testing, to rule out inheritable genetic syndrome.  3. Anxiety  and insomnia -She has been quite anxious since cancer diagnosis, but does not feel she needs medication -She has tried Ambien, do not like it  -continue lorazepam 0.5 mg as needed for insomnia and anxiety    4. Neuropathy, G1 -Chemotherapy induced. -Numbness of her fingers and  toes. -The patient has numbness of her left leg,  I advised her to not sleep on that side.  5. Hot flush and night sweats -She recently started Effexor 37.46m daily, we'll continue. If no significant improvement, I plan to increase the dose on next visit.  6. Diarrhea and abdominal cramps -Secondary to Taxol -I recommend her to use Imodium as needed for diarrhea.  Plan  -Lab reviewed, adequate for treatment, we'll continue weekly Taxol  -I'll see her back in 2 week, if she does well  No orders of the defined types were placed in this encounter.   All questions were answered. The patient knows to call the clinic with any problems, questions or concerns.  I spent 15 minutes counseling the patient face to face. The total time spent in the appointment was 20 minutes and more than 50% was on counseling.    FTruitt Merle MD 12/20/2016

## 2016-12-20 ENCOUNTER — Ambulatory Visit: Payer: 59

## 2016-12-20 ENCOUNTER — Other Ambulatory Visit (HOSPITAL_BASED_OUTPATIENT_CLINIC_OR_DEPARTMENT_OTHER): Payer: 59

## 2016-12-20 ENCOUNTER — Encounter: Payer: Self-pay | Admitting: Hematology

## 2016-12-20 ENCOUNTER — Ambulatory Visit (HOSPITAL_BASED_OUTPATIENT_CLINIC_OR_DEPARTMENT_OTHER): Payer: 59 | Admitting: Hematology

## 2016-12-20 ENCOUNTER — Ambulatory Visit (HOSPITAL_BASED_OUTPATIENT_CLINIC_OR_DEPARTMENT_OTHER): Payer: 59

## 2016-12-20 VITALS — BP 111/54 | HR 72 | Temp 98.6°F | Resp 20 | Ht 64.0 in | Wt 181.0 lb

## 2016-12-20 DIAGNOSIS — C50511 Malignant neoplasm of lower-outer quadrant of right female breast: Secondary | ICD-10-CM

## 2016-12-20 DIAGNOSIS — C773 Secondary and unspecified malignant neoplasm of axilla and upper limb lymph nodes: Secondary | ICD-10-CM

## 2016-12-20 DIAGNOSIS — Z170 Estrogen receptor positive status [ER+]: Secondary | ICD-10-CM

## 2016-12-20 DIAGNOSIS — G4700 Insomnia, unspecified: Secondary | ICD-10-CM | POA: Diagnosis not present

## 2016-12-20 DIAGNOSIS — Z5111 Encounter for antineoplastic chemotherapy: Secondary | ICD-10-CM | POA: Diagnosis not present

## 2016-12-20 DIAGNOSIS — F419 Anxiety disorder, unspecified: Secondary | ICD-10-CM

## 2016-12-20 DIAGNOSIS — Z95828 Presence of other vascular implants and grafts: Secondary | ICD-10-CM

## 2016-12-20 LAB — CBC WITH DIFFERENTIAL/PLATELET
BASO%: 0.4 % (ref 0.0–2.0)
BASOS ABS: 0 10*3/uL (ref 0.0–0.1)
EOS%: 0.8 % (ref 0.0–7.0)
Eosinophils Absolute: 0 10*3/uL (ref 0.0–0.5)
HCT: 30.9 % — ABNORMAL LOW (ref 34.8–46.6)
HGB: 10.6 g/dL — ABNORMAL LOW (ref 11.6–15.9)
LYMPH%: 22.1 % (ref 14.0–49.7)
MCH: 31.7 pg (ref 25.1–34.0)
MCHC: 34.2 g/dL (ref 31.5–36.0)
MCV: 92.7 fL (ref 79.5–101.0)
MONO#: 0.4 10*3/uL (ref 0.1–0.9)
MONO%: 8.8 % (ref 0.0–14.0)
NEUT#: 3.4 10*3/uL (ref 1.5–6.5)
NEUT%: 67.9 % (ref 38.4–76.8)
Platelets: 302 10*3/uL (ref 145–400)
RBC: 3.33 10*6/uL — ABNORMAL LOW (ref 3.70–5.45)
RDW: 15.5 % — AB (ref 11.2–14.5)
WBC: 5.1 10*3/uL (ref 3.9–10.3)
lymph#: 1.1 10*3/uL (ref 0.9–3.3)

## 2016-12-20 LAB — COMPREHENSIVE METABOLIC PANEL
ALT: 25 U/L (ref 0–55)
AST: 20 U/L (ref 5–34)
Albumin: 3.9 g/dL (ref 3.5–5.0)
Alkaline Phosphatase: 66 U/L (ref 40–150)
Anion Gap: 9 mEq/L (ref 3–11)
BUN: 14 mg/dL (ref 7.0–26.0)
CHLORIDE: 106 meq/L (ref 98–109)
CO2: 24 mEq/L (ref 22–29)
Calcium: 9.2 mg/dL (ref 8.4–10.4)
Creatinine: 0.7 mg/dL (ref 0.6–1.1)
EGFR: >90 mL/min/{1.73_m2} (ref >90)
GLUCOSE: 151 mg/dL — AB (ref 70–140)
POTASSIUM: 3.8 meq/L (ref 3.5–5.1)
SODIUM: 138 meq/L (ref 136–145)
Total Bilirubin: 0.22 mg/dL (ref 0.20–1.20)
Total Protein: 7 g/dL (ref 6.4–8.3)

## 2016-12-20 MED ORDER — DIPHENHYDRAMINE HCL 50 MG/ML IJ SOLN
25.0000 mg | Freq: Once | INTRAMUSCULAR | Status: AC
  Administered 2016-12-20: 25 mg via INTRAVENOUS

## 2016-12-20 MED ORDER — SODIUM CHLORIDE 0.9% FLUSH
10.0000 mL | INTRAVENOUS | Status: DC | PRN
  Administered 2016-12-20: 10 mL via INTRAVENOUS
  Filled 2016-12-20: qty 10

## 2016-12-20 MED ORDER — SODIUM CHLORIDE 0.9% FLUSH
10.0000 mL | INTRAVENOUS | Status: DC | PRN
  Administered 2016-12-20: 10 mL
  Filled 2016-12-20: qty 10

## 2016-12-20 MED ORDER — SODIUM CHLORIDE 0.9 % IV SOLN
Freq: Once | INTRAVENOUS | Status: AC
  Administered 2016-12-20: 12:00:00 via INTRAVENOUS

## 2016-12-20 MED ORDER — FAMOTIDINE IN NACL 20-0.9 MG/50ML-% IV SOLN
20.0000 mg | Freq: Once | INTRAVENOUS | Status: AC
  Administered 2016-12-20: 20 mg via INTRAVENOUS

## 2016-12-20 MED ORDER — HEPARIN SOD (PORK) LOCK FLUSH 100 UNIT/ML IV SOLN
500.0000 [IU] | Freq: Once | INTRAVENOUS | Status: AC | PRN
  Administered 2016-12-20: 500 [IU]
  Filled 2016-12-20: qty 5

## 2016-12-20 MED ORDER — DIPHENHYDRAMINE HCL 50 MG/ML IJ SOLN
INTRAMUSCULAR | Status: AC
  Filled 2016-12-20: qty 1

## 2016-12-20 MED ORDER — FAMOTIDINE IN NACL 20-0.9 MG/50ML-% IV SOLN
INTRAVENOUS | Status: AC
  Filled 2016-12-20: qty 50

## 2016-12-20 MED ORDER — DEXTROSE 5 % IV SOLN
80.0000 mg/m2 | Freq: Once | INTRAVENOUS | Status: AC
  Administered 2016-12-20: 150 mg via INTRAVENOUS
  Filled 2016-12-20: qty 25

## 2016-12-20 MED ORDER — DEXAMETHASONE SODIUM PHOSPHATE 10 MG/ML IJ SOLN
INTRAMUSCULAR | Status: AC
Start: 2016-12-20 — End: 2016-12-20
  Filled 2016-12-20: qty 1

## 2016-12-20 MED ORDER — DEXAMETHASONE SODIUM PHOSPHATE 10 MG/ML IJ SOLN
10.0000 mg | Freq: Once | INTRAMUSCULAR | Status: AC
  Administered 2016-12-20: 10 mg via INTRAVENOUS

## 2016-12-20 NOTE — Patient Instructions (Signed)
[facility] Discharge Instructions for Patients Receiving Chemotherapy  Today you received the following chemotherapy agents:  Taxol (paclitaxel)  To help prevent nausea and vomiting after your treatment, we encourage you to take your nausea medication as prescribed.   If you develop nausea and vomiting that is not controlled by your nausea medication, call the clinic.   BELOW ARE SYMPTOMS THAT SHOULD BE REPORTED IMMEDIATELY:  *FEVER GREATER THAN 100.5 F  *CHILLS WITH OR WITHOUT FEVER  NAUSEA AND VOMITING THAT IS NOT CONTROLLED WITH YOUR NAUSEA MEDICATION  *UNUSUAL SHORTNESS OF BREATH  *UNUSUAL BRUISING OR BLEEDING  TENDERNESS IN MOUTH AND THROAT WITH OR WITHOUT PRESENCE OF ULCERS  *URINARY PROBLEMS  *BOWEL PROBLEMS  UNUSUAL RASH Items with * indicate a potential emergency and should be followed up as soon as possible.  Feel free to call the clinic you have any questions or concerns. The clinic phone number is [phone].  Please show the [place] at check-in to the Emergency Department and triage nurse.     Paclitaxel injection What is this medicine? PACLITAXEL (PAK li TAX el) is a chemotherapy drug. It targets fast dividing cells, like cancer cells, and causes these cells to die. This medicine is used to treat ovarian cancer, breast cancer, and other cancers. This medicine may be used for other purposes; ask your health care provider or pharmacist if you have questions. COMMON BRAND NAME(S): Onxol, Taxol What should I tell my health care provider before I take this medicine? They need to know if you have any of these conditions: -blood disorders -irregular heartbeat -infection (especially a virus infection such as chickenpox, cold sores, or herpes) -liver disease -previous or ongoing radiation therapy -an unusual or allergic reaction to paclitaxel, alcohol, polyoxyethylated castor oil, other chemotherapy agents, other medicines, foods,  dyes, or preservatives -pregnant or trying to get pregnant -breast-feeding How should I use this medicine? This drug is given as an infusion into a vein. It is administered in a hospital or clinic by a specially trained health care professional. Talk to your pediatrician regarding the use of this medicine in children. Special care may be needed. Overdosage: If you think you have taken too much of this medicine contact a poison control center or emergency room at once. NOTE: This medicine is only for you. Do not share this medicine with others. What if I miss a dose? It is important not to miss your dose. Call your doctor or health care professional if you are unable to keep an appointment. What may interact with this medicine? Do not take this medicine with any of the following medications: -disulfiram -metronidazole This medicine may also interact with the following medications: -cyclosporine -diazepam -ketoconazole -medicines to increase blood counts like filgrastim, pegfilgrastim, sargramostim -other chemotherapy drugs like cisplatin, doxorubicin, epirubicin, etoposide, teniposide, vincristine -quinidine -testosterone -vaccines -verapamil Talk to your doctor or health care professional before taking any of these medicines: -acetaminophen -aspirin -ibuprofen -ketoprofen -naproxen This list may not describe all possible interactions. Give your health care provider a list of all the medicines, herbs, non-prescription drugs, or dietary supplements you use. Also tell them if you smoke, drink alcohol, or use illegal drugs. Some items may interact with your medicine. What should I watch for while using this medicine? Your condition will be monitored carefully while you are receiving this medicine. You will need important blood work done while you are taking this medicine. This medicine can cause serious allergic reactions. To reduce your risk you  will need to take other medicine(s)  before treatment with this medicine. If you experience allergic reactions like skin rash, itching or hives, swelling of the face, lips, or tongue, tell your doctor or health care professional right away. In some cases, you may be given additional medicines to help with side effects. Follow all directions for their use. This drug may make you feel generally unwell. This is not uncommon, as chemotherapy can affect healthy cells as well as cancer cells. Report any side effects. Continue your course of treatment even though you feel ill unless your doctor tells you to stop. Call your doctor or health care professional for advice if you get a fever, chills or sore throat, or other symptoms of a cold or flu. Do not treat yourself. This drug decreases your body's ability to fight infections. Try to avoid being around people who are sick. This medicine may increase your risk to bruise or bleed. Call your doctor or health care professional if you notice any unusual bleeding. Be careful brushing and flossing your teeth or using a toothpick because you may get an infection or bleed more easily. If you have any dental work done, tell your dentist you are receiving this medicine. Avoid taking products that contain aspirin, acetaminophen, ibuprofen, naproxen, or ketoprofen unless instructed by your doctor. These medicines may hide a fever. Do not become pregnant while taking this medicine. Women should inform their doctor if they wish to become pregnant or think they might be pregnant. There is a potential for serious side effects to an unborn child. Talk to your health care professional or pharmacist for more information. Do not breast-feed an infant while taking this medicine. Men are advised not to father a child while receiving this medicine. This product may contain alcohol. Ask your pharmacist or healthcare provider if this medicine contains alcohol. Be sure to tell all healthcare providers you are taking this  medicine. Certain medicines, like metronidazole and disulfiram, can cause an unpleasant reaction when taken with alcohol. The reaction includes flushing, headache, nausea, vomiting, sweating, and increased thirst. The reaction can last from 30 minutes to several hours. What side effects may I notice from receiving this medicine? Side effects that you should report to your doctor or health care professional as soon as possible: -allergic reactions like skin rash, itching or hives, swelling of the face, lips, or tongue -low blood counts - This drug may decrease the number of white blood cells, red blood cells and platelets. You may be at increased risk for infections and bleeding. -signs of infection - fever or chills, cough, sore throat, pain or difficulty passing urine -signs of decreased platelets or bleeding - bruising, pinpoint red spots on the skin, black, tarry stools, nosebleeds -signs of decreased red blood cells - unusually weak or tired, fainting spells, lightheadedness -breathing problems -chest pain -high or low blood pressure -mouth sores -nausea and vomiting -pain, swelling, redness or irritation at the injection site -pain, tingling, numbness in the hands or feet -slow or irregular heartbeat -swelling of the ankle, feet, hands Side effects that usually do not require medical attention (report to your doctor or health care professional if they continue or are bothersome): -bone pain -complete hair loss including hair on your head, underarms, pubic hair, eyebrows, and eyelashes -changes in the color of fingernails -diarrhea -loosening of the fingernails -loss of appetite -muscle or joint pain -red flush to skin -sweating This list may not describe all possible side effects. Call your doctor for   medical advice about side effects. You may report side effects to FDA at 1-800-FDA-1088. Where should I keep my medicine? This drug is given in a hospital or clinic and will not be  stored at home. NOTE: This sheet is a summary. It may not cover all possible information. If you have questions about this medicine, talk to your doctor, pharmacist, or health care provider.  2017 Elsevier/Gold Standard (2015-09-12 19:58:00)

## 2016-12-25 ENCOUNTER — Other Ambulatory Visit: Payer: Self-pay | Admitting: Hematology

## 2016-12-25 DIAGNOSIS — Z170 Estrogen receptor positive status [ER+]: Principal | ICD-10-CM

## 2016-12-25 DIAGNOSIS — C50511 Malignant neoplasm of lower-outer quadrant of right female breast: Secondary | ICD-10-CM

## 2016-12-26 ENCOUNTER — Other Ambulatory Visit: Payer: Self-pay | Admitting: *Deleted

## 2016-12-26 DIAGNOSIS — C50511 Malignant neoplasm of lower-outer quadrant of right female breast: Secondary | ICD-10-CM

## 2016-12-26 DIAGNOSIS — Z170 Estrogen receptor positive status [ER+]: Principal | ICD-10-CM

## 2016-12-26 MED ORDER — ALPRAZOLAM 0.5 MG PO TABS: 0.5000 mg | ORAL_TABLET | Freq: Every evening | ORAL | 0 refills | Status: DC | PRN

## 2016-12-26 MED ORDER — OXYCODONE-ACETAMINOPHEN 5-325 MG PO TABS: 1.0000 | ORAL_TABLET | Freq: Three times a day (TID) | ORAL | 0 refills | Status: DC | PRN

## 2016-12-27 ENCOUNTER — Encounter: Payer: Self-pay | Admitting: Hematology

## 2016-12-27 ENCOUNTER — Ambulatory Visit: Payer: 59

## 2016-12-27 ENCOUNTER — Ambulatory Visit (HOSPITAL_BASED_OUTPATIENT_CLINIC_OR_DEPARTMENT_OTHER): Payer: 59

## 2016-12-27 ENCOUNTER — Ambulatory Visit (HOSPITAL_BASED_OUTPATIENT_CLINIC_OR_DEPARTMENT_OTHER): Payer: 59 | Admitting: Hematology

## 2016-12-27 ENCOUNTER — Other Ambulatory Visit (HOSPITAL_BASED_OUTPATIENT_CLINIC_OR_DEPARTMENT_OTHER): Payer: 59

## 2016-12-27 VITALS — BP 124/41 | HR 69 | Temp 98.2°F | Resp 17 | Ht 65.0 in | Wt 174.5 lb

## 2016-12-27 DIAGNOSIS — Z5111 Encounter for antineoplastic chemotherapy: Secondary | ICD-10-CM | POA: Diagnosis not present

## 2016-12-27 DIAGNOSIS — F419 Anxiety disorder, unspecified: Secondary | ICD-10-CM

## 2016-12-27 DIAGNOSIS — Z170 Estrogen receptor positive status [ER+]: Secondary | ICD-10-CM

## 2016-12-27 DIAGNOSIS — C773 Secondary and unspecified malignant neoplasm of axilla and upper limb lymph nodes: Secondary | ICD-10-CM

## 2016-12-27 DIAGNOSIS — C50511 Malignant neoplasm of lower-outer quadrant of right female breast: Secondary | ICD-10-CM

## 2016-12-27 DIAGNOSIS — Z95828 Presence of other vascular implants and grafts: Secondary | ICD-10-CM

## 2016-12-27 DIAGNOSIS — G4700 Insomnia, unspecified: Secondary | ICD-10-CM | POA: Diagnosis not present

## 2016-12-27 LAB — CBC WITH DIFFERENTIAL/PLATELET
BASO%: 0.4 % (ref 0.0–2.0)
Basophils Absolute: 0 10*3/uL (ref 0.0–0.1)
EOS ABS: 0.1 10*3/uL (ref 0.0–0.5)
EOS%: 0.9 % (ref 0.0–7.0)
HEMATOCRIT: 32 % — AB (ref 34.8–46.6)
HEMOGLOBIN: 11.3 g/dL — AB (ref 11.6–15.9)
LYMPH#: 1.2 10*3/uL (ref 0.9–3.3)
LYMPH%: 21.6 % (ref 14.0–49.7)
MCH: 31.7 pg (ref 25.1–34.0)
MCHC: 35.3 g/dL (ref 31.5–36.0)
MCV: 89.6 fL (ref 79.5–101.0)
MONO#: 0.4 10*3/uL (ref 0.1–0.9)
MONO%: 7.8 % (ref 0.0–14.0)
NEUT%: 69.3 % (ref 38.4–76.8)
NEUTROS ABS: 3.8 10*3/uL (ref 1.5–6.5)
Platelets: 270 10*3/uL (ref 145–400)
RBC: 3.57 10*6/uL — ABNORMAL LOW (ref 3.70–5.45)
RDW: 16.3 % — AB (ref 11.2–14.5)
WBC: 5.5 10*3/uL (ref 3.9–10.3)

## 2016-12-27 LAB — COMPREHENSIVE METABOLIC PANEL
ALBUMIN: 4 g/dL (ref 3.5–5.0)
ALK PHOS: 68 U/L (ref 40–150)
ALT: 39 U/L (ref 0–55)
AST: 23 U/L (ref 5–34)
Anion Gap: 9 mEq/L (ref 3–11)
BILIRUBIN TOTAL: 0.34 mg/dL (ref 0.20–1.20)
BUN: 11.5 mg/dL (ref 7.0–26.0)
CO2: 24 mEq/L (ref 22–29)
CREATININE: 0.6 mg/dL (ref 0.6–1.1)
Calcium: 9.5 mg/dL (ref 8.4–10.4)
Chloride: 106 mEq/L (ref 98–109)
EGFR: >90 mL/min/{1.73_m2} (ref >90)
Glucose: 109 mg/dl (ref 70–140)
Potassium: 4.3 mEq/L (ref 3.5–5.1)
SODIUM: 138 meq/L (ref 136–145)
TOTAL PROTEIN: 7.4 g/dL (ref 6.4–8.3)

## 2016-12-27 MED ORDER — DEXAMETHASONE SODIUM PHOSPHATE 10 MG/ML IJ SOLN
10.0000 mg | Freq: Once | INTRAMUSCULAR | Status: AC
  Administered 2016-12-27: 10 mg via INTRAVENOUS

## 2016-12-27 MED ORDER — FAMOTIDINE IN NACL 20-0.9 MG/50ML-% IV SOLN
INTRAVENOUS | Status: AC
  Filled 2016-12-27: qty 50

## 2016-12-27 MED ORDER — PACLITAXEL CHEMO INJECTION 300 MG/50ML
80.0000 mg/m2 | Freq: Once | INTRAVENOUS | Status: AC
  Administered 2016-12-27: 150 mg via INTRAVENOUS
  Filled 2016-12-27: qty 25

## 2016-12-27 MED ORDER — SODIUM CHLORIDE 0.9 % IV SOLN: Freq: Once | INTRAVENOUS | Status: DC

## 2016-12-27 MED ORDER — HEPARIN SOD (PORK) LOCK FLUSH 100 UNIT/ML IV SOLN
500.0000 [IU] | Freq: Once | INTRAVENOUS | Status: DC | PRN
  Filled 2016-12-27: qty 5

## 2016-12-27 MED ORDER — HEPARIN SOD (PORK) LOCK FLUSH 100 UNIT/ML IV SOLN
500.0000 [IU] | Freq: Once | INTRAVENOUS | Status: AC | PRN
  Administered 2016-12-27: 500 [IU]
  Filled 2016-12-27: qty 5

## 2016-12-27 MED ORDER — FAMOTIDINE IN NACL 20-0.9 MG/50ML-% IV SOLN
20.0000 mg | Freq: Once | INTRAVENOUS | Status: AC
  Administered 2016-12-27: 20 mg via INTRAVENOUS

## 2016-12-27 MED ORDER — SODIUM CHLORIDE 0.9% FLUSH
10.0000 mL | INTRAVENOUS | Status: DC | PRN
  Administered 2016-12-27: 10 mL
  Filled 2016-12-27: qty 10

## 2016-12-27 MED ORDER — DIPHENHYDRAMINE HCL 50 MG/ML IJ SOLN
INTRAMUSCULAR | Status: AC
  Filled 2016-12-27: qty 1

## 2016-12-27 MED ORDER — DIPHENOXYLATE-ATROPINE 2.5-0.025 MG PO TABS: 1.0000 | ORAL_TABLET | Freq: Four times a day (QID) | ORAL | 2 refills | Status: DC | PRN

## 2016-12-27 MED ORDER — DIPHENHYDRAMINE HCL 50 MG/ML IJ SOLN
25.0000 mg | Freq: Once | INTRAMUSCULAR | Status: AC
  Administered 2016-12-27: 25 mg via INTRAVENOUS

## 2016-12-27 MED ORDER — SODIUM CHLORIDE 0.9 % IV SOLN
Freq: Once | INTRAVENOUS | Status: AC
  Administered 2016-12-27: 10:00:00 via INTRAVENOUS

## 2016-12-27 MED ORDER — SODIUM CHLORIDE 0.9% FLUSH
10.0000 mL | INTRAVENOUS | Status: DC | PRN
  Administered 2016-12-27: 10 mL via INTRAVENOUS
  Filled 2016-12-27: qty 10

## 2016-12-27 MED ORDER — DEXAMETHASONE SODIUM PHOSPHATE 10 MG/ML IJ SOLN
INTRAMUSCULAR | Status: AC
  Filled 2016-12-27: qty 1

## 2016-12-27 MED ORDER — ALTEPLASE 2 MG IJ SOLR
2.0000 mg | Freq: Once | INTRAMUSCULAR | Status: DC | PRN
  Filled 2016-12-27: qty 2

## 2016-12-27 NOTE — Patient Instructions (Signed)
[facility] Discharge Instructions for Patients Receiving Chemotherapy  Today you received the following chemotherapy agents:  Taxol (paclitaxel)  To help prevent nausea and vomiting after your treatment, we encourage you to take your nausea medication as prescribed.   If you develop nausea and vomiting that is not controlled by your nausea medication, call the clinic.   BELOW ARE SYMPTOMS THAT SHOULD BE REPORTED IMMEDIATELY:  *FEVER GREATER THAN 100.5 F  *CHILLS WITH OR WITHOUT FEVER  NAUSEA AND VOMITING THAT IS NOT CONTROLLED WITH YOUR NAUSEA MEDICATION  *UNUSUAL SHORTNESS OF BREATH  *UNUSUAL BRUISING OR BLEEDING  TENDERNESS IN MOUTH AND THROAT WITH OR WITHOUT PRESENCE OF ULCERS  *URINARY PROBLEMS  *BOWEL PROBLEMS  UNUSUAL RASH Items with * indicate a potential emergency and should be followed up as soon as possible.  Feel free to call the clinic you have any questions or concerns. The clinic phone number is [phone].  Please show the CHEMO ALERT CARD at check-in to the Emergency Department and triage nurse.   

## 2016-12-27 NOTE — Progress Notes (Signed)
[place]  [phone] [phone]  Clinic Follow up Note   Patient Care Team: No Pcp Per Patient as PCP - General (General Practice) April Bookbinder, MD as Consulting Physician (General Surgery) April Merle, MD as Consulting Physician (Hematology) April Rudd, MD as Consulting Physician (Radiation Oncology) 12/27/2016  CHIEF COMPLAINTS:  Follow up right breast cancer  Oncology History   Malignant neoplasm of lower-outer quadrant of right female breast [facility])   Staging form: Breast, AJCC 7th Edition   - Clinical stage from 09/12/2016: Stage IIB (T2, N1, M0) - Signed by April Merle, MD on 09/18/2016      Malignant neoplasm of lower-outer quadrant of right female breast ([place])   09/10/2016 Mammogram    Mammogram and ultrasound showed a 2.7 cm mass at 8 clock position of the right breast, there is also a lobulated lymph nodes in the right axilla, slightly suspicious for malignancy.       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  Negative genetic testing for the MSH2 inversion analysis ([place] inversion). The Breast/Ovarian gene panel offered by [facility] includes sequencing and rearrangement analysis for the following 20 genes:  ATM, BARD1, BRCA1, BRCA2, BRIP1, CDH1, CHEK2, EPCAM, FANCC, MLH1, MSH2, MSH6, NBN,  PALB2, PMS2, PTEN, RAD51C, RAD51D, TP53, and XRCC2.   The report date is December 19 for the [employer] panel and November 20, 2016 for the [place] inversion.        HISTORY OF PRESENTING ILLNESS:  April Orozco 51 y.o. female is here because of her recently diagnosed right breast cancer. She is accompanied by her boyfriend to our multidisciplinary breast clinic today.   She noticed a right breast lump 3 monthsa ago, no pain or tenderness, no skin change or nipple discharge. She feels the lump has been getting bigger over the past 3 months. She called her gynecologist and was finally seen a few weeks ago. She was referred to have a diagnostic mammogram and ultrasound which showed a 2.7 cm mass at the 8:00 position of the right breast, and also a lobulated lymph nodes in the right axilla. 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  She feels well, denies any skin pain, or other symptoms. No recent weight loss. 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 Her husband died from small cell cancer several years ago, she has 5 daughters. She recently found out that 3 of her paternal cousins had breast cancer.  GYN HISTORY  Menarchal: 17 LMP: regular  Contraceptive: no  HRT: n/a  G5P5: 5 daughters 30-19  CURRENT THERAPY: Neoadjuvant Adriamycin and Cytoxan, every 2 weeks, with  Neulasta, for total 4 cycles, followed by weekly Taxol for 12 weeks  INTERIM HISTORY: April Orozco returns for follow-up and chemo. She Has developed worsening diarrhea since last week of chemotherapy, loose bowel movement 6-8 times a day, despite she has been taking Imodium 6-8 tablets a day. Mild abdominal cramps, no nausea vomiting, she has been drinking fluids adequately, eating small meals. She still able to go to work every day. No fever or  chills.  MEDICAL HISTORY:  Past Medical History:  Diagnosis Date  . Fracture, sacrum/coccyx ([place]) 1970   "Shattered tailbone"  . Heart murmur    [facility] has evaluated, felt benign  . Malignant neoplasm of lower-outer quadrant of right female breast ([place]) 09/13/2016  . Tachycardia    Intermittent, resolved with decreased caffeine intake    SURGICAL HISTORY: Past Surgical History:  Procedure Laterality Date  . APPENDECTOMY    . PORTACATH PLACEMENT Right 09/23/2016   Procedure: INSERTION PORT-A-CATH WITH [place];  Surgeon: April Bookbinder, MD;  Location: [place];  Service: General;  Laterality: Right;  . TONSILECTOMY/ADENOIDECTOMY WITH MYRINGOTOMY    . TUBAL LIGATION      SOCIAL HISTORY: Social History   Social History  . Marital status: Widowed    Spouse name: N/A  . Number of children: 5  . Years of education: N/A   Occupational History  . RN     OR trauma nurse   Social History Main Topics  . Smoking status: Never Smoker  . Smokeless tobacco: Never Used  . Alcohol use Yes     Comment: social drinker   . Drug use: No  . Sexual activity: Not on file   Other Topics Concern  . Not on file   Social History Narrative  . No narrative on file    FAMILY HISTORY: Family History  Problem Relation Age of Onset  . Diabetes Mother   . Diabetes Father   . Skin cancer Father 68    NOS type; worked as a Theme park manager for many years  . Hypertension Other   . Breast cancer Cousin 38    paternal 1st cousin; w/ mets to bone  . Breast cancer Cousin     paternal 1st cousin dx 37-50; s/p lump  . Breast cancer Cousin     paternal 1st cousin dx 42-53 w/ mets to LN; s/p BL mastectomies  . Heart disease Maternal Aunt 58  . Colon cancer Paternal Uncle     dx 67-68  . Goiter Maternal Grandmother     d. bled to death following goiter surgery  . Heart attack Maternal Grandfather     d. early 59s  . Lung cancer Paternal Grandmother     dx 33s; d.  88y  . Diabetes Paternal Grandmother   . Heart failure Paternal Grandfather     d. 75-76  . Alzheimer's disease Paternal Grandfather   . Other Daughter 60    daughter w/ hx benign breast lump/cyst removed  . Epilepsy Maternal Aunt   . Alzheimer's disease Paternal Aunt   . Brain cancer Cousin 44    paternal 1st cousin; d. 4y; NOS type    ALLERGIES:  is allergic to banana; sulfur; and tramadol.  MEDICATIONS:  Current Outpatient Prescriptions  Medication Sig Dispense Refill  . ALPRAZolam (XANAX) 0.5 MG tablet Take 1 tablet (0.5 mg total) by mouth at bedtime as needed for anxiety. 20 tablet 0  . docusate sodium (COLACE) 50 MG capsule Take 50 mg by mouth 2 (  two) times daily.    Marland Kitchen ibuprofen (ADVIL,MOTRIN) 200 MG tablet Take 800 mg by mouth every 6 (six) hours as needed for headache, mild pain or moderate pain.    Marland Kitchen lidocaine-prilocaine (EMLA) cream Apply 1 application topically as needed. 30 g 1  . loperamide (IMODIUM) 2 MG capsule Take 2 mg by mouth as needed for diarrhea or loose stools.    Marland Kitchen LORazepam (ATIVAN) 0.5 MG tablet Take 0.5 mg by mouth every 6 (six) hours as needed for anxiety (and-or nausea/vomiting).    . magic mouthwash SOLN Take 5 mLs by mouth 4 (four) times daily as needed for mouth pain. 240 mL 0  . nystatin cream (MYCOSTATIN) Apply 1 application topically 2 (two) times daily. 30 g 0  . ondansetron (ZOFRAN) 8 MG tablet Take 1 tablet (8 mg total) by mouth 2 (two) times daily as needed for nausea or vomiting. 20 tablet 1  . oxyCODONE-acetaminophen (PERCOCET/ROXICET) 5-325 MG tablet Take 1 tablet by mouth every 8 (eight) hours as needed for severe pain. 30 tablet 0  . prochlorperazine (COMPAZINE) 10 MG tablet Take 1 tablet (10 mg total) by mouth every 6 (six) hours as needed for nausea or vomiting. 30 tablet 1  . venlafaxine XR (EFFEXOR-XR) 37.5 MG 24 hr capsule TAKE ONE CAPSULE BY MOUTH EVERY DAY WITH BREAKFAST 30 capsule 0  . zolpidem (AMBIEN) 5 MG tablet Take 1 tablet (5 mg  total) by mouth at bedtime as needed for sleep. 30 tablet 1  . diphenoxylate-atropine (LOMOTIL) 2.5-0.025 MG tablet Take 1-2 tablets by mouth 4 (four) times daily as needed for diarrhea or loose stools. 60 tablet 2   No current facility-administered medications for this visit.     REVIEW OF SYSTEMS:   Constitutional: Denies fevers, chills (+) cold sweats (+) fatigue Eyes: Denies blurriness of vision, double vision or watery eyes Ears, nose, mouth, throat, and face: Denies mucositis or sore throat Respiratory: Denies dyspnea or wheezes Cardiovascular: Denies palpitation, chest discomfort or lower extremity swelling Gastrointestinal:  Denies nausea, heartburn, (+) diarrhea  Skin: Denies abnormal skin rashes Lymphatics: Denies new lymphadenopathy or easy bruising Neurological:Denies new weaknesses (+) tingling/numbness on edges of fingertips and toes Behavioral/Psych: Mood is stable, no new changes  All other systems were reviewed with the patient and are negative.  PHYSICAL EXAMINATION: ECOG PERFORMANCE STATUS: 1  Vitals:   12/27/16 0852  BP: (!) 124/41  Pulse: 69  Resp: 17  Temp: 98.2 F (36.8 C)   Filed Weights   12/27/16 0852  Weight: 174 lb 8 oz (79.2 kg)    GENERAL:alert, no distress and comfortable SKIN: skin color, texture, turgor are normal, no rashes or significant lesions EYES: normal, conjunctiva are pink and non-injected, sclera clear OROPHARYNX:no exudate, no erythema and lips, buccal mucosa, and tongue normal  NECK: supple, thyroid normal size, non-tender, without nodularity LYMPH:  no palpable lymphadenopathy in the cervical, axillary or inguinal LUNGS: clear to auscultation and percussion with normal breathing effort HEART: regular rate & rhythm and no murmurs and no lower extremity edema ABDOMEN:abdomen soft, non-tender and normal bowel sounds Musculoskeletal:no cyanosis of digits and no clubbing  PSYCH: alert & oriented x 3 with fluent speech NEURO: no  focal motor/sensory deficits Breasts: Breast inspection showed them to be symmetrical with no nipple discharge. Palpation of the breasts and axilla revealed a 1.5cm mass at 8:00 position close to right areola, smaller than before, no other obvious mass that I could appreciate.   LABORATORY DATA:  I have reviewed  the data as listed CBC Latest Ref Rng & Units 12/27/2016 12/20/2016 12/13/2016  WBC 3.9 - 10.3 10e3/uL 5.5 5.1 9.2  Hemoglobin 11.6 - 15.9 g/dL 11.3(L) 10.6(L) 11.3(L)  Hematocrit 34.8 - 46.6 % 32.0(L) 30.9(L) 33.3(L)  Platelets 145 - 400 10e3/uL 270 302 140(L)   CMP Latest Ref Rng & Units 12/27/2016 12/20/2016 12/13/2016  Glucose 70 - 140 mg/dl 109 151(H) 111  BUN 7.0 - 26.0 mg/dL 11.5 14.0 13.1  Creatinine 0.6 - 1.1 mg/dL 0.6 0.7 0.7  Sodium 136 - 145 mEq/L 138 138 139  Potassium 3.5 - 5.1 mEq/L 4.3 3.8 3.9  Chloride 101 - 111 mmol/L - - -  CO2 22 - 29 mEq/L 24 24 24   Calcium 8.4 - 10.4 mg/dL 9.5 9.2 9.5  Total Protein 6.4 - 8.3 g/dL 7.4 7.0 7.5  Total Bilirubin 0.20 - 1.20 mg/dL 0.34 0.22 0.28  Alkaline Phos 40 - 150 U/L 68 66 84  AST 5 - 34 U/L 23 20 17   ALT 0 - 55 U/L 39 25 21     PATHOLOGY REPORTS: Diagnosis 09/12/2016 1. Lymph node, needle/core biopsy, right axilla - METASTATIC CARCINOMA WITH EXTRACAPSULAR EXTENSION. 2. Breast, right, needle core biopsy, 8 o'clock - INVASIVE DUCTAL CARCINOMA, GRADE 3.  Microscopic Comment 2. The findings are consistent with grade 3 ductal carcinoma. Breast prognostic profile will be performed.  1. PROGNOSTIC INDICATORS Results: IMMUNOHISTOCHEMICAL AND MORPHOMETRIC ANALYSIS PERFORMED MANUALLY Estrogen Receptor: 100%, POSITIVE, STRONG STAINING INTENSITY Progesterone Receptor: 100%, POSITIVE, STRONG STAINING INTENSITY Proliferation Marker Ki67: 15%  Results: HER2 - NEGATIVE RATIO OF HER2/CEP17 SIGNALS 1.52 AVERAGE HER2 COPY NUMBER PER CELL 2.35  2. PROGNOSTIC INDICATORS Results: IMMUNOHISTOCHEMICAL AND MORPHOMETRIC ANALYSIS  PERFORMED MANUALLY Estrogen Receptor: 90%, POSITIVE, STRONG STAINING INTENSITY Progesterone Receptor: 90%,  Proliferation Marker Ki67: 70%  Results: HER2 - NEGATIVE RATIO OF HER2/CEP17 SIGNALS 1.09 AVERAGE HER2 COPY NUMBER PER CELL 1.90  RADIOGRAPHIC STUDIES: I have personally reviewed the radiological images as listed and agreed with the findings in the report. No results found.  ASSESSMENT & PLAN:  51 y.o. premenopausal Caucasian female, presented with a palpable right breast mass.  1. 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 -Due to the clinically rapid growing tumor, moderate size, positive lymph nodes, high-grade, high Ki-67, this is likely a aggressive tumor, the risk of recurrence after surgery is likely high. -I recommend her to consider neoadjuvant chemotherapy, to shrink the tumor, make the lumpectomy more feasible, and potentially avoid axillary lymph node dissection. -She has completed 4 cycles of Adriamycin and Cytoxan, tolerated Taxol moderately well, with moderate abdominal cramps and diarrhea. Lab reviewed, adequate for treatment, we'll continue weekly Taxol. -She has decided to have double mastectomy, despite negative genetics. she is extremely concerned about the risk of future breast cancer, I will let Dr. Donne Hazel know   2. Genetics  -Her young age and strong family history of breast cancer, we'll refer her to his genetics for testing, to rule out inheritable genetic syndrome.  3. Anxiety  and insomnia -She has been quite anxious since cancer diagnosis, but does not feel she needs medication -She has tried Ambien, do not like it  -continue  lorazepam 0.5 mg as needed for insomnia  and anxiety    4. Neuropathy, G1 -Chemotherapy induced. -Numbness of her fingers and toes. -The patient has numbness of her left leg, I advised her to not sleep on that side.  5. Hot flush and night sweats -She recently started Effexor 37.72m daily, we'll continue. If no significant improvement, I plan to increase the dose on next visit.  6. Diarrhea and abdominal cramps -Secondary to Taxol, worse after week 2 treatment -I give her a prescription of Lomotil today, she will use 1-2 tablets every 6 hours as needed -Continue Imodium as needed for diarrhea.  Plan  -Lab reviewed, adequate for treatment, we'll continue weekly Taxol  -I give her a prescription of Lomotil for diarrhea, she will also use Imodium as needed -I'll see her back next week in the infusion room   No orders of the defined types were placed in this encounter.   All questions were answered. The patient knows to call the clinic with any problems, questions or concerns.  I spent 15 minutes counseling the patient face to face. The total time spent in the appointment was 20 minutes and more than 50% was on counseling.    FTruitt Merle MD 12/27/2016

## 2016-12-27 NOTE — Patient Instructions (Signed)

## 2017-01-01 ENCOUNTER — Telehealth: Payer: Self-pay | Admitting: Hematology

## 2017-01-01 NOTE — Telephone Encounter (Signed)
Received call to cxl 2/9 appts per pt request

## 2017-01-01 NOTE — Telephone Encounter (Signed)
Called patient and left a VM to let patient know about the 2/9 appointment

## 2017-01-02 ENCOUNTER — Ambulatory Visit (HOSPITAL_COMMUNITY): Payer: 59

## 2017-01-03 ENCOUNTER — Ambulatory Visit: Payer: 59 | Admitting: Hematology

## 2017-01-03 ENCOUNTER — Ambulatory Visit: Payer: 59

## 2017-01-03 ENCOUNTER — Other Ambulatory Visit: Payer: 59

## 2017-01-03 ENCOUNTER — Telehealth (HOSPITAL_COMMUNITY): Payer: Self-pay | Admitting: Cardiology

## 2017-01-03 NOTE — Telephone Encounter (Signed)
Patient was scheduled for Echo and New Breast Cancer patient appt with Dr. Aundra Dubin on 01/02/17.  Patient called on 01/01/17 and spoke with Jeri in Cardiovascular Ultrasound before 8 am and stated she needed to cancel both appts.  Patient left a phone number for [place] to call her back.  I cancelled both appts and have made 2 attempts to reach pt (one 01/01/17 and another on 01/02/17) with no success.  I have left patient a voice message each time to call us back to reschedule her appts but we have not received a call back.

## 2017-01-08 NOTE — Telephone Encounter (Signed)
Pt still not returning our calls, have sent message to cancer center to let them know

## 2017-01-09 ENCOUNTER — Telehealth: Payer: Self-pay | Admitting: *Deleted

## 2017-01-09 NOTE — Telephone Encounter (Signed)
Left message for pt to return call regarding appt with cardiologist & cancelled appts.

## 2017-01-10 ENCOUNTER — Other Ambulatory Visit: Payer: Self-pay | Admitting: *Deleted

## 2017-01-10 ENCOUNTER — Ambulatory Visit: Payer: 59

## 2017-01-10 ENCOUNTER — Other Ambulatory Visit (HOSPITAL_BASED_OUTPATIENT_CLINIC_OR_DEPARTMENT_OTHER): Payer: 59

## 2017-01-10 ENCOUNTER — Ambulatory Visit (HOSPITAL_COMMUNITY)
Admission: RE | Admit: 2017-01-10 | Discharge: 2017-01-10 | Disposition: A | Discharge status: Home / Self Care | Payer: 59 | Source: Ambulatory Visit | Attending: Oncology | Admitting: Oncology

## 2017-01-10 ENCOUNTER — Encounter (HOSPITAL_COMMUNITY): Payer: Self-pay | Admitting: Interventional Radiology

## 2017-01-10 DIAGNOSIS — C50919 Malignant neoplasm of unspecified site of unspecified female breast: Secondary | ICD-10-CM | POA: Diagnosis not present

## 2017-01-10 DIAGNOSIS — Z9221 Personal history of antineoplastic chemotherapy: Secondary | ICD-10-CM | POA: Diagnosis not present

## 2017-01-10 DIAGNOSIS — Z452 Encounter for adjustment and management of vascular access device: Secondary | ICD-10-CM | POA: Insufficient documentation

## 2017-01-10 DIAGNOSIS — C50511 Malignant neoplasm of lower-outer quadrant of right female breast: Secondary | ICD-10-CM

## 2017-01-10 DIAGNOSIS — Z170 Estrogen receptor positive status [ER+]: Principal | ICD-10-CM

## 2017-01-10 HISTORY — PX: IR GENERIC HISTORICAL: IMG1180011

## 2017-01-10 LAB — CBC WITH DIFFERENTIAL/PLATELET
BASO%: 0.5 % (ref 0.0–2.0)
BASOS ABS: 0 10*3/uL (ref 0.0–0.1)
EOS ABS: 0.1 10*3/uL (ref 0.0–0.5)
EOS%: 1.9 % (ref 0.0–7.0)
HEMATOCRIT: 32.6 % — AB (ref 34.8–46.6)
HEMOGLOBIN: 11.3 g/dL — AB (ref 11.6–15.9)
LYMPH#: 1 10*3/uL (ref 0.9–3.3)
LYMPH%: 15.8 % (ref 14.0–49.7)
MCH: 32.7 pg (ref 25.1–34.0)
MCHC: 34.6 g/dL (ref 31.5–36.0)
MCV: 94.6 fL (ref 79.5–101.0)
MONO#: 0.5 10*3/uL (ref 0.1–0.9)
MONO%: 7.4 % (ref 0.0–14.0)
NEUT%: 74.4 % (ref 38.4–76.8)
NEUTROS ABS: 4.6 10*3/uL (ref 1.5–6.5)
Platelets: 193 10*3/uL (ref 145–400)
RBC: 3.45 10*6/uL — ABNORMAL LOW (ref 3.70–5.45)
RDW: 17.2 % — AB (ref 11.2–14.5)
WBC: 6.2 10*3/uL (ref 3.9–10.3)

## 2017-01-10 LAB — COMPREHENSIVE METABOLIC PANEL
ALBUMIN: 3.9 g/dL (ref 3.5–5.0)
ALK PHOS: 63 U/L (ref 40–150)
ALT: 31 U/L (ref 0–55)
AST: 22 U/L (ref 5–34)
Anion Gap: 11 mEq/L (ref 3–11)
BILIRUBIN TOTAL: 0.26 mg/dL (ref 0.20–1.20)
BUN: 11.7 mg/dL (ref 7.0–26.0)
CALCIUM: 9.5 mg/dL (ref 8.4–10.4)
CO2: 25 mEq/L (ref 22–29)
Chloride: 104 mEq/L (ref 98–109)
Creatinine: 0.8 mg/dL (ref 0.6–1.1)
EGFR: 85 mL/min/{1.73_m2} — AB (ref >90)
GLUCOSE: 120 mg/dL (ref 70–140)
POTASSIUM: 3.5 meq/L (ref 3.5–5.1)
SODIUM: 141 meq/L (ref 136–145)
TOTAL PROTEIN: 7.3 g/dL (ref 6.4–8.3)

## 2017-01-10 MED ORDER — IOPAMIDOL (ISOVUE-300) INJECTION 61%
50.0000 mL | Freq: Once | INTRAVENOUS | Status: AC | PRN
  Administered 2017-01-10: 10 mL via INTRAVENOUS

## 2017-01-10 MED ORDER — HEPARIN SOD (PORK) LOCK FLUSH 100 UNIT/ML IV SOLN
INTRAVENOUS | Status: AC
  Administered 2017-01-10: 500 [IU]
  Filled 2017-01-10: qty 5

## 2017-01-10 MED ORDER — IOPAMIDOL (ISOVUE-300) INJECTION 61%
INTRAVENOUS | Status: AC
  Filled 2017-01-10: qty 50

## 2017-01-10 MED ORDER — SODIUM CHLORIDE 0.9 % IV SOLN
INTRAVENOUS | Status: DC | PRN
  Administered 2017-01-10: 11:00:00

## 2017-01-10 NOTE — Patient Instructions (Signed)
Peripheral Neuropathy Peripheral neuropathy is a type of nerve damage. It affects nerves that carry signals between the spinal cord and other parts of the body. These are called peripheral nerves. With peripheral neuropathy, one nerve or a group of nerves may be damaged. What are the causes? Many things can damage peripheral nerves. For some people with peripheral neuropathy, the cause is unknown. Some causes include:  Diabetes. This is the most common cause of peripheral neuropathy.  Injury to a nerve.  Pressure or stress on a nerve that lasts a long time.  Too little vitamin B. Alcoholism can lead to this.  Infections.  Autoimmune diseases, such as multiple sclerosis and systemic lupus erythematosus.  Inherited nerve diseases.  Some medicines, such as cancer drugs.  Toxic substances, such as lead and mercury.  Too little blood flowing to the legs.  Kidney disease.  Thyroid disease.  What are the signs or symptoms? Different people have different symptoms. The symptoms you have will depend on which of your nerves is damaged. Common symptoms include:  Loss of feeling (numbness) in the feet and hands.  Tingling in the feet and hands.  Pain that burns.  Very sensitive skin.  Weakness.  Not being able to move a part of the body (paralysis).  Muscle twitching.  Clumsiness or poor coordination.  Loss of balance.  Not being able to control your bladder.  Feeling dizzy.  Sexual problems.  How is this diagnosed? Peripheral neuropathy is a symptom, not a disease. Finding the cause of peripheral neuropathy can be hard. To figure that out, your health care provider will take a medical history and do a physical exam. A neurological exam will also be done. This involves checking things affected by your brain, spinal cord, and nerves (nervous system). For example, your health care provider will check your reflexes, how you move, and what you can feel. Other types of tests  may also be ordered, such as:  Blood tests.  A test of the fluid in your spinal cord.  Imaging tests, such as CT scans or an MRI.  Electromyography (EMG). This test checks the nerves that control muscles.  Nerve conduction velocity tests. These tests check how fast messages pass through your nerves.  Nerve biopsy. A small piece of nerve is removed. It is then checked under a microscope.  How is this treated?  Medicine is often used to treat peripheral neuropathy. Medicines may include: ? Pain-relieving medicines. Prescription or over-the-counter medicine may be suggested. ? Antiseizure medicine. This may be used for pain. ? Antidepressants. These also may help ease pain from neuropathy. ? Lidocaine. This is a numbing medicine. You might wear a patch or be given a shot. ? Mexiletine. This medicine is typically used to help control irregular heart rhythms.  Surgery. Surgery may be needed to relieve pressure on a nerve or to destroy a nerve that is causing pain.  Physical therapy to help movement.  Assistive devices to help movement. Follow these instructions at home:  Only take over-the-counter or prescription medicines as directed by your health care provider. Follow the instructions carefully for any given medicines. Do not take any other medicines without first getting approval from your health care provider.  If you have diabetes, work closely with your health care provider to keep your blood sugar under control.  If you have numbness in your feet: ? Check every day for signs of injury or infection. Watch for redness, warmth, and swelling. ? Wear padded socks and comfortable   shoes. These help protect your feet.  Do not do things that put pressure on your damaged nerve.  Do not smoke. Smoking keeps blood from getting to damaged nerves.  Avoid or limit alcohol. Too much alcohol can cause a lack of B vitamins. These vitamins are needed for healthy nerves.  Develop a good  support system. Coping with peripheral neuropathy can be stressful. Talk to a mental health specialist or join a support group if you are struggling.  Follow up with your health care provider as directed. Contact a health care provider if:  You have new signs or symptoms of peripheral neuropathy.  You are struggling emotionally from dealing with peripheral neuropathy.  You have a fever. Get help right away if:  You have an injury or infection that is not healing.  You feel very dizzy or begin vomiting.  You have chest pain.  You have trouble breathing. This information is not intended to replace advice given to you by your health care provider. Make sure you discuss any questions you have with your health care provider. Document Released: 11/01/2002 Document Revised: 04/18/2016 Document Reviewed: 07/19/2013 Elsevier Interactive Patient Education  2017 [employer]  

## 2017-01-10 NOTE — Progress Notes (Signed)
Done in Infusion Room 

## 2017-01-10 NOTE — Procedures (Signed)
Poorly functioning port  S/p RT IJ PORT INSERTION  No comp Stable Full report in PACS

## 2017-01-10 NOTE — Progress Notes (Signed)
Patient complains of worsening neuropathy in her fingers in toes since the last treatment. Patient states she is experiencing more difficulty with ADL's due to neuropathy such as buttoning her shirt. Patient states the neuropathy in her feet have increased as well. Dr. Benay Spice notified. Hold chemotherapy per Dr. Benay Spice patient and spouse notified and verbalized understanding.

## 2017-01-16 NOTE — Progress Notes (Signed)
[place]  [phone] [phone]  Clinic Follow up Note   Patient Care Team: No Pcp Per Patient as PCP - General (General Practice) Rolm Bookbinder, MD as Consulting Physician (General Surgery) Truitt Merle, MD as Consulting Physician (Hematology) Kyung Rudd, MD as Consulting Physician (Radiation Oncology) 01/17/2017  CHIEF COMPLAINTS:  Follow up right breast cancer  Oncology History   Malignant neoplasm of lower-outer quadrant of right female breast [facility])   Staging form: Breast, AJCC 7th Edition   - Clinical stage from 09/12/2016: Stage IIB (T2, N1, M0) - Signed by Truitt Merle, MD on 09/18/2016      Malignant neoplasm of lower-outer quadrant of right female breast ([place])   09/10/2016 Mammogram    Mammogram and ultrasound showed a 2.7 cm mass at 8 clock position of the right breast, there is also a lobulated lymph nodes in the right axilla, slightly suspicious for malignancy.       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  Negative genetic testing for the MSH2 inversion analysis ([place] inversion). The Breast/Ovarian gene panel offered by [facility] includes sequencing and rearrangement analysis for the following 20 genes:  ATM, BARD1, BRCA1, BRCA2, BRIP1, CDH1, CHEK2, EPCAM, FANCC, MLH1, MSH2, MSH6, NBN,  PALB2, PMS2, PTEN, RAD51C, RAD51D, TP53, and XRCC2.   The report date is December 19 for the [facility] panel and November 20, 2016 for the [place] inversion.        HISTORY OF PRESENTING ILLNESS:  April Orozco 51 y.o. female is here because of her recently diagnosed right breast cancer. She is accompanied by her boyfriend to our multidisciplinary breast clinic today.   She noticed a right breast lump 3 monthsa ago, no pain or tenderness, no skin change or nipple discharge. She feels the lump has been getting bigger over the past 3 months. She called her gynecologist and was finally seen a few weeks ago. She was referred to have a diagnostic mammogram and ultrasound which showed a 2.7 cm mass at the 8:00 position of the right breast, and also a lobulated lymph nodes in the right axilla. 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  She feels well, denies any skin pain, or other symptoms. No recent weight loss. 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 Her husband died from small cell cancer several years ago, she has 5 daughters. She recently found out that 3 of her paternal cousins had breast cancer.  GYN HISTORY  Menarchal: 17 LMP: regular  Contraceptive: no  HRT: n/a  G5P5: 5 daughters 30-19  CURRENT THERAPY: Neoadjuvant Adriamycin and Cytoxan, every 2 weeks, with  Neulasta, for total 4 cycles, followed by weekly Taxol for 12 weeks  INTERIM HISTORY: April Orozco returns for follow-up and chemo. She has been experiencing awful night sweats every 90 minutes. She stopped the Effexor since it was not helping. Her neuropathy has continued to get worse. Her feet are worse than her hands. She works as a Marine scientist and can not feel patient's veins. She is dropping objects often. The neuropathy started before the Taxol, but it has  gotten worse after the Taxol was started. She is depressed and feels as if she has no quality of life. She is very tired from lack of sleep. Her hands and feet are swollen. Her hands do not feel cold. After a hot flash, she will start experiencing chills. Since starting chemo, she hasn't had a menstrual period. Denies any other concerns.   MEDICAL HISTORY:  Past Medical History:  Diagnosis Date  . Fracture, sacrum/coccyx ([place]) 1970   "Shattered tailbone"  . Heart murmur    [facility]ovascular has evaluated, felt benign  . 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 Tachycardia    Intermittent, resolved with decreased caffeine intake    SURGICAL HISTORY: Past Surgical History:  Procedure Laterality Date  . APPENDECTOMY    . IR GENERIC HISTORICAL  01/10/2017   IR CV LINE INJECTION 01/10/2017 Greggory Keen, MD [facility]-INTERV RAD  . PORTACATH PLACEMENT Right 09/23/2016   Procedure: INSERTION PORT-A-CATH WITH [place];  Surgeon: Rolm Bookbinder, MD;  Location: [place];  Service: General;  Laterality: Right;  . TONSILECTOMY/ADENOIDECTOMY WITH MYRINGOTOMY    . TUBAL LIGATION      SOCIAL HISTORY: Social History   Social History  . Marital status: Widowed    Spouse name: N/A  . Number of children: 5  . Years of education: N/A   Occupational History  . 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 Smoking status: Never Smoker  . Smokeless tobacco: Never Used  . Alcohol use Yes     Comment: social drinker   . Drug use: No  . Sexual activity: Not on file   Other Topics Concern  . Not on file   Social History Narrative  . No narrative on file    FAMILY HISTORY: Family History  Problem Relation Age of Onset  . Diabetes Mother   . Diabetes Father   . Skin cancer Father 47    NOS type; worked as a Theme park manager for many years  . Hypertension Other   . Breast cancer Cousin 52    paternal 1st cousin; w/ mets to  bone  . Breast cancer Cousin     paternal 1st cousin dx 103-50; s/p lump  . Breast cancer Cousin     paternal 1st cousin dx 65-53 w/ mets to LN; s/p BL mastectomies  . Heart disease Maternal Aunt 58  . Colon cancer Paternal Uncle     dx 67-68  . Goiter Maternal Grandmother     d. bled to death following goiter surgery  . Heart attack Maternal Grandfather     d. early 75s  . Lung cancer Paternal Grandmother     dx 20s; d. 88y  . Diabetes Paternal Grandmother   . Heart failure Paternal Grandfather     d. 75-76  . Alzheimer's disease Paternal Grandfather   . Other Daughter 52    daughter w/ hx benign breast lump/cyst removed  . Epilepsy Maternal Aunt   .  Alzheimer's disease Paternal Aunt   . Brain cancer Cousin 61    paternal 1st cousin; d. 28y; NOS type    ALLERGIES:  is allergic to banana; sulfur; and tramadol.  MEDICATIONS:  Current Outpatient Prescriptions  Medication Sig Dispense Refill  . ALPRAZolam (XANAX) 0.5 MG tablet Take 1 tablet (0.5 mg total) by mouth at bedtime as needed for anxiety. 20 tablet 0  . diphenoxylate-atropine (LOMOTIL) 2.5-0.025 MG tablet Take 1-2 tablets by mouth 4 (four) times daily as needed for diarrhea or loose stools. 60 tablet 2  . docusate sodium (COLACE) 50 MG capsule Take 50 mg by mouth 2 (two) times daily.    Marland Kitchen gabapentin (NEURONTIN) 100 MG capsule Take 1 capsule (100 mg total) by mouth 3 (three) times daily. 90 capsule 0  . ibuprofen (ADVIL,MOTRIN) 200 MG tablet Take 800 mg by mouth every 6 (six) hours as needed for headache, mild pain or moderate pain.    Marland Kitchen lidocaine-prilocaine (EMLA) cream Apply 1 application topically as needed. 30 g 1  . loperamide (IMODIUM) 2 MG capsule Take 2 mg by mouth as needed for diarrhea or loose stools.    Marland Kitchen LORazepam (ATIVAN) 0.5 MG tablet Take 0.5 mg by mouth every 6 (six) hours as needed for anxiety (and-or nausea/vomiting).    . magic mouthwash SOLN Take 5 mLs by mouth 4 (four) times daily as needed for mouth  pain. 240 mL 0  . nystatin cream (MYCOSTATIN) Apply 1 application topically 2 (two) times daily. 30 g 0  . ondansetron (ZOFRAN) 8 MG tablet Take 1 tablet (8 mg total) by mouth 2 (two) times daily as needed for nausea or vomiting. 20 tablet 1  . oxyCODONE-acetaminophen (PERCOCET/ROXICET) 5-325 MG tablet Take 1 tablet by mouth every 8 (eight) hours as needed for severe pain. 30 tablet 0  . prochlorperazine (COMPAZINE) 10 MG tablet Take 1 tablet (10 mg total) by mouth every 6 (six) hours as needed for nausea or vomiting. 30 tablet 1  . venlafaxine XR (EFFEXOR-XR) 37.5 MG 24 hr capsule TAKE ONE CAPSULE BY MOUTH EVERY DAY WITH BREAKFAST 30 capsule 0  . zolpidem (AMBIEN) 5 MG tablet Take 1 tablet (5 mg total) by mouth at bedtime as needed for sleep. 30 tablet 1   Current Facility-Administered Medications  Medication Dose Route Frequency Provider Last Rate Last Dose  . 0.9 %  sodium chloride infusion   Intravenous Once Truitt Merle, MD      . alteplase (CATHFLO ACTIVASE) injection 2 mg  2 mg Intracatheter Once PRN Truitt Merle, MD      . heparin lock flush 100 unit/mL  500 Units Intracatheter Once PRN Truitt Merle, MD      . ondansetron [facility]) injection 8 mg  8 mg Intravenous Once Truitt Merle, MD      . promethazine (PHENERGAN) injection 25 mg  25 mg Intravenous Once Truitt Merle, MD      . sodium chloride flush (NS) 0.9 % injection 10 mL  10 mL Intravenous PRN Truitt Merle, MD   10 mL at 01/17/17 1423  . sodium chloride flush (NS) 0.9 % injection 10 mL  10 mL Intracatheter PRN Truitt Merle, MD        REVIEW OF SYSTEMS:   Constitutional: Denies fevers, chills (+) night sweats, fatigue, chills Eyes: Denies blurriness of vision, double vision or watery eyes Ears, nose, mouth, throat, and face: Denies mucositis or sore throat Respiratory: Denies dyspnea or wheezes Cardiovascular: Denies palpitation, chest discomfort or lower extremity swelling  Gastrointestinal:  Denies nausea, heartburn,  Skin: Denies abnormal skin  rashes Lymphatics: Denies new lymphadenopathy or easy bruising (+) swelling in hands and feet Neurological:Denies new weaknesses (+) tingling and numbness in hands and feet Behavioral/Psych: Mood is stable, no new changes (+) depression All other systems were reviewed with the patient and are negative.  PHYSICAL EXAMINATION: ECOG PERFORMANCE STATUS: 1  Vitals:   01/17/17 1346  BP: (!) 116/48  Pulse: 79  Resp: 18  Temp: 98.8 F (37.1 C)   Filed Weights   01/17/17 1346  Weight: 181 lb 6.4 oz (82.3 kg)   GENERAL:alert, no distress and comfortable (+) alopecia  SKIN: skin color, texture, turgor are normal, no rashes or significant lesions EYES: normal, conjunctiva are pink and non-injected, sclera clear OROPHARYNX:no exudate, no erythema and lips, buccal mucosa, and tongue normal  NECK: supple, thyroid normal size, non-tender, without nodularity LYMPH:  no palpable lymphadenopathy in the cervical, axillary or inguinal LUNGS: clear to auscultation and percussion with normal breathing effort HEART: regular rate & rhythm and no murmurs and no lower extremity edema ABDOMEN:abdomen soft, non-tender and normal bowel sounds Musculoskeletal:no cyanosis of digits and no clubbing  PSYCH: alert & oriented x 3 with fluent speech NEURO: no focal motor/sensory deficits Breasts: Breast inspection showed them to be symmetrical with no nipple discharge. Palpation of the breasts and axilla revealed a 1.5cm mass at 8:00 position close to right areola, smaller than before, no other obvious mass that I could appreciate.   LABORATORY DATA:  I have reviewed the data as listed CBC Latest Ref Rng & Units 01/17/2017 01/10/2017 12/27/2016  WBC 3.9 - 10.3 10e3/uL 6.3 6.2 5.5  Hemoglobin 11.6 - 15.9 g/dL 11.9 11.3(L) 11.3(L)  Hematocrit 34.8 - 46.6 % 35.0 32.6(L) 32.0(L)  Platelets 145 - 400 10e3/uL 252 193 270   CMP Latest Ref Rng & Units 01/17/2017 01/10/2017 12/27/2016  Glucose 70 - 140 mg/dl 111 120 109   BUN 7.0 - 26.0 mg/dL 15.0 11.7 11.5  Creatinine 0.6 - 1.1 mg/dL 0.7 0.8 0.6  Sodium 136 - 145 mEq/L 139 141 138  Potassium 3.5 - 5.1 mEq/L 3.8 3.5 4.3  Chloride 101 - 111 mmol/L - - -  CO2 22 - 29 mEq/L _0 Calcium 8.4 - 10.4 mg/dL 9.7 9.5 9.5  Total Protein 6.4 - 8.3 g/dL 7.6 7.3 7.4  Total Bilirubin 0.20 - 1.20 mg/dL 0.28 0.26 0.34  Alkaline Phos 40 - 150 U/L 68 63 68  AST 5 - 34 U/L _1 ALT 0 - 55 U/L 29 31 39     PATHOLOGY REPORTS: Diagnosis 09/12/2016 1. Lymph node, needle/core biopsy, right axilla - METASTATIC CARCINOMA WITH EXTRACAPSULAR EXTENSION. 2. Breast, right, needle core biopsy, 8 o'clock - INVASIVE DUCTAL CARCINOMA, GRADE 3.  Microscopic Comment 2. The findings are consistent with grade 3 ductal carcinoma. Breast prognostic profile will be performed.  1. PROGNOSTIC INDICATORS Results: IMMUNOHISTOCHEMICAL AND MORPHOMETRIC ANALYSIS PERFORMED MANUALLY Estrogen Receptor: 100%, POSITIVE, STRONG STAINING INTENSITY Progesterone Receptor: 100%, POSITIVE, STRONG STAINING INTENSITY Proliferation Marker Ki67: 15%  Results: HER2 - NEGATIVE RATIO OF HER2/CEP17 SIGNALS 1.52 AVERAGE HER2 COPY NUMBER PER CELL 2.35  2. PROGNOSTIC INDICATORS Results: IMMUNOHISTOCHEMICAL AND MORPHOMETRIC ANALYSIS PERFORMED MANUALLY Estrogen Receptor: 90%, POSITIVE, STRONG STAINING INTENSITY Progesterone Receptor: 90%,  Proliferation Marker Ki67: 70%  Results: HER2 - NEGATIVE RATIO OF HER2/CEP17 SIGNALS 1.09 AVERAGE HER2 COPY NUMBER PER CELL 1.90  RADIOGRAPHIC STUDIES: I have personally reviewed the radiological images as listed and  agreed with the findings in the report. Ir Cv Line Injection  Result Date: 01/10/2017 INDICATION: Breast cancer, receiving chemotherapy, poorly functioning right IJ Port-A-Cath EXAM: RIGHT IJ PORT CATHETER INJECTION Date:  2/16/20182/16/2018 11:19 am Radiologist:  M. Daryll Brod, MD Guidance:  Fluoroscopic FLUOROSCOPY TIME:  6 seconds (0.1  mGy). MEDICATIONS: None. ANESTHESIA/SEDATION: None. CONTRAST:  70m ISOVUE-300 IOPAMIDOL (ISOVUE-300) INJECTION 634%COMPLICATIONS: None immediate. PROCEDURE: Informed consent was obtained from the patient following explanation of the procedure, risks, benefits and alternatives. The patient understands, agrees and consents for the procedure. All questions were addressed. A time out was performed. Under sterile conditions, the existing accessed right IJ port catheter was injected with contrast. Subtraction imaging performed. FINDINGS: Port catheter tip is in the SVC proximally, just distal to the innominate junction. Trace amount of fibrin sheath at the catheter tip. SVC is patent. Port catheter tubing is intact. Acute angulation noted of the port catheter course in the right neck. IMPRESSION: Small amount of fiber sheath at the catheter tip. Tip position in the proximal SVC. No discontinuity or extravasation. SVC remains patent. Electronically Signed   By: MJerilynn Mages  Shick M.D.   On: 01/10/2017 11:25   CT Angio Chest 10/05/2016 IMPRESSION: No evidence of significant pulmonary embolus. No evidence of active pulmonary disease. No evidence of metastasis in the chest.  ASSESSMENT & PLAN:  51y.o. premenopausal Caucasian female, presented with a palpable right breast mass.  1. 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 -Due to the clinically rapid growing tumor, moderate size, positive lymph nodes, high-grade, high Ki-67, this is likely a aggressive tumor, the risk of recurrence after surgery is likely high. -I recommended her to consider neoadjuvant chemotherapy, to shrink the tumor, make  the lumpectomy more feasible, and potentially avoid axillary lymph node dissection. 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 she is extremely concerned about the risk of future breast cancer, I will let Dr. WDonne Hazelknow  -Due to her worsening neuropathy after 3 cycles, I do not believe she can tolerate 12 cycles of treatment with Taxol. I discussed alternative treatment options with her, with lower dose Taxol, or switching to docetaxel every 3 weeks, for additional 2-3 cycles. -After lengthy discussion, patient agreed to try docetaxel, will start next week. Hold Taxol today. -Refer to plastic surgeon for reconstruction after double mastectomy.   2. Genetics  -Her young age and strong family history of breast cancer, we'll refer her to his genetics for testing, to rule out inheritable genetic syndrome.  3. Anxiety  and insomnia -She has been quite anxious since cancer diagnosis, but does not feel she needs medication -She has tried Ambien, do not like it  -continue lorazepam 0.5 mg as needed for insomnia and anxiety    4. Neuropathy, G2 -Chemotherapy induced. -Numbness and tingling of her fingers and toes. -Prescribed Neurontin, I recommend her to start at a low dose 100 mg at night, and gradually titrate the dose  5. Hot flush and night sweats -She has tried Effexor for a few months, and not feel helpful, she has stop it.  6. Diarrhea and abdominal cramps -Secondary to Taxol, worse after week 2 treatment -She will continue using Imodium and Lomotil as needed, improved lately.  7. Port issue  -she has had a difficulty on blood drawn from her port daily, -Dye study last week showed acute angulation of support  the neck, I'll send a message to Dr. Donne Hazel to see if he has any suggestions.   Plan  -Hold treatment today.  -Prescribed Neurontin 100 mg at bed time, and gradually increase dose, also increase to 3 times a day PRN -refer to plastic surgeon Dr. Veleta Miners  for her double mastectomy  -Return in 1 week to start Docetaxil, first cycle with dose 20% reduction. Planning for total of 2-3 cycles if her neuropathy is stable.    All questions were answered. The patient knows to call the clinic with any problems, questions or concerns.  I spent 25 minutes counseling the patient face to face. The total time spent in the appointment was 30 minutes and more than 50% was on counseling.   This document serves as a record of services personally performed by Truitt Merle, MD. It was created on her behalf by Martinique Casey, a trained medical scribe. The creation of this record is based on the scribe's personal observations and the provider's statements to them. This document has been checked and approved by the attending provider.  I have reviewed the above documentation for accuracy and completeness and I agree with the above.   Truitt Merle, MD 01/17/2017

## 2017-01-17 ENCOUNTER — Ambulatory Visit: Payer: 59

## 2017-01-17 ENCOUNTER — Encounter: Payer: Self-pay | Admitting: *Deleted

## 2017-01-17 ENCOUNTER — Encounter: Payer: Self-pay | Admitting: Hematology

## 2017-01-17 ENCOUNTER — Telehealth: Payer: Self-pay | Admitting: Hematology

## 2017-01-17 ENCOUNTER — Ambulatory Visit (HOSPITAL_BASED_OUTPATIENT_CLINIC_OR_DEPARTMENT_OTHER): Payer: 59 | Admitting: Hematology

## 2017-01-17 ENCOUNTER — Ambulatory Visit (HOSPITAL_BASED_OUTPATIENT_CLINIC_OR_DEPARTMENT_OTHER): Payer: 59

## 2017-01-17 ENCOUNTER — Telehealth: Payer: Self-pay | Admitting: *Deleted

## 2017-01-17 VITALS — BP 116/48 | HR 79 | Temp 98.8°F | Resp 18 | Ht 60.5 in | Wt 181.4 lb

## 2017-01-17 DIAGNOSIS — C50511 Malignant neoplasm of lower-outer quadrant of right female breast: Secondary | ICD-10-CM

## 2017-01-17 DIAGNOSIS — Z452 Encounter for adjustment and management of vascular access device: Secondary | ICD-10-CM | POA: Diagnosis not present

## 2017-01-17 DIAGNOSIS — Z170 Estrogen receptor positive status [ER+]: Secondary | ICD-10-CM

## 2017-01-17 DIAGNOSIS — Z803 Family history of malignant neoplasm of breast: Secondary | ICD-10-CM

## 2017-01-17 DIAGNOSIS — Z95828 Presence of other vascular implants and grafts: Secondary | ICD-10-CM

## 2017-01-17 DIAGNOSIS — G4700 Insomnia, unspecified: Secondary | ICD-10-CM

## 2017-01-17 DIAGNOSIS — F419 Anxiety disorder, unspecified: Secondary | ICD-10-CM

## 2017-01-17 DIAGNOSIS — G620 Drug-induced polyneuropathy: Secondary | ICD-10-CM

## 2017-01-17 DIAGNOSIS — K521 Toxic gastroenteritis and colitis: Secondary | ICD-10-CM

## 2017-01-17 DIAGNOSIS — N951 Menopausal and female climacteric states: Secondary | ICD-10-CM | POA: Diagnosis not present

## 2017-01-17 LAB — COMPREHENSIVE METABOLIC PANEL
ALT: 29 U/L (ref 0–55)
ANION GAP: 10 meq/L (ref 3–11)
AST: 22 U/L (ref 5–34)
Albumin: 4.2 g/dL (ref 3.5–5.0)
Alkaline Phosphatase: 68 U/L (ref 40–150)
BUN: 15 mg/dL (ref 7.0–26.0)
CALCIUM: 9.7 mg/dL (ref 8.4–10.4)
CHLORIDE: 104 meq/L (ref 98–109)
CO2: 26 mEq/L (ref 22–29)
CREATININE: 0.7 mg/dL (ref 0.6–1.1)
Glucose: 111 mg/dl (ref 70–140)
POTASSIUM: 3.8 meq/L (ref 3.5–5.1)
Sodium: 139 mEq/L (ref 136–145)
Total Bilirubin: 0.28 mg/dL (ref 0.20–1.20)
Total Protein: 7.6 g/dL (ref 6.4–8.3)

## 2017-01-17 LAB — CBC WITH DIFFERENTIAL/PLATELET
BASO%: 0.3 % (ref 0.0–2.0)
BASOS ABS: 0 10*3/uL (ref 0.0–0.1)
EOS%: 2.4 % (ref 0.0–7.0)
Eosinophils Absolute: 0.1 10*3/uL (ref 0.0–0.5)
HEMATOCRIT: 35 % (ref 34.8–46.6)
HGB: 11.9 g/dL (ref 11.6–15.9)
LYMPH#: 1.2 10*3/uL (ref 0.9–3.3)
LYMPH%: 18.7 % (ref 14.0–49.7)
MCH: 32.2 pg (ref 25.1–34.0)
MCHC: 33.9 g/dL (ref 31.5–36.0)
MCV: 95.1 fL (ref 79.5–101.0)
MONO#: 0.6 10*3/uL (ref 0.1–0.9)
MONO%: 8.9 % (ref 0.0–14.0)
NEUT#: 4.4 10*3/uL (ref 1.5–6.5)
NEUT%: 69.7 % (ref 38.4–76.8)
PLATELETS: 252 10*3/uL (ref 145–400)
RBC: 3.68 10*6/uL — ABNORMAL LOW (ref 3.70–5.45)
RDW: 16.8 % — ABNORMAL HIGH (ref 11.2–14.5)
WBC: 6.3 10*3/uL (ref 3.9–10.3)

## 2017-01-17 MED ORDER — SODIUM CHLORIDE 0.9 % IV SOLN: Freq: Once | INTRAVENOUS | Status: DC

## 2017-01-17 MED ORDER — SODIUM CHLORIDE 0.9% FLUSH
10.0000 mL | INTRAVENOUS | Status: DC | PRN
Start: 2017-01-17 — End: 2017-01-17
  Filled 2017-01-17: qty 10

## 2017-01-17 MED ORDER — ALTEPLASE 2 MG IJ SOLR
2.0000 mg | Freq: Once | INTRAMUSCULAR | Status: DC | PRN
  Filled 2017-01-17: qty 2

## 2017-01-17 MED ORDER — GABAPENTIN 100 MG PO CAPS: 100.0000 mg | ORAL_CAPSULE | Freq: Three times a day (TID) | ORAL | 0 refills | Status: DC

## 2017-01-17 MED ORDER — SODIUM CHLORIDE 0.9% FLUSH
10.0000 mL | INTRAVENOUS | Status: DC | PRN
  Administered 2017-01-17: 10 mL via INTRAVENOUS
  Filled 2017-01-17: qty 10

## 2017-01-17 MED ORDER — ONDANSETRON HCL 4 MG/2ML IJ SOLN: 8.0000 mg | Freq: Once | INTRAMUSCULAR | Status: DC

## 2017-01-17 MED ORDER — HEPARIN SOD (PORK) LOCK FLUSH 100 UNIT/ML IV SOLN
500.0000 [IU] | Freq: Once | INTRAVENOUS | Status: AC | PRN
  Administered 2017-01-17: 500 [IU] via INTRAVENOUS
  Filled 2017-01-17: qty 5

## 2017-01-17 MED ORDER — PROMETHAZINE HCL 25 MG/ML IJ SOLN
25.0000 mg | Freq: Once | INTRAMUSCULAR | Status: DC
  Filled 2017-01-17: qty 1

## 2017-01-17 MED ORDER — HEPARIN SOD (PORK) LOCK FLUSH 100 UNIT/ML IV SOLN
500.0000 [IU] | Freq: Once | INTRAVENOUS | Status: DC | PRN
  Filled 2017-01-17: qty 5

## 2017-01-17 NOTE — Telephone Encounter (Signed)
Appointments scheduled per 01/17/17 los. Follow up appointment with Dr Burr Medico added to 01/24/17 appointments. Patient was given a copy of the AVS report and appointment schedule per 01/17/17 los.

## 2017-01-17 NOTE — Telephone Encounter (Signed)
Discussed port issue with Dr Burr Medico on this pt.  Pt has to stand & turn head to left in order to get blood return.  Port study showed angulation of port.  Explained to

## 2017-01-23 DIAGNOSIS — N951 Menopausal and female climacteric states: Secondary | ICD-10-CM | POA: Insufficient documentation

## 2017-01-23 NOTE — Progress Notes (Signed)
[place]  [phone] [phone]  Clinic Follow up Note   Patient Care Team: No Pcp Per Patient as PCP - General (General Practice) Rolm Bookbinder, MD as Consulting Physician (General Surgery) Truitt Merle, MD as Consulting Physician (Hematology) Kyung Rudd, MD as Consulting Physician (Radiation Oncology) 01/24/2017  CHIEF COMPLAINTS:  Follow up right breast cancer  Oncology History   Malignant neoplasm of lower-outer quadrant of right female breast [facility])   Staging form: Breast, AJCC 7th Edition   - Clinical stage from 09/12/2016: Stage IIB (T2, N1, M0) - Signed by Truitt Merle, MD on 09/18/2016      Malignant neoplasm of lower-outer quadrant of right female breast ([place])   09/10/2016 Mammogram    Mammogram and ultrasound showed a 2.7 cm mass at 8 clock position of the right breast, there is also a lobulated lymph nodes in the right axilla, slightly suspicious for malignancy.       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  Negative genetic testing for the MSH2 inversion analysis ([place] inversion). The Breast/Ovarian gene panel offered by [facility] includes sequencing and rearrangement analysis for the following 20 genes:  ATM, BARD1, BRCA1, BRCA2, BRIP1, CDH1, CHEK2, EPCAM, FANCC, MLH1, MSH2, MSH6, NBN,  PALB2, PMS2, PTEN, RAD51C, RAD51D, TP53, and XRCC2.   The report date is December 19 for the [facility] panel and November 20, 2016 for the [place] inversion.        HISTORY OF PRESENTING ILLNESS:  April Orozco 51 y.o. female is here because of her recently diagnosed right breast cancer. She is accompanied by her boyfriend to our multidisciplinary breast clinic today.   She noticed a right breast lump 3 monthsa ago, no pain or tenderness, no skin change or nipple discharge. She feels the lump has been getting bigger over the past 3 months. She called her gynecologist and was finally seen a few weeks ago. She was referred to have a diagnostic mammogram and ultrasound which showed a 2.7 cm mass at the 8:00 position of the right breast, and also a lobulated lymph nodes in the right axilla. 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  She feels well, denies any skin pain, or other symptoms. No recent weight loss. 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 Her husband died from small cell cancer several years ago, she has 5 daughters. She recently found out that 3 of her paternal cousins had breast cancer.  GYN HISTORY  Menarchal: 17 LMP: regular  Contraceptive: no  HRT: n/a  G5P5: 5 daughters 30-19  CURRENT THERAPY: Neoadjuvant Adriamycin and Cytoxan, every 2 weeks, with  Neulasta, for total 4 cycles, followed by weekly Taxol for 12 weeks. 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 Docetaxel 35m/m2 starting 01/24/17, every 3 weeks.  INTERIM HISTORY: SMah[facility] for follow-up and 1st cycle of Docetaxel. The patient feels somewhat better from her last cycle of chemo (with break). She complains of neuropathy in her hands and feet. She reports taking 200 mg of Gabapentin one day and felt that her hands  were "on fire." This resolved when she started taking 100 mg at bedtime again. The patient has diarrhea for which she takes Imodium as needed. She feels like she is retaining fluid in her hands and feet. She has fatigue.  MEDICAL HISTORY:  Past Medical History:  Diagnosis Date  . Fracture, sacrum/coccyx ([place] 1970   "Shattered tailbone"  . Heart murmur    [facility] has evaluated, felt benign  . [place]  . Tachycardia    Intermittent, resolved with decreased caffeine intake    SURGICAL HISTORY: Past Surgical History:  Procedure Laterality Date  . APPENDECTOMY    . IR GENERIC HISTORICAL  01/10/2017   IR CV LINE INJECTION 01/10/2017 MGreggory Keen MD [facility]-INTERV RAD  . PORTACATH PLACEMENT Right 09/23/2016   Procedure: INSERTION PORT-A-CATH WITH [place]  Surgeon: MRolm Bookbinder MD;  [facility]  Service: General;  Laterality: Right;  . TONSILECTOMY/ADENOIDECTOMY WITH MYRINGOTOMY    . TUBAL LIGATION      SOCIAL HISTORY: Social History   Social History  . Marital status: Widowed    Spouse name: N/A  . Number of children: 5  . Years of education: N/A   Occupational History  . 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 Smoking status: Never Smoker  . Smokeless tobacco: Never Used  . Alcohol use Yes     Comment: social drinker   . Drug use: No  . Sexual activity: Not on file   Other Topics Concern  . Not on file   Social History Narrative  . No narrative on file    FAMILY HISTORY: Family History  Problem Relation Age of Onset  . Diabetes Mother   . Diabetes Father   . Skin cancer Father 744   NOS type; worked as a rTheme park managerfor many years  . Hypertension Other   . Breast cancer Cousin 410   paternal 1st cousin; w/ mets to bone  . Breast cancer Cousin     paternal 1st cousin dx 447-50 s/p lump  . Breast cancer Cousin     paternal 1st  cousin dx 574-53w/ mets to LN; s/p BL mastectomies  . Heart disease Maternal Aunt 58  . Colon cancer Paternal Uncle     dx 67-68  . Goiter Maternal Grandmother     d. bled to death following goiter surgery  . Heart attack Maternal Grandfather     d. early 720s . Lung cancer Paternal Grandmother     dx 833s d. 88y  . Diabetes Paternal Grandmother   . Heart failure Paternal Grandfather     d. 75-76  . Alzheimer's disease Paternal Grandfather   . Other Daughter 17   daughter w/ hx benign breast lump/cyst removed  . Epilepsy Maternal Aunt   . Alzheimer's disease Paternal Aunt   . Brain cancer Cousin 327   paternal 1st cousin; d. 335y NOS type  ALLERGIES:  is allergic to banana; sulfur; and tramadol.  MEDICATIONS:  Current Outpatient Prescriptions  Medication Sig Dispense Refill  . ALPRAZolam (XANAX) 0.5 MG tablet Take 1 tablet (0.5 mg total) by mouth at bedtime as needed for anxiety. 20 tablet 0  . diphenoxylate-atropine (LOMOTIL) 2.5-0.025 MG tablet Take 1-2 tablets by mouth 4 (four) times daily as needed for diarrhea or loose stools. 60 tablet 2  . docusate sodium (COLACE) 50 MG capsule Take 50 mg by mouth 2 (two) times daily.    Marland Kitchen gabapentin (NEURONTIN) 100 MG capsule Take 1 capsule (100 mg total) by mouth 3 (three) times daily. (Patient taking differently: Take 100 mg by mouth 3 (three) times daily. Takes 100 mg at bedtime currently 01/24/17 due to drowsiness) 90 capsule 0  . ibuprofen (ADVIL,MOTRIN) 200 MG tablet Take 800 mg by mouth every 6 (six) hours as needed for headache, mild pain or moderate pain.    Marland Kitchen lidocaine-prilocaine (EMLA) cream Apply 1 application topically as needed. 30 g 1  . loperamide (IMODIUM) 2 MG capsule Take 2 mg by mouth as needed for diarrhea or loose stools.    Marland Kitchen LORazepam (ATIVAN) 0.5 MG tablet Take 0.5 mg by mouth every 6 (six) hours as needed for anxiety (and-or nausea/vomiting).    . magic mouthwash SOLN Take 5 mLs by mouth 4 (four) times daily as  needed for mouth pain. 240 mL 0  . nystatin cream (MYCOSTATIN) Apply 1 application topically 2 (two) times daily. 30 g 0  . ondansetron (ZOFRAN) 8 MG tablet Take 1 tablet (8 mg total) by mouth 2 (two) times daily as needed for nausea or vomiting. 20 tablet 1  . oxyCODONE-acetaminophen (PERCOCET/ROXICET) 5-325 MG tablet Take 1 tablet by mouth every 8 (eight) hours as needed for severe pain. 30 tablet 0  . prochlorperazine (COMPAZINE) 10 MG tablet Take 1 tablet (10 mg total) by mouth every 6 (six) hours as needed for nausea or vomiting. 30 tablet 1  . zolpidem (AMBIEN) 5 MG tablet Take 1 tablet (5 mg total) by mouth at bedtime as needed for sleep. 30 tablet 1   No current facility-administered medications for this visit.    Facility-Administered Medications Ordered in Other Visits  Medication Dose Route Frequency Provider Last Rate Last Dose  . 0.9 %  sodium chloride infusion   Intravenous Once Truitt Merle, MD      . dexamethasone (DECADRON) injection 10 mg  10 mg Intravenous Once Truitt Merle, MD      . DOCEtaxel (TAXOTERE) 110 mg in dextrose 5 % 250 mL chemo infusion  60 mg/m2 (Treatment Plan Recorded) Intravenous Once Truitt Merle, MD      . heparin lock flush 100 unit/mL  500 Units Intracatheter Once PRN Truitt Merle, MD      . sodium chloride flush (NS) 0.9 % injection 10 mL  10 mL Intracatheter PRN Truitt Merle, MD        REVIEW OF SYSTEMS: Constitutional: Denies fevers, chills (+) night sweats, fatigue, chills Eyes: Denies blurriness of vision, double vision or watery eyes Ears, nose, mouth, throat, and face: Denies mucositis or sore throat Respiratory: Denies dyspnea or wheezes Cardiovascular: Denies palpitation, chest discomfort or lower extremity swelling Gastrointestinal:  Denies nausea, heartburn,  Skin: Denies abnormal skin rashes Lymphatics: Denies new lymphadenopathy or easy bruising (+) swelling in hands and feet Neurological:Denies new weaknesses (+) tingling and numbness in hands and  feet Behavioral/Psych: Mood is stable, no new changes (+) depression All other systems were reviewed  with the patient and are negative.  PHYSICAL EXAMINATION: ECOG PERFORMANCE STATUS: 1  Vitals:   01/24/17 0947  BP: (!) 116/56  Pulse: 70  Resp: 18  Temp: 98.8 F (37.1 C)   Filed Weights   01/24/17 0947  Weight: 182 lb 14.4 oz (83 kg)   GENERAL:alert, no distress and comfortable (+) alopecia  SKIN: skin color, texture, turgor are normal, no rashes or significant lesions EYES: normal, conjunctiva are pink and non-injected, sclera clear OROPHARYNX:no exudate, no erythema and lips, buccal mucosa, and tongue normal  NECK: supple, thyroid normal size, non-tender, without nodularity LYMPH:  no palpable lymphadenopathy in the cervical, axillary or inguinal LUNGS: clear to auscultation and percussion with normal breathing effort HEART: regular rate & rhythm and no murmurs and no lower extremity edema ABDOMEN:abdomen soft, non-tender and normal bowel sounds Musculoskeletal:no cyanosis of digits and no clubbing  PSYCH: alert & oriented x 3 with fluent speech NEURO: no focal motor/sensory deficits Breasts: Breast inspection showed them to be symmetrical with no nipple discharge. Palpation of the breasts and axilla revealed a 1 cm mass at 8:00 position close to right areola, smaller than before, moveable, no other obvious mass that I could appreciate.   LABORATORY DATA:  I have reviewed the data as listed CBC Latest Ref Rng & Units 01/24/2017 01/17/2017 01/10/2017  WBC 3.9 - 10.3 10e3/uL 4.8 6.3 6.2  Hemoglobin 11.6 - 15.9 g/dL 11.9 11.9 11.3(L)  Hematocrit 34.8 - 46.6 % 35.1 35.0 32.6(L)  Platelets 145 - 400 10e3/uL 236 252 193   CMP Latest Ref Rng & Units 01/24/2017 01/17/2017 01/10/2017  Glucose 70 - 140 mg/dl 104 111 120  BUN 7.0 - 26.0 mg/dL 11.0 15.0 11.7  Creatinine 0.6 - 1.1 mg/dL 0.7 0.7 0.8  Sodium 136 - 145 mEq/L 140 139 141  Potassium 3.5 - 5.1 mEq/L 4.4 3.8 3.5  Chloride 101 -  111 mmol/L - - -  CO2 22 - 29 mEq/L _0 Calcium 8.4 - 10.4 mg/dL 9.6 9.7 9.5  Total Protein 6.4 - 8.3 g/dL 7.5 7.6 7.3  Total Bilirubin 0.20 - 1.20 mg/dL 0.28 0.28 0.26  Alkaline Phos 40 - 150 U/L 72 68 63  AST 5 - 34 U/L _1 ALT 0 - 55 U/L _2 PATHOLOGY REPORTS: Diagnosis 09/12/2016 1. Lymph node, needle/core biopsy, right axilla - METASTATIC CARCINOMA WITH EXTRACAPSULAR EXTENSION. 2. Breast, right, needle core biopsy, 8 o'clock - INVASIVE DUCTAL CARCINOMA, GRADE 3.   RADIOGRAPHIC STUDIES: I have personally reviewed the radiological images as listed and agreed with the findings in the report. Ir Cv Line Injection  Result Date: 01/10/2017 INDICATION: Breast cancer, receiving chemotherapy, poorly functioning right IJ Port-A-Cath EXAM: RIGHT IJ PORT CATHETER INJECTION Date:  2/16/20182/16/2018 11:19 am Radiologist:  M. Daryll Brod, MD Guidance:  Fluoroscopic FLUOROSCOPY TIME:  6 seconds (0.1 mGy). MEDICATIONS: None. ANESTHESIA/SEDATION: None. CONTRAST:  68m ISOVUE-300 IOPAMIDOL (ISOVUE-300) INJECTION 694%COMPLICATIONS: None immediate. PROCEDURE: Informed consent was obtained from the patient following explanation of the procedure, risks, benefits and alternatives. The patient understands, agrees and consents for the procedure. All questions were addressed. A time out was performed. Under sterile conditions, the existing accessed right IJ port catheter was injected with contrast. Subtraction imaging performed. FINDINGS: Port catheter tip is in the SVC proximally, just distal to the innominate junction. Trace amount of fibrin sheath at the catheter tip. SVC is patent. Port catheter tubing is intact. Acute angulation noted of  the port catheter course in the right neck. IMPRESSION: Small amount of fiber sheath at the catheter tip. Tip position in the proximal SVC. No discontinuity or extravasation. SVC remains patent. Electronically Signed   By: Jerilynn Mages.  Shick M.D.   On: 01/10/2017  11:25   CT Angio Chest 10/05/2016 IMPRESSION: No evidence of significant pulmonary embolus. No evidence of active pulmonary disease. No evidence of metastasis in the chest.  ASSESSMENT & PLAN:  51 y.o. premenopausal Caucasian female, presented with a palpable right breast mass.  1. 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 -Due to the clinically rapid growing tumor, moderate size, positive lymph nodes, high-grade, high Ki-67, this is likely a aggressive tumor, the risk of recurrence after surgery is likely high. -I previously recommended her to consider neoadjuvant chemotherapy, to shrink the tumor, make the lumpectomy more feasible, and potentially avoid axillary lymph node dissection. 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 -She has decided to have double mastectomy, despite negative genetics. she is extremely concerned about the risk of future breast cancer, I will let Dr. Donne Hazel know  -Due to her worsening neuropathy after 3 cycles, I do not believe she can tolerate 12 cycles of treatment with Taxol. Will switch to docetaxel every 3 weeks, for additional 2-3 cycles. The benefit and side effects were reviewed with her, especially neutropenia, anemia, need for blood transfusion, diarrhea, anorexia, and worsening neuropathy, she voiced good understanding, and agrees to proceed. -After previous lengthy discussion, patient agreed to start docetaxel 60 mg/m2, will  start TODAY. The patient declined Neulasta, due to previous severe bone pain. -Referral to plastic surgeon for reconstruction after double mastectomy.  2. Genetics  -Her young age and strong family history of breast cancer, we'll refer her to his genetics for testing, to rule out inheritable genetic syndrome.  3. Anxiety  and insomnia -She has been quite anxious since cancer diagnosis, but does not feel she needs medication -She has tried Ambien, do not like it  -continue lorazepam 0.5 mg as needed for insomnia and anxiety  4. Neuropathy, G2 -Chemotherapy induced. -Numbness and tingling of her fingers and toes. -Previously prescribed Neurontin, I recommended her to start at a low dose 100 mg at night, and gradually titrate the dose  5. Hot flush and night sweats -She has tried Effexor for a few months, and not feel helpful, she has stop it.  6. Diarrhea and abdominal cramps -Secondary to Taxol, worse after week 2 treatment -She will continue using Imodium and Lomotil as needed, improved lately.  7. Port issue  -she has had a difficulty on blood drawn from her port daily. -Dye study on 01/10/17 showed acute angulation of support the neck, I sent a message to Dr. Donne Hazel to see if he has any suggestions. Port is fine to use.  Plan  -Start Docetaxel 60 mg/m2 today (first cycle with dose 20% reduction). Patient declined Neulasta due to bone pain from prior injection. -Return in 3 weeks for 2nd cycle Docetaxil. Will determine dosage  depending on patient's tolerance. Planning for total of 2-3 cycles if her neuropathy is stable. -continue Neurontin 100 mg at bed time, and gradually increase dose, also increase to 3 times a day PRN. She will try to increase the dose next week.  All questions were answered. The patient knows to call the clinic with any problems, questions or concerns.  I spent 25 minutes counseling the patient face to face. The total time spent in the appointment was 30  minutes and more than 50% was on counseling.    Truitt Merle, MD 01/24/2017   This document serves as a record of services personally performed by Truitt Merle, MD. It was created on her behalf by Darcus Austin, a trained medical scribe. The creation of this record is based on the scribe's personal observations and the provider's statements to them. This document has been checked and approved by the attending provider.

## 2017-01-24 ENCOUNTER — Ambulatory Visit (HOSPITAL_BASED_OUTPATIENT_CLINIC_OR_DEPARTMENT_OTHER): Payer: 59 | Admitting: Hematology

## 2017-01-24 ENCOUNTER — Ambulatory Visit (HOSPITAL_BASED_OUTPATIENT_CLINIC_OR_DEPARTMENT_OTHER): Payer: 59

## 2017-01-24 ENCOUNTER — Ambulatory Visit: Payer: 59

## 2017-01-24 ENCOUNTER — Telehealth: Payer: Self-pay | Admitting: Hematology

## 2017-01-24 ENCOUNTER — Encounter: Payer: Self-pay | Admitting: Hematology

## 2017-01-24 ENCOUNTER — Other Ambulatory Visit (HOSPITAL_BASED_OUTPATIENT_CLINIC_OR_DEPARTMENT_OTHER): Payer: 59

## 2017-01-24 VITALS — BP 116/56 | HR 70 | Temp 98.8°F | Resp 18 | Ht 60.5 in | Wt 182.9 lb

## 2017-01-24 VITALS — BP 118/64 | HR 79 | Temp 98.6°F | Resp 18

## 2017-01-24 DIAGNOSIS — Z5111 Encounter for antineoplastic chemotherapy: Secondary | ICD-10-CM | POA: Diagnosis not present

## 2017-01-24 DIAGNOSIS — F419 Anxiety disorder, unspecified: Secondary | ICD-10-CM | POA: Diagnosis not present

## 2017-01-24 DIAGNOSIS — C50511 Malignant neoplasm of lower-outer quadrant of right female breast: Secondary | ICD-10-CM | POA: Diagnosis not present

## 2017-01-24 DIAGNOSIS — G4700 Insomnia, unspecified: Secondary | ICD-10-CM | POA: Diagnosis not present

## 2017-01-24 DIAGNOSIS — N951 Menopausal and female climacteric states: Secondary | ICD-10-CM | POA: Diagnosis not present

## 2017-01-24 DIAGNOSIS — Z170 Estrogen receptor positive status [ER+]: Secondary | ICD-10-CM

## 2017-01-24 DIAGNOSIS — C773 Secondary and unspecified malignant neoplasm of axilla and upper limb lymph nodes: Secondary | ICD-10-CM

## 2017-01-24 LAB — CBC WITH DIFFERENTIAL/PLATELET
BASO%: 0.3 % (ref 0.0–2.0)
Basophils Absolute: 0 10*3/uL (ref 0.0–0.1)
EOS%: 2.6 % (ref 0.0–7.0)
Eosinophils Absolute: 0.1 10*3/uL (ref 0.0–0.5)
HCT: 35.1 % (ref 34.8–46.6)
HGB: 11.9 g/dL (ref 11.6–15.9)
LYMPH%: 24.8 % (ref 14.0–49.7)
MCH: 32.2 pg (ref 25.1–34.0)
MCHC: 34.1 g/dL (ref 31.5–36.0)
MCV: 94.5 fL (ref 79.5–101.0)
MONO#: 0.4 10*3/uL (ref 0.1–0.9)
MONO%: 9.1 % (ref 0.0–14.0)
NEUT#: 3 10*3/uL (ref 1.5–6.5)
NEUT%: 63.2 % (ref 38.4–76.8)
PLATELETS: 236 10*3/uL (ref 145–400)
RBC: 3.71 10*6/uL (ref 3.70–5.45)
RDW: 16 % — ABNORMAL HIGH (ref 11.2–14.5)
WBC: 4.8 10*3/uL (ref 3.9–10.3)
lymph#: 1.2 10*3/uL (ref 0.9–3.3)

## 2017-01-24 LAB — COMPREHENSIVE METABOLIC PANEL
ALT: 25 U/L (ref 0–55)
ANION GAP: 9 meq/L (ref 3–11)
AST: 19 U/L (ref 5–34)
Albumin: 4.1 g/dL (ref 3.5–5.0)
Alkaline Phosphatase: 72 U/L (ref 40–150)
BUN: 11 mg/dL (ref 7.0–26.0)
CHLORIDE: 106 meq/L (ref 98–109)
CO2: 25 meq/L (ref 22–29)
CREATININE: 0.7 mg/dL (ref 0.6–1.1)
Calcium: 9.6 mg/dL (ref 8.4–10.4)
EGFR: >90 mL/min/{1.73_m2} (ref >90)
Glucose: 104 mg/dl (ref 70–140)
Potassium: 4.4 mEq/L (ref 3.5–5.1)
SODIUM: 140 meq/L (ref 136–145)
Total Bilirubin: 0.28 mg/dL (ref 0.20–1.20)
Total Protein: 7.5 g/dL (ref 6.4–8.3)

## 2017-01-24 MED ORDER — HEPARIN SOD (PORK) LOCK FLUSH 100 UNIT/ML IV SOLN
500.0000 [IU] | Freq: Once | INTRAVENOUS | Status: AC | PRN
  Administered 2017-01-24: 500 [IU]
  Filled 2017-01-24: qty 5

## 2017-01-24 MED ORDER — SODIUM CHLORIDE 0.9% FLUSH
10.0000 mL | INTRAVENOUS | Status: DC | PRN
  Administered 2017-01-24: 10 mL
  Filled 2017-01-24: qty 10

## 2017-01-24 MED ORDER — SODIUM CHLORIDE 0.9 % IV SOLN
Freq: Once | INTRAVENOUS | Status: AC
  Administered 2017-01-24: 10:00:00 via INTRAVENOUS

## 2017-01-24 MED ORDER — DEXAMETHASONE SODIUM PHOSPHATE 10 MG/ML IJ SOLN
INTRAMUSCULAR | Status: AC
  Filled 2017-01-24: qty 1

## 2017-01-24 MED ORDER — DOCETAXEL CHEMO INJECTION 160 MG/16ML
60.0000 mg/m2 | Freq: Once | INTRAVENOUS | Status: AC
  Administered 2017-01-24: 110 mg via INTRAVENOUS
  Filled 2017-01-24: qty 11

## 2017-01-24 MED ORDER — DEXAMETHASONE SODIUM PHOSPHATE 4 MG/ML IJ SOLN
10.0000 mg | Freq: Once | INTRAMUSCULAR | Status: AC
  Administered 2017-01-24: 10 mg via INTRAVENOUS
  Filled 2017-01-24: qty 3

## 2017-01-24 NOTE — Patient Instructions (Signed)

## 2017-01-24 NOTE — Patient Instructions (Signed)
[facility] Discharge Instructions for Patients Receiving Chemotherapy  Today you received the following chemotherapy agents Taxotere  To help prevent nausea and vomiting after your treatment, we encourage you to take your nausea medication as prescribed  If you develop nausea and vomiting that is not controlled by your nausea medication, call the clinic.   BELOW ARE SYMPTOMS THAT SHOULD BE REPORTED IMMEDIATELY:  *FEVER GREATER THAN 100.5 F  *CHILLS WITH OR WITHOUT FEVER  NAUSEA AND VOMITING THAT IS NOT CONTROLLED WITH YOUR NAUSEA MEDICATION  *UNUSUAL SHORTNESS OF BREATH  *UNUSUAL BRUISING OR BLEEDING  TENDERNESS IN MOUTH AND THROAT WITH OR WITHOUT PRESENCE OF ULCERS  *URINARY PROBLEMS  *BOWEL PROBLEMS  UNUSUAL RASH Items with * indicate a potential emergency and should be followed up as soon as possible.  Feel free to call the clinic you have any questions or concerns. The clinic phone number is [phone].  Please show the Will at check-in to the Emergency Department and triage nurse.    Docetaxel injection What is this medicine? DOCETAXEL (doe se TAX el) is a chemotherapy drug. It targets fast dividing cells, like cancer cells, and causes these cells to die. This medicine is used to treat many types of cancers like breast cancer, certain stomach cancers, head and neck cancer, lung cancer, and prostate cancer. This medicine may be used for other purposes; ask your health care provider or pharmacist if you have questions. COMMON BRAND NAME(S): Docefrez, Taxotere What should I tell my health care provider before I take this medicine? They need to know if you have any of these conditions: -infection (especially a virus infection such as chickenpox, cold sores, or herpes) -liver disease -low blood counts, like low white cell, platelet, or red cell counts -an unusual or allergic reaction to docetaxel, polysorbate 80, other chemotherapy agents,  other medicines, foods, dyes, or preservatives -pregnant or trying to get pregnant -breast-feeding How should I use this medicine? This drug is given as an infusion into a vein. It is administered in a hospital or clinic by a specially trained health care professional. Talk to your pediatrician regarding the use of this medicine in children. Special care may be needed. Overdosage: If you think you have taken too much of this medicine contact a poison control center or emergency room at once. NOTE: This medicine is only for you. Do not share this medicine with others. What if I miss a dose? It is important not to miss your dose. Call your doctor or health care professional if you are unable to keep an appointment. What may interact with this medicine? -cyclosporine -erythromycin -ketoconazole -medicines to increase blood counts like filgrastim, pegfilgrastim, sargramostim -vaccines Talk to your doctor or health care professional before taking any of these medicines: -acetaminophen -aspirin -ibuprofen -ketoprofen -naproxen This list may not describe all possible interactions. Give your health care provider a list of all the medicines, herbs, non-prescription drugs, or dietary supplements you use. Also tell them if you smoke, drink alcohol, or use illegal drugs. Some items may interact with your medicine. What should I watch for while using this medicine? Your condition will be monitored carefully while you are receiving this medicine. You will need important blood work done while you are taking this medicine. This drug may make you feel generally unwell. This is not uncommon, as chemotherapy can affect healthy cells as well as cancer cells. Report any side effects. Continue your course of treatment even though you feel ill unless  your doctor tells you to stop. In some cases, you may be given additional medicines to help with side effects. Follow all directions for their use. Call your doctor  or health care professional for advice if you get a fever, chills or sore throat, or other symptoms of a cold or flu. Do not treat yourself. This drug decreases your body's ability to fight infections. Try to avoid being around people who are sick. This medicine may increase your risk to bruise or bleed. Call your doctor or health care professional if you notice any unusual bleeding. This medicine may contain alcohol in the product. You may get drowsy or dizzy. Do not drive, use machinery, or do anything that needs mental alertness until you know how this medicine affects you. Do not stand or sit up quickly, especially if you are an older patient. This reduces the risk of dizzy or fainting spells. Avoid alcoholic drinks. Do not become pregnant while taking this medicine. Women should inform their doctor if they wish to become pregnant or think they might be pregnant. There is a potential for serious side effects to an unborn child. Talk to your health care professional or pharmacist for more information. Do not breast-feed an infant while taking this medicine. What side effects may I notice from receiving this medicine? Side effects that you should report to your doctor or health care professional as soon as possible: -allergic reactions like skin rash, itching or hives, swelling of the face, lips, or tongue -low blood counts - This drug may decrease the number of white blood cells, red blood cells and platelets. You may be at increased risk for infections and bleeding. -signs of infection - fever or chills, cough, sore throat, pain or difficulty passing urine -signs of decreased platelets or bleeding - bruising, pinpoint red spots on the skin, black, tarry stools, nosebleeds -signs of decreased red blood cells - unusually weak or tired, fainting spells, lightheadedness -breathing problems -fast or irregular heartbeat -low blood pressure -mouth sores -nausea and vomiting -pain, swelling, redness or  irritation at the injection site -pain, tingling, numbness in the hands or feet -swelling of the ankle, feet, hands -weight gain Side effects that usually do not require medical attention (report to your doctor or health care professional if they continue or are bothersome): -bone pain -complete hair loss including hair on your head, underarms, pubic hair, eyebrows, and eyelashes -diarrhea -excessive tearing -changes in the color of fingernails -loosening of the fingernails -nausea -muscle pain -red flush to skin -sweating -weak or tired This list may not describe all possible side effects. Call your doctor for medical advice about side effects. You may report side effects to FDA at 1-800-FDA-1088. Where should I keep my medicine? This drug is given in a hospital or clinic and will not be stored at home. NOTE: This sheet is a summary. It may not cover all possible information. If you have questions about this medicine, talk to your doctor, pharmacist, or health care provider.  2018 [employer] (2015-12-14 12:32:56)

## 2017-01-24 NOTE — Telephone Encounter (Signed)
Appointments scheduled per 3/2 LOS.

## 2017-01-27 ENCOUNTER — Telehealth: Payer: Self-pay

## 2017-01-27 ENCOUNTER — Ambulatory Visit: Payer: 59

## 2017-01-27 NOTE — Telephone Encounter (Signed)
-----   Message from Thomasene Lot, RN sent at 01/24/2017  5:38 PM EST ----- Regarding: Dr Burr Medico 1st chemo f/u call 1st chemo f/u call

## 2017-01-27 NOTE — Telephone Encounter (Signed)
Chemo follow up call: she has extremely dry mouth, she had diarrhea Friday night and Saturday. She used imodium that did not help but lomotil did help, had about 12 stools Saturday. No diarrhea now. Used medication for nausea that also helped. No vomiting. She is drinking 4-5 16oz bottles of water per day. States a little fatigue but not worse than taxol. "I feel I am doing OK". Instructed her to call if she needs anything.

## 2017-01-30 ENCOUNTER — Encounter: Payer: Self-pay | Admitting: Hematology

## 2017-01-30 ENCOUNTER — Other Ambulatory Visit: Payer: Self-pay | Admitting: Hematology

## 2017-01-30 DIAGNOSIS — C50511 Malignant neoplasm of lower-outer quadrant of right female breast: Secondary | ICD-10-CM

## 2017-01-30 DIAGNOSIS — Z170 Estrogen receptor positive status [ER+]: Principal | ICD-10-CM

## 2017-01-31 ENCOUNTER — Other Ambulatory Visit: Payer: Self-pay | Admitting: *Deleted

## 2017-01-31 ENCOUNTER — Telehealth: Payer: Self-pay | Admitting: *Deleted

## 2017-01-31 DIAGNOSIS — Z170 Estrogen receptor positive status [ER+]: Principal | ICD-10-CM

## 2017-01-31 DIAGNOSIS — C50511 Malignant neoplasm of lower-outer quadrant of right female breast: Secondary | ICD-10-CM

## 2017-01-31 MED ORDER — AMOXICILLIN-POT CLAVULANATE 875-125 MG PO TABS: 1.0000 | ORAL_TABLET | Freq: Two times a day (BID) | ORAL | 0 refills | Status: AC

## 2017-01-31 MED ORDER — OXYCODONE-ACETAMINOPHEN 5-325 MG PO TABS: 1.0000 | ORAL_TABLET | Freq: Three times a day (TID) | ORAL | 0 refills | Status: DC | PRN

## 2017-01-31 MED ORDER — ALPRAZOLAM 0.5 MG PO TABS: 0.5000 mg | ORAL_TABLET | Freq: Every evening | ORAL | 0 refills | Status: DC | PRN

## 2017-01-31 NOTE — Telephone Encounter (Signed)
Myrtle, could you call in augmentin 875mg  bid for 7 days? thanks  Truitt Merle MD

## 2017-01-31 NOTE — Telephone Encounter (Signed)
Patient called stating that she has been feeling congested for 3 days similar to a sinus infection. Patient denies fever but she does complain of sore throat, bone pain and green drainage. Patient would like to know if an antibiotic can be called in. Patient recent infusiono (Taxotere) was 01/24/17.

## 2017-02-10 ENCOUNTER — Encounter (HOSPITAL_COMMUNITY): Payer: Self-pay

## 2017-02-10 ENCOUNTER — Ambulatory Visit (HOSPITAL_COMMUNITY)
Admission: RE | Admit: 2017-02-10 | Discharge: 2017-02-10 | Disposition: A | Discharge status: Home / Self Care | Payer: 59 | Source: Ambulatory Visit | Attending: Cardiology | Admitting: Cardiology

## 2017-02-10 ENCOUNTER — Ambulatory Visit (HOSPITAL_BASED_OUTPATIENT_CLINIC_OR_DEPARTMENT_OTHER)
Admission: RE | Admit: 2017-02-10 | Discharge: 2017-02-10 | Disposition: A | Discharge status: Home / Self Care | Payer: 59 | Source: Ambulatory Visit | Attending: Cardiology | Admitting: Cardiology

## 2017-02-10 VITALS — BP 123/69 | HR 73 | Wt 185.8 lb

## 2017-02-10 DIAGNOSIS — I359 Nonrheumatic aortic valve disorder, unspecified: Secondary | ICD-10-CM | POA: Diagnosis not present

## 2017-02-10 DIAGNOSIS — Z09 Encounter for follow-up examination after completed treatment for conditions other than malignant neoplasm: Secondary | ICD-10-CM | POA: Insufficient documentation

## 2017-02-10 DIAGNOSIS — C50511 Malignant neoplasm of lower-outer quadrant of right female breast: Secondary | ICD-10-CM | POA: Diagnosis not present

## 2017-02-10 DIAGNOSIS — I348 Other nonrheumatic mitral valve disorders: Secondary | ICD-10-CM | POA: Diagnosis not present

## 2017-02-10 DIAGNOSIS — Z171 Estrogen receptor negative status [ER-]: Secondary | ICD-10-CM

## 2017-02-10 DIAGNOSIS — I358 Other nonrheumatic aortic valve disorders: Secondary | ICD-10-CM | POA: Diagnosis not present

## 2017-02-10 LAB — ECHOCARDIOGRAM COMPLETE
FS: 31 % (ref 28–44)
IV/PV OW: 1.02
LA diam end sys: 38 mm
LA vol A4C: 31.4 ml
LADIAMINDEX: 2.1 cm/m2
LASIZE: 38 mm
LV PW d: 8.57 mm — AB (ref 0.6–1.1)
LV TDI E'LATERAL: 10.1
LV e' LATERAL: 10.1 cm/s
LVOT VTI: 24.2 cm
LVOT area: 2.01 cm2
LVOT peak grad rest: 6 mmHg
LVOT peak vel: 124 cm/s
LVOTD: 16 mm
LVOTSV: 49 mL
RV LATERAL S' VELOCITY: 9.68 cm/s
TAPSE: 22.2 mm
TDI e' medial: 9.36

## 2017-02-10 NOTE — Patient Instructions (Signed)
We will contact you in 6 months to schedule your next appointment and echocardiogram  

## 2017-02-10 NOTE — Progress Notes (Signed)
  Echocardiogram 2D Echocardiogram has been performed.  April Orozco 02/10/2017, 2:04 PM

## 2017-02-11 NOTE — Progress Notes (Signed)
[place]  [phone] [phone]  Clinic Follow up Note   Patient Care Team: No Pcp Per Patient as PCP - General (General Practice) Rolm Bookbinder, MD as Consulting Physician (General Surgery) Truitt Merle, MD as Consulting Physician (Hematology) Kyung Rudd, MD as Consulting Physician (Radiation Oncology) 02/14/2017  CHIEF COMPLAINTS:  Follow up right breast cancer  Oncology History   Malignant neoplasm of lower-outer quadrant of right female breast [facility])   Staging form: Breast, AJCC 7th Edition   - Clinical stage from 09/12/2016: Stage IIB (T2, N1, M0) - Signed by Truitt Merle, MD on 09/18/2016      Malignant neoplasm of lower-outer quadrant of right female breast ([place])   09/10/2016 Mammogram    Mammogram and ultrasound showed a 2.7 cm mass at 8 clock position of the right breast, there is also a lobulated lymph nodes in the right axilla, slightly suspicious for malignancy.       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 Docetaxel 62m/m2 on 01/24/17, was to be given every 3 weeks but stopped due to worsening neuropathy (only 1 cycle given)      11/20/2016 Genetic Testing    Negative genetic testing on the Breast/Ovarian cancer panel.  Negative genetic testing for the MSH2 inversion analysis ([place]ol[place] inversion). The Breast/Ovarian  gene panel offered by [facility] includes sequencing and rearrangement analysis for the following 20 genes:  ATM, BARD1, BRCA1, BRCA2, BRIP1, CDH1, CHEK2, EPCAM, FANCC, MLH1, MSH2, MSH6, NBN, PALB2, PMS2, PTEN, RAD51C, RAD51D, TP53, and XRCC2.   The [facility] date [facility] and November 20, 2016 for the BSargeant[facility]version.        HISTORY OF PRESENTING ILLNESS:  April ROCKHOLT89y.o. female is here because of her recently diagnosed right breast cancer. She is accompanied by her boyfriend to our multidisciplinary breast clinic today.   She noticed a right breast lump 3 monthsa ago, no pain or tenderness, no skin change or nipple discharge. She feels the lump has been getting bigger over the past 3 months. She called her gynecologist and was finally seen a few weeks ago. She was referred to have a diagnostic mammogram and ultrasound which showed a 2.7 cm mass at the 8:00 position of the right breast, and also a lobulated lymph nodes in the right axilla. 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  She feels well, denies any skin pain, or other symptoms. No recent weight loss. 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 Her husband died from small cell cancer several years ago, she has 5 daughters. She recently found out that 3 of her paternal cousins had breast cancer.  GYN HISTORY  Menarchal: 17 LMP: regular  Contraceptive: no  HRT: n/a  G5P5: 5 daughters 30-19  CURRENT THERAPY: Neoadjuvant Adriamycin and Cytoxan, every 2 weeks, with Neulasta, for total 4 cycles, followed by weekly Taxol for 12 weeks. 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 Docetaxel 47m/m2 on 01/24/17, was to be given every 3 weeks but stopped due to worsening neuropathy (only 1 cycle given)  INTERIM  HISTORY: SMaisareturns for follow-up and 2nd cycle of Docetaxel. The patient reports neuropathy in her hands and feet is somewhat worse. Describes it as a burning, numbing, and stinging. She reports swelling and stiffness of her fingers. She reports dropping objects. She has hot flashes. Diarrhea for 2-3 days after chemo, but resolved now. She has fatigue and cut her work hours. She is still only taking 1 gabapentin a night. She reports having periods before chemotherapy, but none at this time.  MEDICAL HISTORY:  Past Medical History:  Diagnosis Date  . [place] sacrum[place]coccyx [place]   "Shattered tailbone"  . Heart murmur    [facility] has evaluated, felt benign  . Malignant [place] of [place] quadrant of [place] 09/13/2016  . Tachycardia    Intermittent, resolved with decreased caffeine intake    SURGICAL HISTORY: Past Surgical History:  Procedure Laterality Date  . APPENDECTOMY    . IR GENERIC HISTORICAL  01/10/2017   IR CV LINE INJECTION 01/10/2017 MGreggory Keen MD [facility]-INTERV RAD  . PORTACATH PLACEMENT Right 09/23/2016   Procedure: INSERTION PORT-A-CATH WITH UKorea  Surgeon: MRolm Bookbinder MD;  [facility] [facility]  Service: General;  Laterality: Right;  . TONSILECTOMY/ADENOIDECTOMY WITH MYRINGOTOMY    . TUBAL LIGATION      SOCIAL HISTORY: Social History   Social History  . Marital status: Widowed    Spouse name: N/A  . Number of children: 5  . Years of education: N/A   Occupational History  . 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 Smoking status: Never Smoker  . Smokeless tobacco: Never Used  . Alcohol use Yes     Comment: social drinker   . Drug use: No  . Sexual activity: Not on file   Other Topics Concern  . Not on file   Social History Narrative  . No narrative on file    FAMILY HISTORY: Family History  Problem Relation Age of Onset  . Diabetes Mother   . Diabetes Father    . Skin cancer Father 759   NOS type; worked as a rTheme park managerfor many years  . Hypertension Other   . Breast cancer Cousin 485   paternal 1st cousin; w/ mets to bone  . Breast cancer Cousin     paternal 1st cousin dx 439-50 s/p lump  . Breast cancer Cousin     paternal 1st cousin dx 556-53w/ mets to LN; s/p BL mastectomies  . Heart disease Maternal Aunt 58  . Colon cancer Paternal Uncle     dx 67-68  . Goiter Maternal Grandmother     d. bled to death following goiter surgery  . Heart attack Maternal Grandfather     d. early 773s . Lung cancer Paternal Grandmother     dx 841s d. 88y  . Diabetes Paternal Grandmother   . Heart failure Paternal Grandfather     d. 75-76  . Alzheimer's disease Paternal Grandfather   . Other Daughter 173  daughter w/ hx benign breast lump/cyst removed  . Epilepsy Maternal Aunt   . Alzheimer's disease Paternal Aunt   . Brain cancer Cousin 55    paternal 1st cousin; d. 79y; NOS type    ALLERGIES:  is allergic to banana; sulfur; and tramadol.  MEDICATIONS:  Current Outpatient Prescriptions  Medication Sig Dispense Refill  . ALPRAZolam (XANAX) 0.5 MG tablet Take 1 tablet (0.5 mg total) by mouth at bedtime as needed for anxiety. 20 tablet 0  . diphenoxylate-atropine (LOMOTIL) 2.5-0.025 MG tablet Take 1-2 tablets by mouth 4 (four) times daily as needed for diarrhea or loose stools. 60 tablet 2  . docusate sodium (COLACE) 50 MG capsule Take 50 mg by mouth 2 (two) times daily.    Marland Kitchen gabapentin (NEURONTIN) 100 MG capsule Take 1 capsule (100 mg total) by mouth 3 (three) times daily. 90 capsule 0  . ibuprofen (ADVIL,MOTRIN) 200 MG tablet Take 800 mg by mouth every 6 (six) hours as needed for headache, mild pain or moderate pain.    Marland Kitchen lidocaine-prilocaine (EMLA) cream Apply 1 application topically as needed. 30 g 1  . loperamide (IMODIUM) 2 MG capsule Take 2 mg by mouth as needed for diarrhea or loose stools.    . magic mouthwash SOLN Take 5 mLs by mouth 4 (four)  times daily as needed for mouth pain. 240 mL 0  . nystatin cream (MYCOSTATIN) Apply 1 application topically 2 (two) times daily. 30 g 0  . ondansetron (ZOFRAN) 8 MG tablet Take 1 tablet (8 mg total) by mouth 2 (two) times daily as needed for nausea or vomiting. 20 tablet 1  . prochlorperazine (COMPAZINE) 10 MG tablet Take 1 tablet (10 mg total) by mouth every 6 (six) hours as needed for nausea or vomiting. 30 tablet 1  . zolpidem (AMBIEN) 5 MG tablet Take 1 tablet (5 mg total) by mouth at bedtime as needed for sleep. 30 tablet 1  . oxyCODONE-acetaminophen (PERCOCET/ROXICET) 5-325 MG tablet Take 1 tablet by mouth every 8 (eight) hours as needed for severe pain. (Patient not taking: Reported on 02/10/2017) 30 tablet 0  . tamoxifen (NOLVADEX) 20 MG tablet Take 1 tablet (20 mg total) by mouth daily. 30 tablet 1   Current Facility-Administered Medications  Medication Dose Route Frequency Provider Last Rate Last Dose  . alteplase (CATHFLO ACTIVASE) injection 2 mg  2 mg Intracatheter Once PRN Truitt Merle, MD      . heparin lock flush 100 unit/mL  500 Units Intracatheter Once PRN Truitt Merle, MD        REVIEW OF SYSTEMS: Constitutional: Denies fevers, chills (+) night sweats, fatigue, hot flashes Eyes: Denies blurriness of vision, double vision or watery eyes Ears, nose, mouth, throat, and face: Denies mucositis or sore throat Respiratory: Denies dyspnea or wheezes Cardiovascular: Denies palpitation, chest discomfort or lower extremity swelling Gastrointestinal:  Denies nausea, heartburn,  Skin: Denies abnormal skin rashes Lymphatics: Denies new lymphadenopathy or easy bruising (+) swelling in hands and feet Neurological:Denies new weaknesses (+) tingling and numbness in hands and feet Behavioral/Psych: Mood is stable, no new changes (+) depression All other systems were reviewed with the patient and are negative.  PHYSICAL EXAMINATION: ECOG PERFORMANCE STATUS: 1  Vitals:   02/14/17 1014  BP: 114/77   Pulse: 75  Temp: 98.5 F (36.9 C)   Filed Weights   02/14/17 1014  Weight: 174 lb 4.8 oz (79.1 kg)   GENERAL:alert, no distress and comfortable (+) alopecia  SKIN: skin color, texture, turgor are  normal, no rashes or significant lesions EYES: normal, conjunctiva are pink and non-injected, sclera clear OROPHARYNX:no exudate, no erythema and lips, buccal mucosa, and tongue normal  NECK: supple, thyroid normal size, non-tender, without nodularity LYMPH:  no palpable lymphadenopathy in the cervical, axillary or inguinal LUNGS: clear to auscultation and percussion with normal breathing effort HEART: regular rate & rhythm and no murmurs and no lower extremity edema ABDOMEN:abdomen soft, non-tender and normal bowel sounds Musculoskeletal:no cyanosis of digits and no clubbing  PSYCH: alert & oriented x 3 with fluent speech NEURO: (+) Significant decrease vibration sensation in the extremities. Most prominent on the right upper extremity. Breasts: Breast inspection showed them to be symmetrical with no nipple discharge. Palpation of the breasts and axilla revealed a 1 cm mass at 8:00 position close to right areola, smaller than before, moveable, no other obvious mass that I could appreciate.   LABORATORY DATA:  I have reviewed the data as listed CBC Latest Ref Rng & Units 02/14/2017 01/24/2017 01/17/2017  WBC 3.9 - 10.3 10e3/uL 5.5 4.8 6.3  Hemoglobin 11.6 - 15.9 g/dL 12.5 11.9 11.9  Hematocrit 34.8 - 46.6 % 37.5 35.1 35.0  Platelets 145 - 400 10e3/uL 240 236 252   CMP Latest Ref Rng & Units 02/14/2017 01/24/2017 01/17/2017  Glucose 70 - 140 mg/dl 170(H) 104 111  BUN 7.0 - 26.0 mg/dL 14.7 11.0 15.0  Creatinine 0.6 - 1.1 mg/dL 0.8 0.7 0.7  Sodium 136 - 145 mEq/L 138 140 139  Potassium 3.5 - 5.1 mEq/L 4.2 4.4 3.8  Chloride 101 - 111 mmol/L - - -  CO2 22 - 29 mEq/L _0 Calcium 8.4 - 10.4 mg/dL 9.5 9.6 9.7  Total Protein 6.4 - 8.3 g/dL 7.6 7.5 7.6  Total Bilirubin 0.20 - 1.20 mg/dL <0.22  0.28 0.28  Alkaline Phos 40 - 150 U/L 74 72 68  AST 5 - 34 U/L _1 ALT 0 - 55 U/L _2 PATHOLOGY REPORTS: Diagnosis 09/12/2016 1. Lymph node, needle/core biopsy, right axilla - METASTATIC CARCINOMA WITH EXTRACAPSULAR EXTENSION. 2. Breast, right, needle core biopsy, 8 o'clock - INVASIVE DUCTAL CARCINOMA, GRADE 3.   RADIOGRAPHIC STUDIES: I have personally reviewed the radiological images as listed and agreed with the findings in the report. No results found.   CT Angio Chest 10/05/2016 IMPRESSION: No evidence of significant pulmonary embolus. No evidence of active pulmonary disease. No evidence of metastasis in the chest.  ECHOCARDIOGRAM 02/10/17 Impressions: - Normal LV size with EF 60-65%. Normal diastolic function. Normal   RV size and systolic function. Aortic valve poorly visualized,   mild turbulence across it but no stenosis.  ASSESSMENT & PLAN:  51 y.o. premenopausal Caucasian female, presented with a palpable right breast mass.  1. 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 -Due to the clinically rapid growing tumor, moderate size, positive lymph nodes, high-grade, high Ki-67, this is likely a aggressive tumor, the risk of recurrence after surgery is likely high. -I previously recommended her to consider neoadjuvant chemotherapy, to shrink the tumor, make the lumpectomy more feasible, and potentially avoid axillary lymph node dissection. 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 -She has decided to have double mastectomy, despite negative genetics. she is extremely concerned about the risk of future breast cancer, I will let Dr. Donne Hazel know. -Due to her worsening neuropathy after 3 cycles of Taxol, I did not believe she can tolerate 12 cycles of treatment with Taxol. She switched to Docetaxel every 3 weeks, for additional 2-3 cycles. The benefit and side effects were reviewed with her, especially neutropenia, anemia, need for blood transfusion, diarrhea, anorexia, and worsening neuropathy, she voiced good understanding, and agreesdto proceed. -After previous lengthy discussion, patient agreed to start docetaxel 60 mg/m2, on 01/24/17. The patient declined Neulasta, due to previous severe bone pain. -The patient has continued and worsening neuropathy from chemotherapy. She is a Marine scientist, very concerned that she may not be able to do her job if her neuropathy gets worse, especially for IV insertion. I recommend her to stop chemotherapy. She agreed  -Breast MRI next week and referral back to Dr. Donne Hazel to finalize her surgery. ---Giving her ER and PR positivity of the tumor cells, and her pre-menopause status, I recommend endocrine therapy with tamoxifen. The potential side effects, which includes but not limited to, hot flash, skin and vaginal dryness, slightly increased risk of cardiovascular disease and cataract, small risk of thrombosis and endometrial cancer, were discussed with her in great details. Preventive strategies for thrombosis, such as being physically active, using compression stocks, avoid cigarette smoking, etc., were reviewed with her. I also recommend her to follow-up with her gynecologist once a year, and watch for vaginal spotting or bleeding, as a clinically sign of endometrial cancer, etc. She voiced good understanding, and agrees to proceed.  -Will start Tamoxifen this week while she is await for  surgery  -Although her genetic testing was negative, she prefers to bilateral mastectomy. -Referral to plastic surgeon Dr. Iran Planas for reconstruction after double mastectomy.  2. Genetics  -Her young age and strong family history of breast cancer, we'll refer her to his genetics for testing, to rule out inheritable genetic syndrome. -Negative genetic testing on the Breast/Ovarian cancer panel.  Negative genetic testing for the MSH2 inversion analysis ([place] inversion). The Breast/Ovarian gene panel offered by [facility] includes sequencing and rearrangement analysis for the following 20 genes:  ATM, BARD1, BRCA1, BRCA2, BRIP1, CDH1, CHEK2, EPCAM, FANCC, MLH1, MSH2, MSH6, NBN, PALB2, PMS2, PTEN, RAD51C, RAD51D, TP53, and XRCC2.   The report date is December 19 for the [facility] panel and November 20, 2016 for the [place] inversion.  3. Anxiety  and insomnia -She has been quite anxious since cancer diagnosis, but does not feel she needs medication -She has tried Ambien, do not like it  -continue lorazepam 0.5 mg as needed for insomnia and anxiety  4. Neuropathy, G2 -Chemotherapy induced. -Numbness and tingling of her fingers and toes. -Previously prescribed Neurontin, I recommended her to increase from 100 mg to 258m at night, and gradually titrate the dose to 3088mtid if needed   5. Hot flash and night sweats -She has tried Effexor for a few months, and not feel helpful, she has stop it.  6. Diarrhea and abdominal cramps -Secondary to Taxol, worse after week 2 treatment -She will continue using Imodium and Lomotil as needed, improved lately.  7. Port issue  -she has had a difficulty on blood drawn from her  port daily. -Dye study on 01/10/17 showed acute angulation of support the neck, I sent a message to Dr. Donne Hazel to see if he has any suggestions. Port is fine to use.  Plan  -We decided to hold chemotherapy due to her worsening neuropathy, and she will proceed surgery next. -Bilateral  breast MRI next week. -Referral to plastic surgery (Dr. Iran Planas) and f/u with Dr. Donne Hazel. -Lab and f/u in 3 weeks. -Start Tamoxifen this week -continue Neurontin 100 mg at bed time, and gradually increase dose to 200-373m, also increase to 3 times a day PRN. She will try to increase the dose.  All questions were answered. The patient knows to call the clinic with any problems, questions or concerns.  I spent 25 minutes counseling the patient face to face. The total time spent in the appointment was 30 minutes and more than 50% was on counseling.    FTruitt Merle MD 02/14/2017   This document serves as a record of services personally performed by YTruitt Merle MD. It was created on her behalf by JDarcus Austin a trained medical scribe. The creation of this record is based on the scribe's personal observations and the provider's statements to them. This document has been checked and approved by the attending provider.

## 2017-02-11 NOTE — Progress Notes (Signed)
Oncology: Dr. Burr Medico  51 yo with breast cancer presents for cardio-oncology evaluation.  Diagnosed in 10/17, ER+/PR+/HER2-.  She has completed chemotherapy: Adriamycin + Cytoxan x 4 cycles followed by Taxol x 12 weeks.  She will need double mastectomy.  No history of cardiac problems.  She would note mild dyspnea for a couple of days after Taxol, then it would resolve.  No chest pain.  She has developed peripheral neuropathy.    PMH: 1. Heart murmur 2. Breast cancer: Diagnosed in 10/17, ER+/PR+/HER2-.  Adriamycin + Cytoxan x 4 cycles followed by Taxol x 12 weeks.  She will need double mastectomy.  - Echo (11/17): EF 60-65%.  - Echo (3/18): EF 64-40%, normal diastolic function, normal RV size and systolic function, aortic valve poorly visualized but there is mild turbulence across the aortic valve without significant stenosis or regurgitation.   Social History   Social History  . Marital status: Widowed    Spouse name: N/A  . Number of children: 5  . Years of education: N/A   Occupational History  . 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 Smoking status: Never Smoker  . Smokeless tobacco: Never Used  . Alcohol use Yes     Comment: social drinker   . Drug use: No  . Sexual activity: Not on file   Other Topics Concern  . Not on file   Social History Narrative  . No narrative on file   Family History  Problem Relation Age of Onset  . Diabetes Mother   . Diabetes Father   . Skin cancer Father 86    NOS type; worked as a Theme park manager for many years  . Hypertension Other   . Breast cancer Cousin 53    paternal 1st cousin; w/ mets to bone  . Breast cancer Cousin     paternal 1st cousin dx 28-50; s/p lump  . Breast cancer Cousin     paternal 1st cousin dx 76-53 w/ mets to LN; s/p BL mastectomies  . Heart disease Maternal Aunt 58  . Colon cancer Paternal Uncle     dx 67-68  . Goiter Maternal Grandmother     d. bled to death following goiter surgery  .  Heart attack Maternal Grandfather     d. early 55s  . Lung cancer Paternal Grandmother     dx 58s; d. 88y  . Diabetes Paternal Grandmother   . Heart failure Paternal Grandfather     d. 75-76  . Alzheimer's disease Paternal Grandfather   . Other Daughter 98    daughter w/ hx benign breast lump/cyst removed  . Epilepsy Maternal Aunt   . Alzheimer's disease Paternal Aunt   . Brain cancer Cousin 38    paternal 1st cousin; d. 35y; NOS type   ROS: All systems reviewed and negative except as per HPI.   Current Outpatient Prescriptions  Medication Sig Dispense Refill  . ALPRAZolam (XANAX) 0.5 MG tablet Take 1 tablet (0.5 mg total) by mouth at bedtime as needed for anxiety. 20 tablet 0  . diphenoxylate-atropine (LOMOTIL) 2.5-0.025 MG tablet Take 1-2 tablets by mouth 4 (four) times daily as needed for diarrhea or loose stools. 60 tablet 2  . docusate sodium (COLACE) 50 MG capsule Take 50 mg by mouth 2 (two) times daily.    Marland Kitchen gabapentin (NEURONTIN) 100 MG capsule Take 1 capsule (100 mg total) by mouth 3 (three) times daily. 90 capsule 0  . ibuprofen (  ADVIL,MOTRIN) 200 MG tablet Take 800 mg by mouth every 6 (six) hours as needed for headache, mild pain or moderate pain.    Marland Kitchen lidocaine-prilocaine (EMLA) cream Apply 1 application topically as needed. 30 g 1  . loperamide (IMODIUM) 2 MG capsule Take 2 mg by mouth as needed for diarrhea or loose stools.    . magic mouthwash SOLN Take 5 mLs by mouth 4 (four) times daily as needed for mouth pain. 240 mL 0  . nystatin cream (MYCOSTATIN) Apply 1 application topically 2 (two) times daily. 30 g 0  . ondansetron (ZOFRAN) 8 MG tablet Take 1 tablet (8 mg total) by mouth 2 (two) times daily as needed for nausea or vomiting. 20 tablet 1  . prochlorperazine (COMPAZINE) 10 MG tablet Take 1 tablet (10 mg total) by mouth every 6 (six) hours as needed for nausea or vomiting. 30 tablet 1  . zolpidem (AMBIEN) 5 MG tablet Take 1 tablet (5 mg total) by mouth at bedtime as  needed for sleep. 30 tablet 1  . oxyCODONE-acetaminophen (PERCOCET/ROXICET) 5-325 MG tablet Take 1 tablet by mouth every 8 (eight) hours as needed for severe pain. (Patient not taking: Reported on 02/10/2017) 30 tablet 0   No current facility-administered medications for this encounter.    BP 123/69   Pulse 73   Wt 185 lb 12 oz (84.3 kg)   SpO2 100%   BMI 35.68 kg/m  General: NAD Neck: No JVD, no thyromegaly or thyroid nodule.  Lungs: Clear to auscultation bilaterally with normal respiratory effort. CV: Nondisplaced PMI.  Heart regular S1/S2, no S3/S4, 1/6 early SEM RUSB.  No peripheral edema.  No carotid bruit.  Normal pedal pulses.  Abdomen: Soft, nontender, no hepatosplenomegaly, no distention.  Skin: Intact without lesions or rashes.  Neurologic: Alert and oriented x 3.  Psych: Normal affect. Extremities: No clubbing or cyanosis.  HEENT: Normal.   Assessment/Plan: 1. Breast cancer: Patient had Adriamycin-based chemotherapy.  We discussed the cardiac risk from Adriamycin.  I reviewed her echo (she has completed Adriamycin).  It showed normal LV systolic function.  Strain was not done.  I will have her get an echo again in 6 months to look for any late effects from Adriamycin.  2. Abnormal aortic valve: Soft murmur, mild turbulence across the aortic valve on echo though the valve is poorly visualized.  No stenosis or regurgitation.  She will have another echo in 6 months, will see if we can get a better view of the valve at that time.    Loralie Champagne 02/11/2017

## 2017-02-14 ENCOUNTER — Ambulatory Visit: Payer: 59

## 2017-02-14 ENCOUNTER — Other Ambulatory Visit (HOSPITAL_BASED_OUTPATIENT_CLINIC_OR_DEPARTMENT_OTHER): Payer: 59

## 2017-02-14 ENCOUNTER — Encounter: Payer: Self-pay | Admitting: Hematology

## 2017-02-14 ENCOUNTER — Ambulatory Visit (HOSPITAL_BASED_OUTPATIENT_CLINIC_OR_DEPARTMENT_OTHER): Payer: 59 | Admitting: Hematology

## 2017-02-14 ENCOUNTER — Telehealth: Payer: Self-pay | Admitting: *Deleted

## 2017-02-14 VITALS — BP 114/77 | HR 75 | Temp 98.5°F | Ht 60.5 in | Wt 174.3 lb

## 2017-02-14 DIAGNOSIS — Z170 Estrogen receptor positive status [ER+]: Principal | ICD-10-CM

## 2017-02-14 DIAGNOSIS — C50511 Malignant neoplasm of lower-outer quadrant of right female breast: Secondary | ICD-10-CM | POA: Diagnosis not present

## 2017-02-14 DIAGNOSIS — Z95828 Presence of other vascular implants and grafts: Secondary | ICD-10-CM

## 2017-02-14 DIAGNOSIS — G4700 Insomnia, unspecified: Secondary | ICD-10-CM | POA: Diagnosis not present

## 2017-02-14 DIAGNOSIS — C773 Secondary and unspecified malignant neoplasm of axilla and upper limb lymph nodes: Secondary | ICD-10-CM | POA: Diagnosis not present

## 2017-02-14 DIAGNOSIS — F419 Anxiety disorder, unspecified: Secondary | ICD-10-CM

## 2017-02-14 LAB — CBC WITH DIFFERENTIAL/PLATELET
BASO%: 0.2 % (ref 0.0–2.0)
BASOS ABS: 0 10*3/uL (ref 0.0–0.1)
EOS ABS: 0 10*3/uL (ref 0.0–0.5)
EOS%: 0.4 % (ref 0.0–7.0)
HEMATOCRIT: 37.5 % (ref 34.8–46.6)
HEMOGLOBIN: 12.5 g/dL (ref 11.6–15.9)
LYMPH#: 1.4 10*3/uL (ref 0.9–3.3)
LYMPH%: 25.8 % (ref 14.0–49.7)
MCH: 31.3 pg (ref 25.1–34.0)
MCHC: 33.3 g/dL (ref 31.5–36.0)
MCV: 94 fL (ref 79.5–101.0)
MONO#: 0.4 10*3/uL (ref 0.1–0.9)
MONO%: 6.9 % (ref 0.0–14.0)
NEUT#: 3.7 10*3/uL (ref 1.5–6.5)
NEUT%: 66.7 % (ref 38.4–76.8)
Platelets: 240 10*3/uL (ref 145–400)
RBC: 3.99 10*6/uL (ref 3.70–5.45)
RDW: 14.1 % (ref 11.2–14.5)
WBC: 5.5 10*3/uL (ref 3.9–10.3)

## 2017-02-14 LAB — COMPREHENSIVE METABOLIC PANEL
ALBUMIN: 4 g/dL (ref 3.5–5.0)
ALK PHOS: 74 U/L (ref 40–150)
ALT: 31 U/L (ref 0–55)
ANION GAP: 11 meq/L (ref 3–11)
AST: 20 U/L (ref 5–34)
BUN: 14.7 mg/dL (ref 7.0–26.0)
CALCIUM: 9.5 mg/dL (ref 8.4–10.4)
CHLORIDE: 105 meq/L (ref 98–109)
CO2: 22 mEq/L (ref 22–29)
Creatinine: 0.8 mg/dL (ref 0.6–1.1)
Glucose: 170 mg/dl — ABNORMAL HIGH (ref 70–140)
POTASSIUM: 4.2 meq/L (ref 3.5–5.1)
Sodium: 138 mEq/L (ref 136–145)
Total Bilirubin: <0.22 mg/dL (ref 0.20–1.20)
Total Protein: 7.6 g/dL (ref 6.4–8.3)

## 2017-02-14 MED ORDER — TAMOXIFEN CITRATE 20 MG PO TABS: 20.0000 mg | ORAL_TABLET | Freq: Every day | ORAL | 1 refills | Status: DC

## 2017-02-14 MED ORDER — HEPARIN SOD (PORK) LOCK FLUSH 100 UNIT/ML IV SOLN
500.0000 [IU] | Freq: Once | INTRAVENOUS | Status: AC | PRN
  Administered 2017-02-14: 500 [IU] via INTRAVENOUS
  Filled 2017-02-14: qty 5

## 2017-02-14 MED ORDER — ALTEPLASE 2 MG IJ SOLR
2.0000 mg | Freq: Once | INTRAMUSCULAR | Status: DC | PRN
  Filled 2017-02-14: qty 2

## 2017-02-14 MED ORDER — SODIUM CHLORIDE 0.9% FLUSH
10.0000 mL | INTRAVENOUS | Status: DC | PRN
  Administered 2017-02-14: 10 mL via INTRAVENOUS
  Filled 2017-02-14: qty 10

## 2017-02-14 MED ORDER — HEPARIN SOD (PORK) LOCK FLUSH 100 UNIT/ML IV SOLN
500.0000 [IU] | Freq: Once | INTRAVENOUS | Status: DC | PRN
  Filled 2017-02-14: qty 5

## 2017-02-14 NOTE — Patient Instructions (Signed)
Implanted Port Home Guide An implanted port is a type of central line that is placed under the skin. Central lines are used to provide IV access when treatment or nutrition needs to be given through a person's veins. Implanted ports are used for long-term IV access. An implanted port may be placed because:  You need IV medicine that would be irritating to the small veins in your hands or arms.  You need long-term IV medicines, such as antibiotics.  You need IV nutrition for a long period.  You need frequent blood draws for lab tests.  You need dialysis.  Implanted ports are usually placed in the chest area, but they can also be placed in the upper arm, the abdomen, or the leg. An implanted port has two main parts:  Reservoir. The reservoir is round and will appear as a small, raised area under your skin. The reservoir is the part where a needle is inserted to give medicines or draw blood.  Catheter. The catheter is a thin, flexible tube that extends from the reservoir. The catheter is placed into a large vein. Medicine that is inserted into the reservoir goes into the catheter and then into the vein.  How will I care for my incision site? Do not get the incision site wet. Bathe or shower as directed by your health care provider. How is my port accessed? Special steps must be taken to access the port:  Before the port is accessed, a numbing cream can be placed on the skin. This helps numb the skin over the port site.  Your health care provider uses a sterile technique to access the port. ? Your health care provider must put on a mask and sterile gloves. ? The skin over your port is cleaned carefully with an antiseptic and allowed to dry. ? The port is gently pinched between sterile gloves, and a needle is inserted into the port.  Only "non-coring" port needles should be used to access the port. Once the port is accessed, a blood return should be checked. This helps ensure that the port  is in the vein and is not clogged.  If your port needs to remain accessed for a constant infusion, a clear (transparent) bandage will be placed over the needle site. The bandage and needle will need to be changed every week, or as directed by your health care provider.  Keep the bandage covering the needle clean and dry. Do not get it wet. Follow your health care provider's instructions on how to take a shower or bath while the port is accessed.  If your port does not need to stay accessed, no bandage is needed over the port.  What is flushing? Flushing helps keep the port from getting clogged. Follow your health care provider's instructions on how and when to flush the port. Ports are usually flushed with saline solution or a medicine called heparin. The need for flushing will depend on how the port is used.  If the port is used for intermittent medicines or blood draws, the port will need to be flushed: ? After medicines have been given. ? After blood has been drawn. ? As part of routine maintenance.  If a constant infusion is running, the port may not need to be flushed.  How long will my port stay implanted? The port can stay in for as long as your health care provider thinks it is needed. When it is time for the port to come out, surgery will be   done to remove it. The procedure is similar to the one performed when the port was put in. When should I seek immediate medical care? When you have an implanted port, you should seek immediate medical care if:  You notice a bad smell coming from the incision site.  You have swelling, redness, or drainage at the incision site.  You have more swelling or pain at the port site or the surrounding area.  You have a fever that is not controlled with medicine.  This information is not intended to replace advice given to you by your health care provider. Make sure you discuss any questions you have with your health care provider. Document  Released: 11/11/2005 Document Revised: 04/18/2016 Document Reviewed: 07/19/2013 [employer] Interactive Patient Education  2017 [employer]  

## 2017-02-14 NOTE — Telephone Encounter (Signed)
Spoke with patient to inform her of an appointment with Dr. Iran Planas for 02/20/17 at [facility].

## 2017-02-17 ENCOUNTER — Telehealth: Payer: Self-pay | Admitting: Hematology

## 2017-02-19 ENCOUNTER — Telehealth: Payer: Self-pay | Admitting: *Deleted

## 2017-02-19 NOTE — Telephone Encounter (Signed)
Left message for a return phone call to get MRI breat scheduled.  Central Scheduling has also left her a message.

## 2017-02-21 ENCOUNTER — Telehealth: Payer: Self-pay | Admitting: *Deleted

## 2017-02-21 NOTE — Telephone Encounter (Signed)
Called pt this am to discuss whether she is still taking oxycodone.  Pt called back & states that she still takes for back & joint pain when she needs it & would like to get authorized with her insurance. Message to [facility].

## 2017-02-22 ENCOUNTER — Ambulatory Visit (HOSPITAL_COMMUNITY)
Admission: RE | Admit: 2017-02-22 | Discharge: 2017-02-22 | Disposition: A | Discharge status: Home / Self Care | Payer: 59 | Source: Ambulatory Visit | Attending: Hematology | Admitting: Hematology

## 2017-02-22 DIAGNOSIS — Z170 Estrogen receptor positive status [ER+]: Secondary | ICD-10-CM | POA: Insufficient documentation

## 2017-02-22 DIAGNOSIS — C50511 Malignant neoplasm of lower-outer quadrant of right female breast: Secondary | ICD-10-CM | POA: Diagnosis not present

## 2017-02-22 MED ORDER — GADOBENATE DIMEGLUMINE 529 MG/ML IV SOLN
20.0000 mL | Freq: Once | INTRAVENOUS | Status: AC | PRN
  Administered 2017-02-22: 16 mL via INTRAVENOUS

## 2017-02-26 ENCOUNTER — Telehealth: Payer: Self-pay | Admitting: Hematology

## 2017-02-26 NOTE — Telephone Encounter (Signed)
Called patient about her 4/13 appointment and they VM was full and I could not leave a message

## 2017-02-28 ENCOUNTER — Telehealth: Payer: Self-pay | Admitting: Hematology

## 2017-02-28 NOTE — Telephone Encounter (Signed)
Left message for patient re 4/13 lab/fu. Schedule mailed - patient my chart active.

## 2017-03-07 ENCOUNTER — Other Ambulatory Visit: Payer: Self-pay | Admitting: *Deleted

## 2017-03-07 ENCOUNTER — Ambulatory Visit (HOSPITAL_BASED_OUTPATIENT_CLINIC_OR_DEPARTMENT_OTHER): Payer: 59 | Admitting: Hematology

## 2017-03-07 ENCOUNTER — Other Ambulatory Visit (HOSPITAL_BASED_OUTPATIENT_CLINIC_OR_DEPARTMENT_OTHER): Payer: 59

## 2017-03-07 ENCOUNTER — Encounter: Payer: Self-pay | Admitting: Hematology

## 2017-03-07 VITALS — BP 124/74 | HR 87 | Temp 98.5°F | Resp 18 | Ht 60.0 in | Wt 183.4 lb

## 2017-03-07 DIAGNOSIS — Z170 Estrogen receptor positive status [ER+]: Principal | ICD-10-CM

## 2017-03-07 DIAGNOSIS — F419 Anxiety disorder, unspecified: Secondary | ICD-10-CM | POA: Diagnosis not present

## 2017-03-07 DIAGNOSIS — C50511 Malignant neoplasm of lower-outer quadrant of right female breast: Secondary | ICD-10-CM

## 2017-03-07 DIAGNOSIS — G4700 Insomnia, unspecified: Secondary | ICD-10-CM | POA: Diagnosis not present

## 2017-03-07 LAB — COMPREHENSIVE METABOLIC PANEL
ALT: 23 U/L (ref 0–55)
AST: 21 U/L (ref 5–34)
Albumin: 4.1 g/dL (ref 3.5–5.0)
Alkaline Phosphatase: 73 U/L (ref 40–150)
Anion Gap: 8 mEq/L (ref 3–11)
BUN: 16.8 mg/dL (ref 7.0–26.0)
CHLORIDE: 103 meq/L (ref 98–109)
CO2: 29 meq/L (ref 22–29)
Calcium: 9.8 mg/dL (ref 8.4–10.4)
Creatinine: 0.7 mg/dL (ref 0.6–1.1)
Glucose: 116 mg/dl (ref 70–140)
POTASSIUM: 3.8 meq/L (ref 3.5–5.1)
Sodium: 140 mEq/L (ref 136–145)
TOTAL PROTEIN: 7.7 g/dL (ref 6.4–8.3)
Total Bilirubin: <0.22 mg/dL (ref 0.20–1.20)

## 2017-03-07 LAB — CBC WITH DIFFERENTIAL/PLATELET
BASO%: 0.3 % (ref 0.0–2.0)
BASOS ABS: 0 10*3/uL (ref 0.0–0.1)
EOS ABS: 0.1 10*3/uL (ref 0.0–0.5)
EOS%: 1.8 % (ref 0.0–7.0)
HCT: 39.4 % (ref 34.8–46.6)
HGB: 13.3 g/dL (ref 11.6–15.9)
LYMPH%: 23.9 % (ref 14.0–49.7)
MCH: 31.2 pg (ref 25.1–34.0)
MCHC: 33.7 g/dL (ref 31.5–36.0)
MCV: 92.5 fL (ref 79.5–101.0)
MONO#: 0.6 10*3/uL (ref 0.1–0.9)
MONO%: 9.1 % (ref 0.0–14.0)
NEUT%: 64.9 % (ref 38.4–76.8)
NEUTROS ABS: 4.1 10*3/uL (ref 1.5–6.5)
Platelets: 246 10*3/uL (ref 145–400)
RBC: 4.26 10*6/uL (ref 3.70–5.45)
RDW: 14.9 % — ABNORMAL HIGH (ref 11.2–14.5)
WBC: 6.3 10*3/uL (ref 3.9–10.3)
lymph#: 1.5 10*3/uL (ref 0.9–3.3)

## 2017-03-07 MED ORDER — ALPRAZOLAM 0.5 MG PO TABS: 0.5000 mg | ORAL_TABLET | Freq: Every evening | ORAL | 0 refills | Status: DC | PRN

## 2017-03-07 MED ORDER — GABAPENTIN 100 MG PO CAPS: 100.0000 mg | ORAL_CAPSULE | Freq: Three times a day (TID) | ORAL | 2 refills | Status: DC

## 2017-03-07 NOTE — Progress Notes (Signed)
[place]  [phone] [phone]) [phone]  Clinic Follow up Note   Patient Care Team: No Pcp Per Patient as PCP - General (General Practice) Rolm Bookbinder, MD as Consulting Physician (General Surgery) Truitt Merle, MD as Consulting Physician (Hematology) Kyung Rudd, MD as Consulting Physician (Radiation Oncology) 03/07/2017  CHIEF COMPLAINTS:  Follow up right breast cancer  Oncology History   Malignant neoplasm of lower-outer quadrant of right female breast [facility])   Staging form: Breast, AJCC 7th Edition   - Clinical stage from 09/12/2016: Stage IIB (T2, N1, M0) - Signed by Truitt Merle, MD on 09/18/2016      Malignant neoplasm of lower-outer quadrant of right female breast ([place])   09/10/2016 Mammogram    Mammogram and ultrasound showed a 2.7 cm mass at 8 clock position of the right breast, there is also a lobulated lymph nodes in the right axilla, slightly suspicious for malignancy.       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 Docetaxel 37m/m2 on 01/24/17, was to be given every 3 weeks but stopped due to worsening neuropathy (only 1 cycle given)      11/20/2016 Genetic Testing    Negative genetic testing on the Breast/Ovarian cancer panel.  Negative genetic testing for the MSH2 inversion analysis ([place] inversion). The Breast/Ovarian  gene panel offered by [facility] includes sequencing and rearrangement analysis for the following 20 genes:  ATM, BARD1, BRCA1, BRCA2, BRIP1, CDH1, CHEK2, EPCAM, FANCC, MLH1, MSH2, MSH6, NBN, PALB2, PMS2, PTEN, RAD51C, RAD51D, TP53, and XRCC2.   The report date is December [facility] and November 20, 2016 for the [facility].      02/14/2017 -  Anti-estrogen oral therapy    Tamoxifen 20 mg daily starting 02/14/17      02/22/2017 Breast MRI    IMPRESSION: 1. Significantly smaller mass within the lower outer quadrant of the right breast, now measuring 1.5 cm (previously 2.7 cm). 2. Smaller right axillary lymph nodes.        HISTORY OF PRESENTING ILLNESS:  April VELOSO51y.o. female is here because of her recently diagnosed right breast cancer. She is accompanied by her boyfriend to our multidisciplinary breast clinic today.   She noticed a right breast lump 3 monthsa ago, no pain or tenderness, no skin change or nipple discharge. She feels the lump has been getting bigger over the past 3 months. She called her gynecologist and was finally seen a few weeks ago. She was referred to have a diagnostic mammogram and ultrasound which showed a 2.7 cm mass at the 8:00 position of the right breast, and also a lobulated lymph nodes in the right axilla. 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  She feels well,  denies any skin pain, or other symptoms. No recent weight loss. 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 Her husband died from small cell cancer several years ago, she has 5 daughters. She recently found out that 3 of her paternal cousins had breast cancer.  GYN HISTORY  Menarchal: 17 LMP: regular  Contraceptive: no  HRT: n/a  G5P5: 5 daughters 30-19  CURRENT THERAPY: Tamoxifen 20 mg daily  started 02/14/17  INTERIM HISTORY: April Orozco returns for follow-up. The patient is looking forward to surgery and seeing her diagnosis placed behind her. She saw Dr. Harlow Mares on 02/28/17. The patient still has residual side effects from chemotherapy; such as tingling and numbness of her hands and feet, swelling of her hands and feet, and fatigue. She drops objects and has difficulty picking up objects. The patient is on Tamoxifen She has hot flashes and night sweats, but they are not as bad as when she was on chemo. Now she is up to the full dose of neurontin with 273m at night and 1 in the morning. She is taking Xanax and aleve to help her sleep, they help her more than the Ambien.  MEDICAL HISTORY:  Past Medical History:  Diagnosis Date  . Fracture, sacrum/coccyx ([facility]   "Shattered tailbone"  . Heart murmur    SE Cardiovascular has evaluated, felt benign  . Malignant neoplasm of lower-outer quadrant of right female breast ([facility] 09/13/2016  . Tachycardia    Intermittent, resolved with decreased caffeine intake    SURGICAL HISTORY: Past Surgical History:  Procedure Laterality Date  . APPENDECTOMY    . IR GENERIC HISTORICAL  01/10/2017   IR CV LINE INJECTION 01/10/2017 MGreggory Keen MD [facility]-INTERV RAD  . PORTACATH PLACEMENT Right 09/23/2016   Procedure: INSERTION PORT-A-CATH WITH [place]  Surgeon: MRolm Bookbinder MD;  [facility]  Service: General;  Laterality: Right;  . TONSILECTOMY/ADENOIDECTOMY WITH MYRINGOTOMY    . TUBAL LIGATION      SOCIAL HISTORY: Social History   Social History  . Marital status: Widowed    Spouse name: N/A  . Number of children: 5  . Years of education: N/A   Occupational History  . 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 Smoking status: Never Smoker  . Smokeless tobacco: Never Used  . Alcohol use Yes     Comment: social drinker   . Drug use: No  . Sexual activity: Not on file   Other Topics  Concern  . Not on file   Social History Narrative  . No narrative on file    FAMILY HISTORY: Family History  Problem Relation Age of Onset  . Diabetes Mother   . Diabetes Father   . Skin cancer Father 717   NOS type; worked as a rTheme park managerfor many years  . Hypertension Other   . Breast cancer Cousin 458   paternal 1st cousin; w/ mets to bone  . Breast cancer Cousin     paternal 1st cousin dx 462-50 s/p lump  . Breast cancer Cousin     paternal 1st cousin dx 524-53w/ mets to LN; s/p BL mastectomies  . Heart disease Maternal Aunt 58  . Colon cancer Paternal Uncle     dx 67-68  . Goiter Maternal Grandmother     d. bled to death following goiter surgery  . Heart attack Maternal Grandfather  d. early 30s  . Lung cancer Paternal Grandmother     dx 67s; d. 88y  . Diabetes Paternal Grandmother   . Heart failure Paternal Grandfather     d. 75-76  . Alzheimer's disease Paternal Grandfather   . Other Daughter 21    daughter w/ hx benign breast lump/cyst removed  . Epilepsy Maternal Aunt   . Alzheimer's disease Paternal Aunt   . Brain cancer Cousin 43    paternal 1st cousin; d. 41y; NOS type    ALLERGIES:  is allergic to banana; sulfur; and tramadol.  MEDICATIONS:  Current Outpatient Prescriptions  Medication Sig Dispense Refill  . bimatoprost (LATISSE) 0.03 % ophthalmic solution Place 1 application into both eyes daily. Place one drop on applicator and apply evenly along the skin of the upper eyelid at base of eyelashes once daily at bedtime; repeat procedure for second eye (use a clean applicator).    . ALPRAZolam (XANAX) 0.5 MG tablet Take 1 tablet (0.5 mg total) by mouth at bedtime as needed for anxiety. 30 tablet 0  . diphenoxylate-atropine (LOMOTIL) 2.5-0.025 MG tablet Take 1-2 tablets by mouth 4 (four) times daily as needed for diarrhea or loose stools. 60 tablet 2  . docusate sodium (COLACE) 50 MG capsule Take 50 mg by mouth 2 (two) times daily.    Marland Kitchen gabapentin  (NEURONTIN) 100 MG capsule Take 1 capsule (100 mg total) by mouth 3 (three) times daily. 90 capsule 2  . ibuprofen (ADVIL,MOTRIN) 200 MG tablet Take 800 mg by mouth every 6 (six) hours as needed for headache, mild pain or moderate pain.    Marland Kitchen lidocaine-prilocaine (EMLA) cream Apply 1 application topically as needed. 30 g 1  . loperamide (IMODIUM) 2 MG capsule Take 2 mg by mouth as needed for diarrhea or loose stools.    . magic mouthwash SOLN Take 5 mLs by mouth 4 (four) times daily as needed for mouth pain. 240 mL 0  . nystatin cream (MYCOSTATIN) Apply 1 application topically 2 (two) times daily. 30 g 0  . ondansetron (ZOFRAN) 8 MG tablet Take 1 tablet (8 mg total) by mouth 2 (two) times daily as needed for nausea or vomiting. 20 tablet 1  . oxyCODONE-acetaminophen (PERCOCET/ROXICET) 5-325 MG tablet Take 1 tablet by mouth every 8 (eight) hours as needed for severe pain. (Patient not taking: Reported on 02/10/2017) 30 tablet 0  . prochlorperazine (COMPAZINE) 10 MG tablet Take 1 tablet (10 mg total) by mouth every 6 (six) hours as needed for nausea or vomiting. 30 tablet 1  . tamoxifen (NOLVADEX) 20 MG tablet Take 1 tablet (20 mg total) by mouth daily. 30 tablet 1  . zolpidem (AMBIEN) 5 MG tablet Take 1 tablet (5 mg total) by mouth at bedtime as needed for sleep. 30 tablet 1   No current facility-administered medications for this visit.     REVIEW OF SYSTEMS: Constitutional: Denies fevers, chills (+) night sweats, fatigue, hot flashes Eyes: Denies blurriness of vision, double vision or watery eyes Ears, nose, mouth, throat, and face: Denies mucositis or sore throat Respiratory: Denies dyspnea or wheezes Cardiovascular: Denies palpitation, chest discomfort or lower extremity swelling Gastrointestinal:  Denies nausea, heartburn,  Skin: Denies abnormal skin rashes Lymphatics: Denies new lymphadenopathy or easy bruising (+) swelling in hands and feet Neurological:Denies new weaknesses (+) tingling  and numbness in hands and feet Behavioral/Psych: Mood is stable, no new changes (+) depression All other systems were reviewed with the patient and are negative.  PHYSICAL EXAMINATION: ECOG  PERFORMANCE STATUS: 1  Vitals:   03/07/17 1421  BP: 124/74  Pulse: 87  Resp: 18  Temp: 98.5 F (36.9 C)   Filed Weights   03/07/17 1421  Weight: 183 lb 6.4 oz (83.2 kg)   GENERAL:alert, no distress and comfortable (+) alopecia  SKIN: skin color, texture, turgor are normal, no rashes or significant lesions EYES: normal, conjunctiva are pink and non-injected, sclera clear OROPHARYNX:no exudate, no erythema and lips, buccal mucosa, and tongue normal  NECK: supple, thyroid normal size, non-tender, without nodularity LYMPH:  no palpable lymphadenopathy in the cervical, axillary or inguinal LUNGS: clear to auscultation and percussion with normal breathing effort HEART: regular rate & rhythm and no murmurs and no lower extremity edema ABDOMEN:abdomen soft, non-tender and normal bowel sounds Musculoskeletal:no cyanosis of digits and no clubbing  PSYCH: alert & oriented x 3 with fluent speech NEURO: (+) Moderately decreased vibration sensation in the extremities. Most prominent on the right upper extremity. Breasts: Breast inspection showed them to be symmetrical with no nipple discharge. Palpation of the breasts and axilla revealed a 1 cm mass at 8:00 position close to right areola, smaller than before, moveable, no other obvious mass that I could appreciate.   LABORATORY DATA:  I have reviewed the data as listed CBC Latest Ref Rng & Units 03/07/2017 02/14/2017 01/24/2017  WBC 3.9 - 10.3 10e3/uL 6.3 5.5 4.8  Hemoglobin 11.6 - 15.9 g/dL 13.3 12.5 11.9  Hematocrit 34.8 - 46.6 % 39.4 37.5 35.1  Platelets 145 - 400 10e3/uL 246 240 236   CMP Latest Ref Rng & Units 03/07/2017 02/14/2017 01/24/2017  Glucose 70 - 140 mg/dl 116 170(H) 104  BUN 7.0 - 26.0 mg/dL 16.8 14.7 11.0  Creatinine 0.6 - 1.1 mg/dL 0.7 0.8  0.7  Sodium 136 - 145 mEq/L 140 138 140  Potassium 3.5 - 5.1 mEq/L 3.8 4.2 4.4  Chloride 101 - 111 mmol/L - - -  CO2 22 - 29 mEq/L _0 Calcium 8.4 - 10.4 mg/dL 9.8 9.5 9.6  Total Protein 6.4 - 8.3 g/dL 7.7 7.6 7.5  Total Bilirubin 0.20 - 1.20 mg/dL <0.22 <0.22 0.28  Alkaline Phos 40 - 150 U/L 73 74 72  AST 5 - 34 U/L _1 ALT 0 - 55 U/L _2 PATHOLOGY REPORTS: Diagnosis 09/12/2016 1. Lymph node, needle/core biopsy, right axilla - METASTATIC CARCINOMA WITH EXTRACAPSULAR EXTENSION. 2. Breast, right, needle core biopsy, 8 o'clock - INVASIVE DUCTAL CARCINOMA, GRADE 3.   RADIOGRAPHIC STUDIES: I have personally reviewed the radiological images as listed and agreed with the findings in the report. Mr Breast Bilateral W Wo Contrast  Addendum Date: 03/07/2017   ADDENDUM REPORT: 03/07/2017 08:28 ADDENDUM:  CORRECTED REPORT: ADDENDUM:  CORRECTED REPORT Right breast: Within the lower outer quadrant, residual enhancing irregular mass with irregular margins is identified. 1.5 x 1.5 x 1.5 cm. Previously mass measured 2.7 x 2.4 x 2.5 cm. Mass demonstrates rapid wash- in and washout type enhancement kinetics thick and heterogeneous internal enhancement characteristics. Left breast: No mass or abnormal enhancement. Electronically Signed   By: Nolon Nations M.D.   On: 03/07/2017 08:28   Result Date: 03/07/2017 CLINICAL DATA:  Evaluate response to chemotherapy. Known right breast cancer. Patient is undergoing neoadjuvant treatment. LABS:  Not applicable EXAM: BILATERAL BREAST MRI WITH AND WITHOUT CONTRAST TECHNIQUE: Multiplanar, multisequence MR images of both breasts were obtained prior to and following the intravenous administration of 16 ml of MultiHance. THREE-DIMENSIONAL  MR IMAGE RENDERING ON INDEPENDENT WORKSTATION: Three-dimensional MR images were rendered by post-processing of the original MR data on an independent workstation. The three-dimensional MR images were interpreted,  and findings are reported in the following complete MRI report for this study. Three dimensional images were evaluated at the independent DynaCad workstation COMPARISON:  MRI 09/22/2016 FINDINGS: Breast composition: c. Heterogeneous fibroglandular tissue. Background parenchymal enhancement: Minimal Right breast: Within the lower outer quadrant, residual enhancing irregular mass with irregular margins is identified. 1.5 x 1.5 x 1.5 cm. Left breast: No mass or abnormal enhancement. Previously mass measured 2.7 x 2.4 x 2.5 cm. Mass demonstrates rapid wash- in and washout type enhancement kinetics thick and heterogeneous internal enhancement characteristics. Lymph nodes: Abnormal right axillary lymph nodes identified on the previous exam are no longer present. Normal appearing right axillary lymph nodes are associated with a tissue marker clip from previous biopsy in the right axilla. Internal mammary lymph nodes and axillary nodes bilaterally have normal morphology. Ancillary findings:  None. IMPRESSION: 1. Significantly smaller mass within the lower outer quadrant of the right breast, now measuring 1.5 cm (previously 2.7 cm). 2. Smaller right axillary lymph nodes. RECOMMENDATION: Treatment plan BI-RADS CATEGORY  6: Known biopsy-proven malignancy. Electronically Signed: By: Nolon Nations M.D. On: 02/24/2017 10:41     CT Angio Chest 10/05/2016 IMPRESSION: No evidence of significant pulmonary embolus. No evidence of active pulmonary disease. No evidence of metastasis in the chest.  ECHOCARDIOGRAM 02/10/17 Impressions: - Normal LV size with EF 60-65%. Normal diastolic function. Normal   RV size and systolic function. Aortic valve poorly visualized,   mild turbulence across it but no stenosis.  ASSESSMENT & PLAN:  51 y.o. premenopausal Caucasian female, presented with a palpable right breast mass.  1. 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 -Due to the clinically rapid growing tumor, moderate size, positive lymph nodes, high-grade, high Ki-67, this is likely a aggressive tumor, the risk of recurrence after surgery is likely high. -I previously recommended her to consider neoadjuvant chemotherapy, to shrink the tumor, make the lumpectomy more feasible, and potentially avoid axillary lymph node dissection. 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 -She has decided to have double mastectomy, despite negative genetics. she is extremely concerned about the risk of future breast cancer, I will let Dr. Donne Hazel know. -Due to her worsening neuropathy after 3 cycles of Taxol, I did not believe she can tolerate 12 cycles of treatment with Taxol. She switched to Docetaxel every 3 weeks, for additional 2-3 cycles. The benefit and side effects were reviewed with her, especially neutropenia, anemia, need for blood transfusion, diarrhea, anorexia, and worsening neuropathy, she voiced good understanding, and agreesdto proceed. -After previous lengthy discussion, patient agreed to start docetaxel 60 mg/m2, on 01/24/17. The patient declined Neulasta, due to previous severe bone pain. -The patient had continued and worsening neuropathy from chemotherapy. 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 I recommend her to stop chemotherapy. She agreed. -The patient started Tamoxifen the week of  02/14/17 while she is awaiting for surgery. She is to stop before she has her surgery. -Although her genetic testing was negative, she prefers to bilateral mastectomy. The patient was referred to plastic surgeon Dr. Iran Planas for reconstruction after double mastectomy on 02/20/17. She also saw Dr. Harlow Mares on 02/28/17 to further discuss reconstruction and he will now be her reconstructive surgeon.Marland Kitchen She will be referred back to Dr. Donne Hazel to finalize her surgery the week of 03/10/17. -Bilateral breast MRI on 02/22/17 showed a residual enhancing mass measuring 1.5 x 1.5 x 1.5 cm in the LOQ of the right breast that previously measured 2.7 x 2.4 x 2.5 cm, no mass or abnormal enhancement of the left breast, and abnormal right axillary lymph nodes identified on the previous exam are no longer present. -After surgery, the patient will follow up with Dr. Lisbeth Renshaw for adjuvant radiation to the right chest wall and axilla. -I recommend her to stop Tamoxifen one week before surgery, and hold until she completes radiation   2. Genetics  -Her young age and strong family history of breast cancer, we'll refer her to his genetics for testing, to rule out inheritable genetic syndrome. -Negative genetic testing on the Breast/Ovarian cancer panel.  Negative genetic testing for the MSH2 inversion analysis ([place] inversion). The Breast/Ovarian gene panel offered by [facility] includes sequencing and rearrangement analysis for the following 20 genes:  ATM, BARD1, BRCA1, BRCA2, BRIP1, CDH1, CHEK2, EPCAM, FANCC, MLH1, MSH2, MSH6, NBN, PALB2, PMS2, PTEN, RAD51C, RAD51D, TP53, and XRCC2.   The report date is December 19 for the [facility] panel and November 20, 2016 for the [place] inversion.  3. Anxiety  and insomnia -She has been quite anxious since cancer diagnosis, but does not feel she needs medication -She has tried Ambien, do not like it  -continue lorazepam 0.5 mg as needed for insomnia and anxiety  4. Neuropathy, G2 -Chemotherapy  induced. -Numbness and tingling of her fingers and toes. -Previously prescribed Neurontin, I recommended her to increase from 100 mg to 28m at night, and gradually titrate the dose to 3047mtid if needed   5. Hot flash and night sweats -She has tried Effexor for a few months, and not feel helpful, she has stop it.  6. Diarrhea and abdominal cramps -Secondary to Taxol, worse after week 2 treatment -She will continue using Imodium and Lomotil as needed, improved lately.  7. Port issue  -she has had a difficulty on blood drawn from her port daily. -Dye study on 01/10/17 showed acute angulation of support the neck, I sent a message to Dr. WaDonne Hazelo see if he has any suggestions. Port is fine to use. -I suggest her to remove port during her breast surgery.  Plan  -F/u with Dr. WaDonne Hazelext week to further discuss surgery. She has decided to have bilateral mastectomy and reconstruction with implants  -port can be removed during her breast surgery  -Continue Tamoxifen and stop before she has her breast surgery. -continue Neurontin 200 mg at bedtime and 100 mg daytime. -I will call in [facility] [address]-F/u and lab after she completes radiation.  All questions were answered. The patient knows to call the clinic with any problems, questions or concerns.  I spent 25 minutes counseling the patient face to face. The total time spent in the appointment was 30 minutes and more than 50% was  on counseling.    Truitt Merle, MD 03/07/2017   This document serves as a record of services personally performed by Truitt Merle, MD. It was created on her behalf by Darcus Austin, a trained medical scribe. The creation of this record is based on the scribe's personal observations and the provider's statements to them. This document has been checked and approved by the attending provider.

## 2017-03-10 ENCOUNTER — Other Ambulatory Visit: Payer: Self-pay | Admitting: General Surgery

## 2017-03-10 ENCOUNTER — Telehealth: Payer: Self-pay | Admitting: Hematology

## 2017-03-10 DIAGNOSIS — C50511 Malignant neoplasm of lower-outer quadrant of right female breast: Secondary | ICD-10-CM

## 2017-03-10 NOTE — Telephone Encounter (Signed)
No LOS per 03/07/17 visit.

## 2017-03-12 ENCOUNTER — Other Ambulatory Visit: Payer: Self-pay | Admitting: General Surgery

## 2017-03-12 DIAGNOSIS — C50011 Malignant neoplasm of nipple and areola, right female breast: Secondary | ICD-10-CM

## 2017-03-12 DIAGNOSIS — C50511 Malignant neoplasm of lower-outer quadrant of right female breast: Secondary | ICD-10-CM

## 2017-03-13 ENCOUNTER — Encounter: Payer: Self-pay | Admitting: Radiation Oncology

## 2017-03-28 ENCOUNTER — Telehealth: Payer: Self-pay | Admitting: *Deleted

## 2017-03-28 NOTE — Telephone Encounter (Signed)
CALLED PATIENT TO [facility] APPT. PER ALISON PERKINS, RESCHEDULED FOR 05-08-17 - 1PM- NURSE AND 1:30 PM FOR ALISON PERKINS, PT. AGREED TO NEW TIME AND DATE

## 2017-03-31 ENCOUNTER — Inpatient Hospital Stay (HOSPITAL_COMMUNITY): Admission: RE | Admit: 2017-03-31 | Payer: 59 | Source: Ambulatory Visit

## 2017-03-31 ENCOUNTER — Ambulatory Visit (HOSPITAL_COMMUNITY): Payer: Self-pay | Admitting: Plastic Surgery

## 2017-04-01 ENCOUNTER — Encounter (HOSPITAL_COMMUNITY): Payer: Self-pay

## 2017-04-01 ENCOUNTER — Encounter (HOSPITAL_COMMUNITY)
Admission: RE | Admit: 2017-04-01 | Discharge: 2017-04-01 | Disposition: A | Discharge status: Home / Self Care | Payer: 59 | Source: Ambulatory Visit | Attending: General Surgery | Admitting: General Surgery

## 2017-04-01 DIAGNOSIS — C50911 Malignant neoplasm of unspecified site of right female breast: Secondary | ICD-10-CM | POA: Diagnosis not present

## 2017-04-01 DIAGNOSIS — Z01812 Encounter for preprocedural laboratory examination: Secondary | ICD-10-CM | POA: Diagnosis not present

## 2017-04-01 LAB — CBC
HCT: 41 % (ref 36.0–46.0)
HEMOGLOBIN: 13.8 g/dL (ref 12.0–15.0)
MCH: 30.6 pg (ref 26.0–34.0)
MCHC: 33.7 g/dL (ref 30.0–36.0)
MCV: 90.9 fL (ref 78.0–100.0)
PLATELETS: 219 10*3/uL (ref 150–400)
RBC: 4.51 MIL/uL (ref 3.87–5.11)
RDW: 13.1 % (ref 11.5–15.5)
WBC: 6.7 10*3/uL (ref 4.0–10.5)

## 2017-04-01 LAB — BASIC METABOLIC PANEL
ANION GAP: 9 (ref 5–15)
BUN: 10 mg/dL (ref 6–20)
CHLORIDE: 105 mmol/L (ref 101–111)
CO2: 24 mmol/L (ref 22–32)
CREATININE: 0.68 mg/dL (ref 0.44–1.00)
Calcium: 9.5 mg/dL (ref 8.9–10.3)
GFR calc non Af Amer: >60 mL/min (ref >60)
Glucose, Bld: 104 mg/dL — ABNORMAL HIGH (ref 65–99)
POTASSIUM: 3.8 mmol/L (ref 3.5–5.1)
SODIUM: 138 mmol/L (ref 135–145)

## 2017-04-01 MED ORDER — CHLORHEXIDINE GLUCONATE CLOTH 2 % EX PADS: 6.0000 | MEDICATED_PAD | Freq: Once | CUTANEOUS | Status: DC

## 2017-04-01 NOTE — Progress Notes (Signed)
PCP: pt denies  Cardiologist: Dr. Aundra Dubin  EKG: 09/2016 in EPIC  Stress test: denies ever  ECHO: 01/2017 in EPIC  Cardiac Cath: denies ever  Chest x-ray:09/2016 in EPIC

## 2017-04-01 NOTE — Pre-Procedure Instructions (Signed)
KEM PARCHER  04/01/2017      [id number] [place], [place] [id number] [address] [place] [address] Phone: [phone] Fax: [phone]  [id number] - [place], [place] [address] [place] [address] Phone: [phone] Fax: [phone]    Your procedure is scheduled on May 14 at 89 AM.  Report to [facility] Admitting at 530 AM.  Call this number if you have problems the morning of surgery:  [phone]   Remember:  Do not eat food or drink liquids after midnight.  Take these medicines the morning of surgery with A SIP OF WATER docusate sodium (colace), gabapentin (neurontin), loperamide (imodium), odansetron (zofran), prochlorperazine (compazine)-if needed, tamoxifen (nolvadex), diphenoxylate-atropine (lomotil).  7 days prior to surgery STOP taking any Aspirin, Aleve, Naproxen, Ibuprofen, Motrin, Advil, Goody's, BC's, all herbal medications, fish oil, and all vitamins   Do not wear jewelry, make-up or nail polish.  Do not wear lotions, powders, or perfumes, or deoderant.  Do not shave 48 hours prior to surgery.  Men may shave face and neck.  Do not bring valuables to the hospital.  [facility] is not responsible for any belongings or valuables.  Contacts, dentures or bridgework may not be worn into surgery.  Leave your suitcase in the car.  After surgery it may be brought to your room.  For patients admitted to the hospital, discharge time will be determined by your treatment team.  Patients discharged the day of surgery will not be allowed to drive home.    Special instructions:  [facility]- Preparing For Surgery  Before surgery, you can play an important role. Because skin is not sterile, your skin needs to be as free of germs as possible. You can reduce the number of germs on your skin by washing with CHG (chlorahexidine gluconate)  Soap before surgery.  CHG is an antiseptic cleaner which kills germs and bonds with the skin to continue killing germs even after washing.  Please do not use if you have an allergy to CHG or antibacterial soaps. If your skin becomes reddened/irritated stop using the CHG.  Do not shave (including legs and underarms) for at least 48 hours prior to first CHG shower. It is OK to shave your face.  Please follow these instructions carefully.   1. Shower the NIGHT BEFORE SURGERY and the MORNING OF SURGERY with CHG.   2. If you chose to wash your hair, wash your hair first as usual with your normal shampoo.  3. After you shampoo, rinse your hair and body thoroughly to remove the shampoo.  4. Use CHG as you would any other liquid soap. You can apply CHG directly to the skin and wash gently with a scrungie or a clean washcloth.   5. Apply the CHG Soap to your body ONLY FROM THE NECK DOWN.  Do not use on open wounds or open sores. Avoid contact with your eyes, ears, mouth and genitals (private parts). Wash genitals (private parts) with your normal soap.  6. Wash thoroughly, paying special attention to the area where your surgery will be performed.  7. Thoroughly rinse your body with warm water from the neck down.  8. DO NOT shower/wash with your normal soap after using and rinsing off the CHG Soap.  9. Pat yourself dry with a CLEAN TOWEL.   10. Wear CLEAN  PAJAMAS   11. Place CLEAN SHEETS on your bed the night of your first shower and DO NOT SLEEP WITH PETS.    Day of Surgery: Do not apply any deodorants/lotions. Please wear clean clothes to the hospital/surgery center.     Please read over the following fact sheets that you were given. Pain Booklet, Coughing and Deep Breathing and Surgical Site Infection Prevention

## 2017-04-03 ENCOUNTER — Other Ambulatory Visit: Payer: Self-pay | Admitting: *Deleted

## 2017-04-03 DIAGNOSIS — Z170 Estrogen receptor positive status [ER+]: Principal | ICD-10-CM

## 2017-04-03 DIAGNOSIS — C50511 Malignant neoplasm of lower-outer quadrant of right female breast: Secondary | ICD-10-CM

## 2017-04-04 ENCOUNTER — Other Ambulatory Visit: Payer: Self-pay | Admitting: *Deleted

## 2017-04-04 DIAGNOSIS — Z170 Estrogen receptor positive status [ER+]: Principal | ICD-10-CM

## 2017-04-04 DIAGNOSIS — C50511 Malignant neoplasm of lower-outer quadrant of right female breast: Secondary | ICD-10-CM

## 2017-04-04 MED ORDER — ALPRAZOLAM 0.5 MG PO TABS: 0.5000 mg | ORAL_TABLET | Freq: Every evening | ORAL | 0 refills | Status: DC | PRN

## 2017-04-04 NOTE — Telephone Encounter (Signed)
Pt reported that she has a problem with her  L leg when she puts weight on it.  It hurts in the hip & top thigh with a shooting-electrical pain.  She wants to know if this could be from the tamoxifen.  She reports no back problems & no falls or injuries. She if holding it for now for upcoming surgery.  Discussed with Dr Burr Medico & message left for pt to cont to hold Tamoxifen & we will see if that goes away & then we will know if it is the tamoxifen.  Suggested she call if symptoms worse.

## 2017-04-07 ENCOUNTER — Encounter (HOSPITAL_COMMUNITY)
Admission: RE | Admit: 2017-04-07 | Discharge: 2017-04-07 | Disposition: A | Discharge status: Home / Self Care | Payer: 59 | Source: Ambulatory Visit | Attending: General Surgery | Admitting: General Surgery

## 2017-04-07 ENCOUNTER — Observation Stay (HOSPITAL_COMMUNITY)
Admission: RE | Admit: 2017-04-07 | Discharge: 2017-04-09 | Disposition: A | Discharge status: Home / Self Care | Payer: 59 | Source: Ambulatory Visit | Attending: General Surgery | Admitting: General Surgery

## 2017-04-07 ENCOUNTER — Encounter (HOSPITAL_COMMUNITY)
Admission: RE | Disposition: A | Discharge status: Home / Self Care | Payer: Self-pay | Source: Ambulatory Visit | Attending: General Surgery

## 2017-04-07 ENCOUNTER — Ambulatory Visit (HOSPITAL_COMMUNITY): Payer: 59 | Admitting: Certified Registered"

## 2017-04-07 ENCOUNTER — Encounter (HOSPITAL_COMMUNITY): Payer: Self-pay | Admitting: *Deleted

## 2017-04-07 DIAGNOSIS — F419 Anxiety disorder, unspecified: Secondary | ICD-10-CM | POA: Diagnosis not present

## 2017-04-07 DIAGNOSIS — Z79899 Other long term (current) drug therapy: Secondary | ICD-10-CM | POA: Diagnosis not present

## 2017-04-07 DIAGNOSIS — C50411 Malignant neoplasm of upper-outer quadrant of right female breast: Secondary | ICD-10-CM | POA: Diagnosis present

## 2017-04-07 DIAGNOSIS — Z9221 Personal history of antineoplastic chemotherapy: Secondary | ICD-10-CM | POA: Insufficient documentation

## 2017-04-07 DIAGNOSIS — C50311 Malignant neoplasm of lower-inner quadrant of right female breast: Secondary | ICD-10-CM | POA: Diagnosis not present

## 2017-04-07 DIAGNOSIS — C773 Secondary and unspecified malignant neoplasm of axilla and upper limb lymph nodes: Secondary | ICD-10-CM | POA: Insufficient documentation

## 2017-04-07 DIAGNOSIS — Z170 Estrogen receptor positive status [ER+]: Secondary | ICD-10-CM | POA: Diagnosis not present

## 2017-04-07 DIAGNOSIS — Z9013 Acquired absence of bilateral breasts and nipples: Secondary | ICD-10-CM

## 2017-04-07 DIAGNOSIS — C50011 Malignant neoplasm of nipple and areola, right female breast: Secondary | ICD-10-CM

## 2017-04-07 HISTORY — PX: BREAST RECONSTRUCTION WITH PLACEMENT OF TISSUE EXPANDER AND FLEX HD (ACELLULAR HYDRATED DERMIS): SHX6295

## 2017-04-07 HISTORY — PX: PORT-A-CATH REMOVAL: SHX5289

## 2017-04-07 HISTORY — PX: MASTECTOMY WITH RADIOACTIVE SEED GUIDED EXCISION AND AXILLARY SENTINEL LYMPH NODE BIOPSY: SHX6736

## 2017-04-07 SURGERY — MASTECTOMY WITH RADIOACTIVE SEED GUIDED EXCISION AND AXILLARY SENTINEL LYMPH NODE BIOPSY
Anesthesia: General | Site: Breast

## 2017-04-07 MED ORDER — GABAPENTIN 100 MG PO CAPS
100.0000 mg | ORAL_CAPSULE | Freq: Three times a day (TID) | ORAL | Status: DC
  Administered 2017-04-07 – 2017-04-08 (×5): 100 mg via ORAL
  Filled 2017-04-07 (×5): qty 1

## 2017-04-07 MED ORDER — MORPHINE SULFATE (PF) 4 MG/ML IV SOLN
2.0000 mg | INTRAVENOUS | Status: DC | PRN
  Administered 2017-04-07 – 2017-04-09 (×9): 4 mg via INTRAVENOUS
  Filled 2017-04-07 (×9): qty 1

## 2017-04-07 MED ORDER — ONDANSETRON HCL 4 MG/2ML IJ SOLN
4.0000 mg | Freq: Four times a day (QID) | INTRAMUSCULAR | Status: DC | PRN
  Administered 2017-04-07 – 2017-04-09 (×2): 4 mg via INTRAVENOUS
  Filled 2017-04-07 (×2): qty 2

## 2017-04-07 MED ORDER — CEFAZOLIN SODIUM-DEXTROSE 1-4 GM/50ML-% IV SOLN: 1.0000 g | Freq: Four times a day (QID) | INTRAVENOUS | Status: DC

## 2017-04-07 MED ORDER — BUPIVACAINE HCL (PF) 0.25 % IJ SOLN
INTRAMUSCULAR | Status: AC
  Filled 2017-04-07: qty 60

## 2017-04-07 MED ORDER — HYDROMORPHONE HCL 1 MG/ML IJ SOLN
INTRAMUSCULAR | Status: AC
  Filled 2017-04-07: qty 1

## 2017-04-07 MED ORDER — METHYLENE BLUE 0.5 % INJ SOLN
INTRAVENOUS | Status: AC
  Filled 2017-04-07: qty 10

## 2017-04-07 MED ORDER — SODIUM CHLORIDE 0.9 % IR SOLN
Status: DC | PRN
  Administered 2017-04-07: 1000 mL

## 2017-04-07 MED ORDER — HEPARIN SODIUM (PORCINE) 5000 UNIT/ML IJ SOLN
5000.0000 [IU] | Freq: Once | INTRAMUSCULAR | Status: AC
  Administered 2017-04-07: 5000 [IU] via SUBCUTANEOUS
  Filled 2017-04-07: qty 1

## 2017-04-07 MED ORDER — NEOSTIGMINE METHYLSULFATE 5 MG/5ML IV SOSY
PREFILLED_SYRINGE | INTRAVENOUS | Status: DC | PRN
  Administered 2017-04-07: 4 mg via INTRAVENOUS

## 2017-04-07 MED ORDER — DEXAMETHASONE SODIUM PHOSPHATE 10 MG/ML IJ SOLN
INTRAMUSCULAR | Status: DC | PRN
  Administered 2017-04-07: 10 mg via INTRAVENOUS

## 2017-04-07 MED ORDER — ONDANSETRON HCL 4 MG/2ML IJ SOLN
INTRAMUSCULAR | Status: DC | PRN
  Administered 2017-04-07: 4 mg via INTRAVENOUS

## 2017-04-07 MED ORDER — HYDROMORPHONE HCL 1 MG/ML IJ SOLN
0.2500 mg | INTRAMUSCULAR | Status: DC | PRN
  Administered 2017-04-07 (×2): 0.5 mg via INTRAVENOUS

## 2017-04-07 MED ORDER — DIPHENOXYLATE-ATROPINE 2.5-0.025 MG PO TABS: 1.0000 | ORAL_TABLET | Freq: Four times a day (QID) | ORAL | Status: DC | PRN

## 2017-04-07 MED ORDER — OXYCODONE HCL 5 MG PO TABS
ORAL_TABLET | ORAL | Status: AC
  Filled 2017-04-07: qty 1

## 2017-04-07 MED ORDER — ROCURONIUM BROMIDE 10 MG/ML (PF) SYRINGE
PREFILLED_SYRINGE | INTRAVENOUS | Status: DC | PRN
  Administered 2017-04-07 (×2): 50 mg via INTRAVENOUS
  Administered 2017-04-07: 20 mg via INTRAVENOUS
  Administered 2017-04-07: 30 mg via INTRAVENOUS
  Administered 2017-04-07: 50 mg via INTRAVENOUS

## 2017-04-07 MED ORDER — SIMETHICONE 80 MG PO CHEW
40.0000 mg | CHEWABLE_TABLET | Freq: Four times a day (QID) | ORAL | Status: DC | PRN
  Administered 2017-04-08: 40 mg via ORAL
  Filled 2017-04-07: qty 1

## 2017-04-07 MED ORDER — SODIUM CHLORIDE 0.9 % IJ SOLN
INTRAMUSCULAR | Status: AC
  Filled 2017-04-07: qty 10

## 2017-04-07 MED ORDER — ROCURONIUM BROMIDE 10 MG/ML (PF) SYRINGE
PREFILLED_SYRINGE | INTRAVENOUS | Status: AC
  Filled 2017-04-07: qty 5

## 2017-04-07 MED ORDER — METHOCARBAMOL 500 MG PO TABS
500.0000 mg | ORAL_TABLET | Freq: Four times a day (QID) | ORAL | Status: DC | PRN
  Administered 2017-04-07 – 2017-04-09 (×5): 500 mg via ORAL
  Filled 2017-04-07 (×5): qty 1

## 2017-04-07 MED ORDER — HYDROMORPHONE HCL 1 MG/ML IJ SOLN
1.0000 mg | INTRAMUSCULAR | Status: DC | PRN
  Administered 2017-04-07: 1 mg via INTRAVENOUS
  Filled 2017-04-07: qty 1

## 2017-04-07 MED ORDER — SODIUM CHLORIDE 0.9 % IV SOLN
Freq: Once | INTRAVENOUS | Status: DC
  Filled 2017-04-07: qty 1

## 2017-04-07 MED ORDER — FENTANYL CITRATE (PF) 250 MCG/5ML IJ SOLN
INTRAMUSCULAR | Status: AC
Start: 2017-04-07 — End: 2017-04-07
  Filled 2017-04-07: qty 5

## 2017-04-07 MED ORDER — ONDANSETRON HCL 4 MG/2ML IJ SOLN
INTRAMUSCULAR | Status: AC
  Filled 2017-04-07: qty 2

## 2017-04-07 MED ORDER — FENTANYL CITRATE (PF) 250 MCG/5ML IJ SOLN
INTRAMUSCULAR | Status: AC
  Filled 2017-04-07: qty 5

## 2017-04-07 MED ORDER — DEXAMETHASONE SODIUM PHOSPHATE 10 MG/ML IJ SOLN
INTRAMUSCULAR | Status: AC
  Filled 2017-04-07: qty 1

## 2017-04-07 MED ORDER — CEFAZOLIN SODIUM-DEXTROSE 2-4 GM/100ML-% IV SOLN
2.0000 g | INTRAVENOUS | Status: AC
  Administered 2017-04-07 (×2): 2 g via INTRAVENOUS
  Filled 2017-04-07: qty 100

## 2017-04-07 MED ORDER — SODIUM CHLORIDE 0.9 % IV SOLN
INTRAVENOUS | Status: DC
  Administered 2017-04-07: 18:00:00 via INTRAVENOUS

## 2017-04-07 MED ORDER — LIDOCAINE 2% (20 MG/ML) 5 ML SYRINGE
INTRAMUSCULAR | Status: DC | PRN
  Administered 2017-04-07: 100 mg via INTRAVENOUS

## 2017-04-07 MED ORDER — PROPOFOL 10 MG/ML IV BOLUS
INTRAVENOUS | Status: AC
  Filled 2017-04-07: qty 40

## 2017-04-07 MED ORDER — ARTIFICIAL TEARS OPHTHALMIC OINT
TOPICAL_OINTMENT | OPHTHALMIC | Status: AC
  Filled 2017-04-07: qty 3.5

## 2017-04-07 MED ORDER — BUPIVACAINE-EPINEPHRINE (PF) 0.5% -1:200000 IJ SOLN
INTRAMUSCULAR | Status: DC | PRN
  Administered 2017-04-07: 30 mL via PERINEURAL

## 2017-04-07 MED ORDER — PHENYLEPHRINE 40 MCG/ML (10ML) SYRINGE FOR IV PUSH (FOR BLOOD PRESSURE SUPPORT)
PREFILLED_SYRINGE | INTRAVENOUS | Status: AC
  Filled 2017-04-07: qty 10

## 2017-04-07 MED ORDER — ALPRAZOLAM 0.5 MG PO TABS
0.5000 mg | ORAL_TABLET | Freq: Every evening | ORAL | Status: DC | PRN
  Administered 2017-04-07 – 2017-04-08 (×2): 0.5 mg via ORAL
  Filled 2017-04-07 (×2): qty 1

## 2017-04-07 MED ORDER — DIPHENHYDRAMINE HCL 50 MG/ML IJ SOLN
12.5000 mg | Freq: Three times a day (TID) | INTRAMUSCULAR | Status: DC | PRN
  Administered 2017-04-07 – 2017-04-08 (×2): 12.5 mg via INTRAVENOUS
  Filled 2017-04-07 (×2): qty 1

## 2017-04-07 MED ORDER — ONDANSETRON 4 MG PO TBDP: 4.0000 mg | ORAL_TABLET | Freq: Four times a day (QID) | ORAL | Status: DC | PRN

## 2017-04-07 MED ORDER — SODIUM CHLORIDE 0.9 % IV SOLN
INTRAVENOUS | Status: DC | PRN
  Administered 2017-04-07: 09:00:00

## 2017-04-07 MED ORDER — CEFAZOLIN SODIUM 1 G IJ SOLR
INTRAMUSCULAR | Status: AC
  Filled 2017-04-07: qty 20

## 2017-04-07 MED ORDER — OXYCODONE HCL 5 MG PO TABS
5.0000 mg | ORAL_TABLET | ORAL | Status: DC | PRN
  Administered 2017-04-07: 10 mg via ORAL
  Administered 2017-04-07: 5 mg via ORAL
  Administered 2017-04-08 – 2017-04-09 (×10): 10 mg via ORAL
  Filled 2017-04-07 (×11): qty 2

## 2017-04-07 MED ORDER — GABAPENTIN 300 MG PO CAPS
300.0000 mg | ORAL_CAPSULE | ORAL | Status: AC
  Administered 2017-04-07: 300 mg via ORAL
  Filled 2017-04-07: qty 1

## 2017-04-07 MED ORDER — FENTANYL CITRATE (PF) 100 MCG/2ML IJ SOLN
INTRAMUSCULAR | Status: DC | PRN
  Administered 2017-04-07 (×5): 50 ug via INTRAVENOUS
  Administered 2017-04-07: 100 ug via INTRAVENOUS
  Administered 2017-04-07: 50 ug via INTRAVENOUS
  Administered 2017-04-07: 100 ug via INTRAVENOUS
  Administered 2017-04-07: 50 ug via INTRAVENOUS
  Administered 2017-04-07: 100 ug via INTRAVENOUS
  Administered 2017-04-07 (×5): 50 ug via INTRAVENOUS

## 2017-04-07 MED ORDER — PHENYLEPHRINE HCL 10 MG/ML IJ SOLN
INTRAMUSCULAR | Status: DC | PRN
  Administered 2017-04-07 (×3): 40 ug via INTRAVENOUS

## 2017-04-07 MED ORDER — HYDROMORPHONE HCL 1 MG/ML IJ SOLN
INTRAMUSCULAR | Status: AC
  Filled 2017-04-07: qty 0.5

## 2017-04-07 MED ORDER — ACETAMINOPHEN 500 MG PO TABS
1000.0000 mg | ORAL_TABLET | ORAL | Status: AC
  Administered 2017-04-07: 1000 mg via ORAL
  Filled 2017-04-07: qty 2

## 2017-04-07 MED ORDER — LIDOCAINE 2% (20 MG/ML) 5 ML SYRINGE
INTRAMUSCULAR | Status: AC
  Filled 2017-04-07: qty 5

## 2017-04-07 MED ORDER — NEOSTIGMINE METHYLSULFATE 5 MG/5ML IV SOSY
PREFILLED_SYRINGE | INTRAVENOUS | Status: AC
  Filled 2017-04-07: qty 10

## 2017-04-07 MED ORDER — PROPOFOL 10 MG/ML IV BOLUS
INTRAVENOUS | Status: DC | PRN
  Administered 2017-04-07: 150 mg via INTRAVENOUS

## 2017-04-07 MED ORDER — NEOSTIGMINE METHYLSULFATE 5 MG/5ML IV SOSY
PREFILLED_SYRINGE | INTRAVENOUS | Status: AC
  Filled 2017-04-07: qty 5

## 2017-04-07 MED ORDER — ACETAMINOPHEN 325 MG PO TABS
650.0000 mg | ORAL_TABLET | Freq: Four times a day (QID) | ORAL | Status: DC | PRN
  Administered 2017-04-09 (×2): 650 mg via ORAL
  Filled 2017-04-07 (×2): qty 2

## 2017-04-07 MED ORDER — LACTATED RINGERS IV SOLN
INTRAVENOUS | Status: DC | PRN
  Administered 2017-04-07 (×3): via INTRAVENOUS

## 2017-04-07 MED ORDER — TECHNETIUM TC 99M SULFUR COLLOID FILTERED
1.0000 | Freq: Once | INTRAVENOUS | Status: AC | PRN
  Administered 2017-04-07: 1 via INTRADERMAL

## 2017-04-07 MED ORDER — HYDROMORPHONE HCL 1 MG/ML IJ SOLN
INTRAMUSCULAR | Status: AC
  Administered 2017-04-07: 0.5 mg via INTRAVENOUS
  Filled 2017-04-07: qty 0.5

## 2017-04-07 MED ORDER — GLYCOPYRROLATE 0.2 MG/ML IV SOSY
PREFILLED_SYRINGE | INTRAVENOUS | Status: DC | PRN
  Administered 2017-04-07: 0.6 mg via INTRAVENOUS

## 2017-04-07 MED ORDER — MIDAZOLAM HCL 5 MG/5ML IJ SOLN
INTRAMUSCULAR | Status: DC | PRN
  Administered 2017-04-07: 2 mg via INTRAVENOUS

## 2017-04-07 MED ORDER — HYDROMORPHONE HCL 1 MG/ML IJ SOLN
INTRAMUSCULAR | Status: DC | PRN
  Administered 2017-04-07 (×2): 0.5 mg via INTRAVENOUS

## 2017-04-07 MED ORDER — ACETAMINOPHEN 650 MG RE SUPP: 650.0000 mg | Freq: Four times a day (QID) | RECTAL | Status: DC | PRN

## 2017-04-07 MED ORDER — MEPERIDINE HCL 25 MG/ML IJ SOLN: 6.2500 mg | INTRAMUSCULAR | Status: DC | PRN

## 2017-04-07 MED ORDER — ONDANSETRON HCL 4 MG/2ML IJ SOLN: 4.0000 mg | Freq: Once | INTRAMUSCULAR | Status: DC | PRN

## 2017-04-07 MED ORDER — 0.9 % SODIUM CHLORIDE (POUR BTL) OPTIME
TOPICAL | Status: DC | PRN
  Administered 2017-04-07 (×3): 1000 mL

## 2017-04-07 MED ORDER — ENOXAPARIN SODIUM 40 MG/0.4ML ~~LOC~~ SOLN: 40.0000 mg | SUBCUTANEOUS | Status: DC

## 2017-04-07 MED ORDER — DIPHENHYDRAMINE HCL 50 MG/ML IJ SOLN
12.5000 mg | Freq: Once | INTRAMUSCULAR | Status: AC
  Administered 2017-04-07: 12.5 mg via INTRAVENOUS
  Filled 2017-04-07: qty 1

## 2017-04-07 MED ORDER — LIDOCAINE-EPINEPHRINE (PF) 1.5 %-1:200000 IJ SOLN
INTRAMUSCULAR | Status: DC | PRN
  Administered 2017-04-07: 30 mL via PERINEURAL

## 2017-04-07 MED ORDER — CEFAZOLIN SODIUM-DEXTROSE 2-4 GM/100ML-% IV SOLN
2.0000 g | Freq: Three times a day (TID) | INTRAVENOUS | Status: DC
  Administered 2017-04-07 – 2017-04-08 (×3): 2 g via INTRAVENOUS
  Filled 2017-04-07 (×4): qty 100

## 2017-04-07 MED ORDER — MIDAZOLAM HCL 2 MG/2ML IJ SOLN
INTRAMUSCULAR | Status: AC
  Filled 2017-04-07: qty 2

## 2017-04-07 SURGICAL SUPPLY — 91 items
APPLIER CLIP 9.375 MED OPEN (MISCELLANEOUS) ×4
ATCH SMKEVC FLXB CAUT HNDSWH (FILTER) ×2 IMPLANT
BAG DECANTER FOR FLEXI CONT (MISCELLANEOUS) ×4 IMPLANT
BINDER BREAST LRG (GAUZE/BANDAGES/DRESSINGS) IMPLANT
BINDER BREAST XLRG (GAUZE/BANDAGES/DRESSINGS) ×4 IMPLANT
BIOPATCH RED 1 DISK 7.0 (GAUZE/BANDAGES/DRESSINGS) ×9 IMPLANT
BIOPATCH RED 1IN DISK 7.0MM (GAUZE/BANDAGES/DRESSINGS) ×3
BLADE 10 SAFETY STRL DISP (BLADE) ×4 IMPLANT
BLADE SURG 15 STRL LF DISP TIS (BLADE) ×2 IMPLANT
BLADE SURG 15 STRL SS (BLADE) ×2
CANISTER SUCT 3000ML PPV (MISCELLANEOUS) ×8 IMPLANT
CHLORAPREP W/TINT 10.5 ML (MISCELLANEOUS) IMPLANT
CHLORAPREP W/TINT 26ML (MISCELLANEOUS) ×4 IMPLANT
CLIP APPLIE 9.375 MED OPEN (MISCELLANEOUS) ×2 IMPLANT
CLOSURE WOUND 1/2 X4 (GAUZE/BANDAGES/DRESSINGS) ×1
CONT SPEC 4OZ CLIKSEAL STRL BL (MISCELLANEOUS) ×4 IMPLANT
COVER SURGICAL LIGHT HANDLE (MISCELLANEOUS) ×8 IMPLANT
COVER TRANSDUCER ULTRASND GEL (DRAPE) ×4 IMPLANT
DECANTER SPIKE VIAL GLASS SM (MISCELLANEOUS) IMPLANT
DERMABOND ADVANCED (GAUZE/BANDAGES/DRESSINGS) ×2
DERMABOND ADVANCED .7 DNX12 (GAUZE/BANDAGES/DRESSINGS) ×2 IMPLANT
DEVICE DUBIN SPECIMEN MAMMOGRA (MISCELLANEOUS) ×4 IMPLANT
DRAIN CHANNEL 19F RND (DRAIN) ×16 IMPLANT
DRAPE CHEST BREAST 15X10 FENES (DRAPES) ×4 IMPLANT
DRAPE HALF SHEET 40X57 (DRAPES) ×12 IMPLANT
DRAPE LAPAROTOMY 100X72 PEDS (DRAPES) IMPLANT
DRAPE ORTHO SPLIT 77X108 STRL (DRAPES) ×4
DRAPE ORTHO SPLIT 87X125 STRL (DRAPES) ×8 IMPLANT
DRAPE SURG 17X23 STRL (DRAPES) ×16 IMPLANT
DRAPE SURG ORHT 6 SPLT 77X108 (DRAPES) ×4 IMPLANT
DRAPE WARM FLUID 44X44 (DRAPE) ×4 IMPLANT
DRSG PAD ABDOMINAL 8X10 ST (GAUZE/BANDAGES/DRESSINGS) ×4 IMPLANT
DRSG SORBAVIEW 3.5X5-5/16 MED (GAUZE/BANDAGES/DRESSINGS) ×8 IMPLANT
ELECT BLADE 4.0 EZ CLEAN MEGAD (MISCELLANEOUS) ×4
ELECT BLADE 6.5 EXT (BLADE) ×4 IMPLANT
ELECT CAUTERY BLADE 6.4 (BLADE) ×8 IMPLANT
ELECT REM PT RETURN 9FT ADLT (ELECTROSURGICAL) ×8
ELECTRODE BLDE 4.0 EZ CLN MEGD (MISCELLANEOUS) ×2 IMPLANT
ELECTRODE REM PT RTRN 9FT ADLT (ELECTROSURGICAL) ×4 IMPLANT
EVACUATOR SILICONE 100CC (DRAIN) ×16 IMPLANT
EVACUATOR SMOKE ACCUVAC VALLEY (FILTER) ×2
GAUZE SPONGE 4X4 16PLY XRAY LF (GAUZE/BANDAGES/DRESSINGS) ×4 IMPLANT
GLOVE BIO SURGEON STRL SZ7 (GLOVE) ×8 IMPLANT
GLOVE BIO SURGEON STRL SZ7.5 (GLOVE) ×4 IMPLANT
GLOVE BIOGEL PI IND STRL 7.5 (GLOVE) ×2 IMPLANT
GLOVE BIOGEL PI IND STRL 8 (GLOVE) ×2 IMPLANT
GLOVE BIOGEL PI INDICATOR 7.5 (GLOVE) ×2
GLOVE BIOGEL PI INDICATOR 8 (GLOVE) ×2
GOWN STRL REUS W/ TWL LRG LVL3 (GOWN DISPOSABLE) ×6 IMPLANT
GOWN STRL REUS W/ TWL XL LVL3 (GOWN DISPOSABLE) ×2 IMPLANT
GOWN STRL REUS W/TWL LRG LVL3 (GOWN DISPOSABLE) ×6
GOWN STRL REUS W/TWL XL LVL3 (GOWN DISPOSABLE) ×2
GRAFT FLEX HD 8X16 THICK (Tissue Mesh) ×8 IMPLANT
IMPL BREAST TIS EXP M 650CC (Breast) ×4 IMPLANT
IMPLANT BREAST TIS EXP M 650CC (Breast) ×8 IMPLANT
KIT BASIN OR (CUSTOM PROCEDURE TRAY) ×8 IMPLANT
KIT MARKER MARGIN INK (KITS) ×4 IMPLANT
KIT ROOM TURNOVER OR (KITS) ×8 IMPLANT
LIGHT WAVEGUIDE WIDE FLAT (MISCELLANEOUS) ×4 IMPLANT
MARKER SKIN DUAL TIP RULER LAB (MISCELLANEOUS) ×8 IMPLANT
NEEDLE FILTER BLUNT 18X 1/2SAF (NEEDLE)
NEEDLE FILTER BLUNT 18X1 1/2 (NEEDLE) IMPLANT
NEEDLE HYPO 25GX1X1/2 BEV (NEEDLE) ×4 IMPLANT
NEEDLE HYPO 25X1 1.5 SAFETY (NEEDLE) IMPLANT
NS IRRIG 1000ML POUR BTL (IV SOLUTION) ×12 IMPLANT
PACK GENERAL/GYN (CUSTOM PROCEDURE TRAY) ×8 IMPLANT
PACK SURGICAL SETUP 50X90 (CUSTOM PROCEDURE TRAY) ×4 IMPLANT
PAD ARMBOARD 7.5X6 YLW CONV (MISCELLANEOUS) ×12 IMPLANT
PENCIL BUTTON HOLSTER BLD 10FT (ELECTRODE) ×4 IMPLANT
PREFILTER EVAC NS 1 1/3-3/8IN (MISCELLANEOUS) ×4 IMPLANT
SPONGE LAP 18X18 X RAY DECT (DISPOSABLE) ×8 IMPLANT
STAPLER VISISTAT 35W (STAPLE) ×4 IMPLANT
STRIP CLOSURE SKIN 1/2X4 (GAUZE/BANDAGES/DRESSINGS) ×3 IMPLANT
SUT ETHILON 2 0 FS 18 (SUTURE) IMPLANT
SUT MNCRL AB 3-0 PS2 18 (SUTURE) ×20 IMPLANT
SUT MNCRL AB 4-0 PS2 18 (SUTURE) ×4 IMPLANT
SUT PDS AB 3-0 SH 27 (SUTURE) ×20 IMPLANT
SUT PROLENE 3 0 PS 2 (SUTURE) ×16 IMPLANT
SUT SILK 2 0 SH (SUTURE) IMPLANT
SUT VIC AB 3-0 SH 18 (SUTURE) ×8 IMPLANT
SUT VIC AB 3-0 SH 27 (SUTURE)
SUT VIC AB 3-0 SH 27X BRD (SUTURE) IMPLANT
SYR BULB IRRIGATION 50ML (SYRINGE) ×4 IMPLANT
SYR CONTROL 10ML LL (SYRINGE) IMPLANT
TOWEL OR 17X24 6PK STRL BLUE (TOWEL DISPOSABLE) ×8 IMPLANT
TOWEL OR 17X26 10 PK STRL BLUE (TOWEL DISPOSABLE) ×4 IMPLANT
TRAY FOLEY CATH SILVER 16FR (SET/KITS/TRAYS/PACK) ×4 IMPLANT
TUBE CONNECTING 12'X1/4 (SUCTIONS) ×1
TUBE CONNECTING 12X1/4 (SUCTIONS) ×3 IMPLANT
WATER STERILE IRR 1000ML POUR (IV SOLUTION) IMPLANT
YANKAUER SUCT BULB TIP NO VENT (SUCTIONS) ×4 IMPLANT

## 2017-04-07 NOTE — Transfer of Care (Signed)
Immediate Anesthesia Transfer of Care Note  Patient: April Orozco  Procedure(s) Performed: Procedure(s): RIGHT SKIN SPARING MASTECTOMY WITH RADIOACTIVE SEED GUIDED AXILLARY LYMPH NODE EXCISION AND AXILLARY SENTINEL LYMPH NODE BIOPSY, LEFT PROPHYLACTIC SKIN SPARING MASTECTOMY (Bilateral) REMOVAL PORT-A-CATH (N/A) BILATERAL BREAST RECONSTRUCTION WITH PLACEMENT OF TISSUE EXPANDER AND FLEX HD (ACELLULAR HYDRATED DERMIS) (Bilateral)  Patient Location: PACU  Anesthesia Type:General  Level of Consciousness: patient cooperative and responds to stimulation  Airway & Oxygen Therapy: Patient Spontanous Breathing and Patient connected to nasal cannula oxygen  Post-op Assessment: Report given to RN and Post -op Vital signs reviewed and stable  Post vital signs: Reviewed and stable  Last Vitals:  Vitals:   04/07/17 0615  BP: (!) 116/45  Pulse: 86  Resp: 18  Temp: 36.7 C    Last Pain:  Vitals:   04/07/17 0615  TempSrc: Oral         Complications: No apparent anesthesia complications

## 2017-04-07 NOTE — Brief Op Note (Signed)
04/07/2017  12:31 PM  PATIENT:  April Orozco  51 y.o. female  PRE-OPERATIVE DIAGNOSIS:  RIGHT BREAST CANCER  POST-OPERATIVE DIAGNOSIS:  RIGHT BREAST CANCER  PROCEDURE:  Procedure(s): RIGHT SKIN SPARING MASTECTOMY WITH RADIOACTIVE SEED GUIDED AXILLARY LYMPH NODE EXCISION AND AXILLARY SENTINEL LYMPH NODE BIOPSY, LEFT PROPHYLACTIC SKIN SPARING MASTECTOMY (Bilateral) REMOVAL PORT-A-CATH (N/A) BILATERAL BREAST RECONSTRUCTION WITH PLACEMENT OF TISSUE EXPANDER AND FLEX HD (ACELLULAR HYDRATED DERMIS) (Bilateral)  SURGEON:  Surgeon(s) and Role: Panel 1:    Rolm Bookbinder, MD - Primary  Panel 2:    * Crissie Reese, MD - Primary  PHYSICIAN ASSISTANT:   ASSISTANTS: Judyann Munson, RNFA   ANESTHESIA:   general  EBL:  Total I/O In: 6 [I.V.:2600] Out: 40 [Urine:230; Blood:25]  BLOOD ADMINISTERED:none  DRAINS: (4) Jackson-Pratt drain(s) with closed bulb suction in the right mastectomy space (2) and left mastectomy space (2)   LOCAL MEDICATIONS USED:  NONE  SPECIMEN:  No Specimen  DISPOSITION OF SPECIMEN:  N/A  COUNTS:  YES  TOURNIQUET:  * No tourniquets in log *  DICTATION: .Other Dictation: Dictation Number [id number]  PLAN OF CARE: Admit for overnight observation  PATIENT DISPOSITION:  PACU - hemodynamically stable.   Delay start of Pharmacological VTE agent (>24hrs) due to surgical blood loss or risk of bleeding: no

## 2017-04-07 NOTE — Op Note (Signed)
Preoperative diagnosis: right breast cancer clinical stage II s/p primary chemotherapy Postoperative diagnosis: same as above Procedure:  1. Left prophylactic skin sparing mastectomy 2  Right skin sparing mastectomy 3. Right targeted axillary dissection (removal seed containing node and deep sentinel node biopsy) 4. Cloud removal Surgeon: Dr. Serita Grammes Anesthesia: Gen. with pectoral block bilaterally Specimens: Left breast marked short superior, long lateral Right breast marked short superior, long lateral Axillary seed separate RIght deep axillary sentinel lymph nodes with highest count of 563 Port Estimated blood loss: Minimal Drains: per plastic surgery Complications: None Sponge count was correct at completion Disposition to recovery stable  Indications: This is a 58 yof with clinical stage II right breast cancer that was node positive on diagnosis. She has undergone primary chemotherapy which was stopped due to side effects.  Her mri and exam show no abnormal right axillary nodes now.  Her breast cancer has a moderate response.  We discussed all options. As outlined in my note, she does not want to pursue anything but bilateral mastectomies with immediate reconstruction.  We also discussed a TAD and port removal.  She has seen plastic surgery and will undergo ssm.   Procedure: After informed consent was obtained the patient was taken to the operating room. She had undergone bilateral pectoral blocks and had received technetium in the standard periareolar fashion. She received antibiotics. SCDs were in place. She was placed under general anesthesia without complication. She was prepped and draped in the standard sterile surgical fashion. Surgical timeout was then performed.  I made an elliptical incision to encompass the nipple areolar complex on the left side. I think created flaps to the clavicle superiorly, parasternal area medially, inframammary crease, and the latissimus  laterally. The breast and the pectoralis fascia were then removed from the pectoralis muscle. Specimen was marked as above. This was passed off the table. Hemostasis was observed. I then packed antibiotic soaked sponges in it. I then went to the right side. Imade an elliptical incision to encompass the nipple areolar complex on the left side with an attempt to save as much skin as possible. I then created flaps to the clavicle superiorly, parasternal area medially, inframammary crease, and the latissimus laterally. The breast and the pectoralis fascia were then removed from the pectoralis muscle. Specimen was marked as above. This was passed off the table.  I then used the neoprobe to identify the sentinel lymph node and the radioactive seed.   I entered the axilla. The radioactive seed was present just lying in the soft tissue. It was not really implanted in a node. I removed this and documented its removal with mammography.  I removed the sentinel nodes then also.  There was no background radioactivity and no enlarged lymph nodes present. I did a mammogram of this specimen and do not see the initial clip.  I did not feel removal of any more nodal tissue is indicated and will wait for final pathology.  I then was able to identify the port pocket from the mastectomy incision. I removed this in its entirety.  I removed all suture material.  I then packed the right side and turned case over to plastic surgery.

## 2017-04-07 NOTE — Progress Notes (Signed)
Per Pharmacy, okay to order morphine even with contraindication of allergy to tramadol.

## 2017-04-07 NOTE — Anesthesia Preprocedure Evaluation (Addendum)
Anesthesia Evaluation  Patient identified by MRN, date of birth, ID band Patient awake    Airway Mallampati: I  TM Distance: >3 FB Neck ROM: Full    Dental  (+) Upper Dentures, Lower Dentures   Pulmonary neg pulmonary ROS,    Pulmonary exam normal breath sounds clear to auscultation       Cardiovascular Normal cardiovascular exam+ Valvular Problems/Murmurs  Rhythm:Regular Rate:Normal     Neuro/Psych Anxiety negative neurological ROS     GI/Hepatic negative GI ROS, Neg liver ROS,   Endo/Other  negative endocrine ROS  Renal/GU negative Renal ROS     Musculoskeletal negative musculoskeletal ROS (+)   Abdominal Normal abdominal exam  (+)   Peds  Hematology negative hematology ROS (+)   Anesthesia Other Findings   Reproductive/Obstetrics                            Anesthesia Physical Anesthesia Plan  ASA: II  Anesthesia Plan: General   Post-op Pain Management:  Regional for Post-op pain   Induction: Intravenous  Airway Management Planned: Oral ETT  Additional Equipment:   Intra-op Plan:   Post-operative Plan: Extubation in OR  Informed Consent: I have reviewed the patients History and Physical, chart, labs and discussed the procedure including the risks, benefits and alternatives for the proposed anesthesia with the patient or authorized representative who has indicated his/her understanding and acceptance.     Plan Discussed with: Surgeon and CRNA  Anesthesia Plan Comments:        Anesthesia Quick Evaluation

## 2017-04-07 NOTE — Anesthesia Procedure Notes (Signed)
Anesthesia Regional Block: Pectoralis block   Pre-Anesthetic Checklist: ,, timeout performed, Correct Patient, Correct Site, Correct Laterality, Correct Procedure, Correct Position, site marked, Risks and benefits discussed,  Surgical consent,  Pre-op evaluation,  At surgeon's request and post-op pain management  Laterality: Right and Left  Prep: chloraprep       Needles:  Injection technique: Single-shot     Needle Length: 9cm  Needle Gauge: 21     Additional Needles:   Procedures: ultrasound guided,,,,,,,,  Narrative:  Start time: 04/07/2017 7:15 AM End time: 04/07/2017 7:30 AM Injection made incrementally with aspirations every 5 mL.  Performed by: Personally  Anesthesiologist: Lillia Abed  Additional Notes: Monitors applied. Patient sedated. Sterile prep and drape,hand hygiene and sterile gloves were used. Relevant anatomy identified.Needle position confirmed.Bilateral blocks done sequentially. Local anesthetic injected incrementally after negative aspiration. Local anesthetic spread visualized. Vascular puncture avoided. No complications. Images printed for medical record.The patient tolerated the procedure well.

## 2017-04-07 NOTE — H&P (Signed)
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 she has learned that she has three 1st cousins with breast cancer all around the age of 50. she underwent mm that showed c density breasts. there is a 2.6 cm oval mass at 9 oclock in the right breast. there was also a 6 mm left breast area that ends up being benign tissue. us showed a 2.7 cm mass with some skin thickening and a lobulated axillary node. 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 she has no discharge. mri showed a 2.7x2.4x2.5 cm mass in right breast with no abnl nodes, no malignancy in left breast or other disease on right. 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 f/u mri shows a 1.5 cm right breast mass and normal nodes. she is here today to discuss surgery. she is adamant about wanting double mastectomies. her genetics is negative and she has already seen plastic surgery   Past Surgical History (Aishwarya Shiplett, MD; 03/10/2017 12:22 PM) Appendectomy  Breast Biopsy  Right. Tonsillectomy   Diagnostic Studies History (Clela Hagadorn, MD; 03/10/2017 12:22 PM) Colonoscopy  never Mammogram  within last year Pap Smear  >5 years ago  Allergies (Tiffaney Heimann, MD; 03/10/2017 12:22 PM) Sulfa Antibiotics  Hives. Tramadol  Rash.  Medication History (Dezra Mandella, MD; 03/10/2017 12:22 PM) Medications Reconciled Flomax (0.4MG Capsule, Oral) Active. Albuterol (90MCG/ACT Aerosol Soln, Inhalation) Active. Oxycodone-Acetaminophen (5-325MG Tablet, Oral as needed) Active. Xanax (0.5MG Tablet, Oral daily) Active. (Take one tablet at bedtime.) Compazine (10MG Tablet, Oral as needed) Active. Zofran (8MG Tablet, Oral as needed) Active. Ambien (5MG Tablet, Oral daily) Active.  Social History (Bianka Liberati, MD; 03/10/2017 12:22 PM) Alcohol use   Occasional alcohol use. Caffeine use  Coffee. No drug use  Tobacco use  Former smoker.  Family History (Naiyana Barbian, MD; 03/10/2017 12:22 PM) Bleeding disorder  Mother. Breast Cancer  Family Members In General. Colon Cancer  Family Members In General. Colon Polyps  Family Members In General. Hypertension  Father, Mother. Melanoma  Father. Prostate Cancer  Family Members In General.  Vitals (Alisha Spillers CMA; 03/10/2017 10:38 AM) 03/10/2017 10:37 AM Weight: 182 lb Height: 65in Body Surface Area: 1.9 m Body Mass Index: 30.29 kg/m  BP: 132/78 (Sitting, Left Arm, Standard) Physical Exam (Mckenzee Beem MD; 03/10/2017 12:23 PM) General Mental Status-Alert. Orientation-Oriented X3. Head and Neck Thyroid -Note: no thyroid mass. Eye Sclera/Conjunctiva - Bilateral-No scleral icterus. Chest and Lung Exam Chest and lung exam reveals -on auscultation, normal breath sounds, no adventitious sounds and normal vocal resonance. Breast Nipples-No Discharge. Note: <2 cm right loq breast mass, mobile, nontender No enlarged axillary nodes Cardiovascular Cardiovascular examination reveals -normal heart sounds, regular rate and rhythm with no murmurs. Lymphatic Head & Neck General Head & Neck Lymphatics: Bilateral - Description - Normal. Axillary General Axillary Region: Bilateral - Description - Normal. 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 I told her directly I did not recommend this and specifically it provides no advantage for her cancer in terms of local or systemic recurrence or prevention on the other side. She understands mastectomy has up to   5% risk of recurrence or primary cancer on even  unaffected side. she also understands that her quality of life and ability to work will be less with this surgery as opposed to BCT. Her genes are negative. we will plan on right ssm (she was told by plastics to not pursue nsm) and prophylactic left ssm. she understands risks. she understands will likely need radiation on the right. we discussed right axillary sentinel node and seed guided excision of pos node (TAD) on the right side as well. she understands risk of lymphedema with this. she also would like port removal although understands there is small chance of her 2 pos disease on repeat.   

## 2017-04-07 NOTE — Interval H&P Note (Signed)
History and Physical Interval Note:  04/07/2017 7:14 AM  [name]  has presented today for surgery, with the diagnosis of RIGHT BREAST CANCER  The various methods of treatment have been discussed with the patient and family. After consideration of risks, benefits and other options for treatment, the patient has consented to  Procedure(s): RIGHT SKIN SPARING MASTECTOMY WITH RADIOACTIVE SEED GUIDED AXILLARY LYMPH NODE EXCISION AND AXILLARY SENTINEL LYMPH NODE BIOPSY, LEFT PROPHYLACTIC SKIN SPARING MASTECTOMY (Bilateral) REMOVAL PORT-A-CATH (N/A) BREAST RECONSTRUCTION WITH PLACEMENT OF TISSUE EXPANDER AND FLEX HD (ACELLULAR HYDRATED DERMIS) (Bilateral) as a surgical intervention .  The patient's history has been reviewed, patient examined, no change in status, stable for surgery.  I have reviewed the patient's chart and labs.  Questions were answered to the patient's satisfaction.     April Orozco

## 2017-04-07 NOTE — Anesthesia Postprocedure Evaluation (Signed)
Anesthesia Post Note  Patient: April Orozco  Procedure(s) Performed: Procedure(s) (LRB): RIGHT SKIN SPARING MASTECTOMY WITH RADIOACTIVE SEED GUIDED AXILLARY LYMPH NODE EXCISION AND AXILLARY SENTINEL LYMPH NODE BIOPSY, LEFT PROPHYLACTIC SKIN SPARING MASTECTOMY (Bilateral) REMOVAL PORT-A-CATH (N/A) BILATERAL BREAST RECONSTRUCTION WITH PLACEMENT OF TISSUE EXPANDER AND FLEX HD (ACELLULAR HYDRATED DERMIS) (Bilateral)  Patient location during evaluation: PACU Anesthesia Type: General Level of consciousness: awake and alert Pain management: pain level controlled Vital Signs Assessment: post-procedure vital signs reviewed and stable Respiratory status: spontaneous breathing, nonlabored ventilation, respiratory function stable and patient connected to nasal cannula oxygen Cardiovascular status: blood pressure returned to baseline and stable Postop Assessment: no signs of nausea or vomiting Anesthetic complications: no       Last Vitals:  Vitals:   04/07/17 1442 04/07/17 1500  BP:  130/64  Pulse:  90  Resp:  18  Temp: 36.4 C 36.6 C    Last Pain:  Vitals:   04/07/17 1807  TempSrc:   PainSc: 7                  [name] DAVID

## 2017-04-08 ENCOUNTER — Encounter (HOSPITAL_COMMUNITY): Payer: Self-pay | Admitting: General Surgery

## 2017-04-08 DIAGNOSIS — C50311 Malignant neoplasm of lower-inner quadrant of right female breast: Secondary | ICD-10-CM | POA: Diagnosis not present

## 2017-04-08 MED ORDER — DOXYCYCLINE HYCLATE 100 MG PO TABS
100.0000 mg | ORAL_TABLET | Freq: Two times a day (BID) | ORAL | Status: DC
  Administered 2017-04-08 – 2017-04-09 (×3): 100 mg via ORAL
  Filled 2017-04-08 (×3): qty 1

## 2017-04-08 MED ORDER — DIPHENHYDRAMINE HCL 25 MG PO CAPS
25.0000 mg | ORAL_CAPSULE | Freq: Four times a day (QID) | ORAL | Status: DC | PRN
  Administered 2017-04-08 – 2017-04-09 (×4): 25 mg via ORAL
  Filled 2017-04-08 (×4): qty 1

## 2017-04-08 MED ORDER — KETOROLAC TROMETHAMINE 15 MG/ML IJ SOLN
15.0000 mg | Freq: Once | INTRAMUSCULAR | Status: AC
  Administered 2017-04-08: 15 mg via INTRAVENOUS
  Filled 2017-04-08: qty 1

## 2017-04-08 NOTE — Progress Notes (Signed)
1 Day Post-Op   Subjective/Chief Complaint: Pain especially right sided, no n/v, itching with pain meds   Objective: Vital signs in last 24 hours: Temp:  [97.5 F (36.4 C)-98.5 F (36.9 C)] 98.2 F (36.8 C) (05/15 0626) Pulse Rate:  [77-93] 88 (05/15 0626) Resp:  [9-19] 19 (05/15 0626) BP: (90-147)/(41-85) 90/42 (05/15 0626) SpO2:  [96 %-100 %] 99 % (05/15 0626)    Intake/Output from previous day: 05/14 0701 - 05/15 0700 In: 3954.5 [P.O.:522; I.V.:3232.5; IV Piggyback:200] Out: 686 [Urine:230; Drains:431; Blood:25] Intake/Output this shift: No intake/output data recorded.  drains serosang as expected, no hematoma, flaps viable  Lab Results:  No results for input(s): WBC, HGB, HCT, PLT in the last 72 hours. BMET No results for input(s): NA, K, CL, CO2, GLUCOSE, BUN, CREATININE, CALCIUM in the last 72 hours. PT/INR No results for input(s): LABPROT, INR in the last 72 hours. ABG No results for input(s): PHART, HCO3 in the last 72 hours.  Invalid input(s): PCO2, PO2  Studies/Results: No results found.  Anti-infectives: Anti-infectives    Start     Dose/Rate Route Frequency Ordered Stop   04/07/17 1900  ceFAZolin (ANCEF) IVPB 2g/100 mL premix     2 g 200 mL/hr over 30 Minutes Intravenous Every 8 hours 04/07/17 1423     04/07/17 1430  ceFAZolin (ANCEF) IVPB 1 g/50 mL premix  Status:  Discontinued     1 g 100 mL/hr over 30 Minutes Intravenous Every 6 hours 04/07/17 1422 04/07/17 1450   04/07/17 0844  bacitracin 50,000 Units, gentamicin (GARAMYCIN) 80 mg, ceFAZolin (ANCEF) 1 g in sodium chloride 0.9 % 1,000 mL  Status:  Discontinued       As needed 04/07/17 0844 04/07/17 1228   04/07/17 0715  bacitracin 50,000 Units, gentamicin (GARAMYCIN) 80 mg, ceFAZolin (ANCEF) 1 g in sodium chloride 0.9 % 1,000 mL  Status:  Discontinued      Irrigation Once 04/07/17 0712 04/07/17 1422   04/07/17 0608  ceFAZolin (ANCEF) IVPB 2g/100 mL premix     2 g 200 mL/hr over 30 Minutes  Intravenous On call to O.R. 04/07/17 0608 04/07/17 1134      Assessment/Plan: POD 1 bilateral ssm, right ax sn biopsy, port removal, expander reconstruction  1. Will give dose of toradol this am, cont mm relaxant, oral pain meds 2. pulm toilet, oob 3. Regular diet 4. Likely here until tomorrow   [facility] 04/08/2017

## 2017-04-08 NOTE — Op Note (Signed)
NAME:  April Orozco, April Orozco                     ACCOUNT NO.:  MEDICAL RECORD NO.:  [id number]  LOCATION:                                 FACILITY:  PHYSICIAN:  Crissie Reese, M.D.     DATE OF BIRTH:  10/04/1966  DATE OF PROCEDURE:  04/07/2017 DATE OF DISCHARGE:                              OPERATIVE REPORT   PREOPERATIVE DIAGNOSIS:  Right breast cancer.  POSTOPERATIVE DIAGNOSIS:  Right breast cancer.  PROCEDURES PERFORMED: 1. Right immediate breast reconstruction with tissue expander. 2. Left immediate breast reconstruction with tissue expander. 3. Distinct procedure, right chest wall reconstruction with acellular     dermal matrix 120 cm2. 4. Distinct procedure, a left chest wall reconstruction with acellular     dermal matrix 110 cm2.  SURGEON:  Crissie Reese, M.D.  ASSISTANT:  Judyann Munson, RNFA.  ANESTHESIA:  General.  ESTIMATED BLOOD LOSS:  30 mL.  DRAINS:  Two 19-French drains on each side.  CLINICAL NOTE:  This 51 year old woman has a right breast cancer and has planned for bilateral mastectomy with her general surgeon.  She was interested in breast reconstruction and options were discussed.  She selected reconstruction with tissue expander.  She did clearly understand that the oncologic plan for her was for her to undergo adjuvant radiation and that this would significantly change and increase some of the risks of the reconstruction.  She understood this and also understood the risks that include, but not limited to, bleeding, infection, healing problems, scarring, loss of sensation, fluid accumulations, anesthesia-related complications, contour deformities, contour deformities of the periphery of the reconstruction, tissue loss including skin loss, DVT, PE, chronic pain, asymmetry, need for secondary procedures, and overall disappointment.  Having discussed all of these, she also understood the use of the acellular dermal matrix and the rationale for its use as  well as the fact that we would need to try to expand her prior to her radiation.  She understood the possibility of loss of the reconstructions and then is for the eventual placement of implants.  She understood the risk of capsular contracture, failure of the device, displacement of the device, and wrinkles and ripples.  She wished to proceed.  DESCRIPTION OF PROCEDURE:  The General Surgery procedure had been completed with bilateral mastectomy.  The mastectomy flaps were inspected and found to be in excellent condition.  The skin appeared to have good vascularity.  Thorough irrigation with saline and hemostasis with electrocautery.  The pectoralis muscles were then elevated. Acellular dermal matrix was soaked in first saline for greater than 15 minutes and then antibiotic solution for greater than 15 minutes.  Once the muscles had been elevated, meticulous hemostasis was achieved with electrocautery.  Thorough irrigation with saline as well as with antibiotic solution.  The tissue expanders were also soaked in antibiotic solution.  These were Mentor 650 mL moderate height tissue expanders.  Having soaked the acellular dermal matrix, it was pie- crusted with a 15-blade and then after thoroughly cleaning gloves, the acellular dermal matrix was then positioned with care taken to make certain that its orientation was proper.  The epidermal side was placed down toward the  breast reconstruction space and the dermal side was placed up toward the mastectomy flaps.  The medial and inferior edges of the ADM were then secured to the tissues using 3-0 PDS simple interrupted sutures.  The tissue expanders were then prepared, placing 150 mL of sterile saline using the closed filling system.  All the air removed.  The tissue expanders were positioned and fixed at a tab using the 3-0 Vicryl suture.  Irrigation with antibiotic solution and then the ADM was then closed to the muscle edge using 3-0 PDS  simple interrupted sutures with care taken to avoid damage to the underlying tissue expander.  A small open window was left superolateral in order to promote drainage from the submuscular space.  A 19-French drain in position medial and lateral on each side, brought out through separate stab wound and secured using 3-0 Prolene suture.  The skin edges were trimmed as necessary, appeared viable.  The closure of the skin with 3-0 Monocryl interrupted inverted deep dermal sutures.  Dermabond, Biopatch, dry sterile dressings were applied around drains and dry sterile dressings to the chest and the chest vest was then positioned.  She was transported to the recovery room.  ADDENDUM:  The expanders were filled bilateral to 500 mL using sterile saline via closed filling system.     Crissie Reese, M.D.     DB/MEDQ  D:  04/07/2017  T:  04/07/2017  Job:  [id number]

## 2017-04-08 NOTE — Progress Notes (Signed)
Pt requiring prn pain medicine ahead of scheduled times, scores pain around 9/10 upon movement. Morphine 4mg  iv administered at 0805 but pt now c/o itching. Benadryl ordered prn q8 and not scheduled to administer at this time. MD's office called

## 2017-04-08 NOTE — Progress Notes (Signed)
Subjective: Sore but seems to get relief from pain med.  Objective: Vital signs in last 24 hours: Temp:  [97.5 F (36.4 C)-98.5 F (36.9 C)] 98.1 F (36.7 C) (05/15 1307) Pulse Rate:  [73-90] 73 (05/15 1307) Resp:  [18-19] 19 (05/15 1307) BP: (90-130)/(40-64) 98/40 (05/15 1307) SpO2:  [99 %] 99 % (05/15 1307)  Intake/Output from previous day: 05/14 0701 - 05/15 0700 In: 3954.5 [P.O.:522; I.V.:3232.5; IV Piggyback:200] Out: 686 [Urine:230; Drains:431; Blood:25] Intake/Output this shift: Total I/O In: -  Out: 150 [Drains:150]  Operative sites: Mastectomy flaps look good. Tissue expanders appear to be in good position. Drains functioning. Drainage thin.  No results for input(s): WBC, HGB, HCT, NA, K, CL, CO2, BUN, CREATININE, GLU in the last 72 hours.  Invalid input(s): PLATELETS  Studies/Results: No results found.  Assessment/Plan: Doing well. Anxious. Pain appears to be under control at present. No evidence of any skin compromise. Switch to Doxycycline. D/C Ancef. She will need po antibiotic for the nest few weeks.  LOS: 0 days    April Orozco M 04/08/2017 2:33 PM

## 2017-04-09 DIAGNOSIS — C50311 Malignant neoplasm of lower-inner quadrant of right female breast: Secondary | ICD-10-CM | POA: Diagnosis not present

## 2017-04-09 LAB — BASIC METABOLIC PANEL
ANION GAP: 7 (ref 5–15)
BUN: 7 mg/dL (ref 6–20)
CALCIUM: 8.6 mg/dL — AB (ref 8.9–10.3)
CO2: 27 mmol/L (ref 22–32)
CREATININE: 0.71 mg/dL (ref 0.44–1.00)
Chloride: 101 mmol/L (ref 101–111)
Glucose, Bld: 112 mg/dL — ABNORMAL HIGH (ref 65–99)
Potassium: 3.7 mmol/L (ref 3.5–5.1)
SODIUM: 135 mmol/L (ref 135–145)

## 2017-04-09 LAB — CBC
HCT: 34.3 % — ABNORMAL LOW (ref 36.0–46.0)
HEMOGLOBIN: 11.3 g/dL — AB (ref 12.0–15.0)
MCH: 30.2 pg (ref 26.0–34.0)
MCHC: 32.9 g/dL (ref 30.0–36.0)
MCV: 91.7 fL (ref 78.0–100.0)
PLATELETS: 146 10*3/uL — AB (ref 150–400)
RBC: 3.74 MIL/uL — AB (ref 3.87–5.11)
RDW: 13.5 % (ref 11.5–15.5)
WBC: 7.9 10*3/uL (ref 4.0–10.5)

## 2017-04-09 MED ORDER — GABAPENTIN 100 MG PO CAPS
200.0000 mg | ORAL_CAPSULE | Freq: Three times a day (TID) | ORAL | Status: DC
  Administered 2017-04-09 (×2): 200 mg via ORAL
  Filled 2017-04-09 (×2): qty 2

## 2017-04-09 MED ORDER — KETOROLAC TROMETHAMINE 15 MG/ML IJ SOLN
15.0000 mg | Freq: Three times a day (TID) | INTRAMUSCULAR | Status: DC
  Administered 2017-04-09 (×2): 15 mg via INTRAVENOUS
  Filled 2017-04-09 (×2): qty 1

## 2017-04-09 MED ORDER — METHOCARBAMOL 500 MG PO TABS: 500.0000 mg | ORAL_TABLET | Freq: Three times a day (TID) | ORAL | 0 refills | Status: DC

## 2017-04-09 MED ORDER — OXYCODONE HCL 5 MG PO TABS: 5.0000 mg | ORAL_TABLET | ORAL | 0 refills | Status: DC | PRN

## 2017-04-09 MED ORDER — GABAPENTIN 100 MG PO CAPS
200.0000 mg | ORAL_CAPSULE | Freq: Three times a day (TID) | ORAL | 2 refills | Status: DC
Start: 2017-04-09 — End: 2017-06-25

## 2017-04-09 MED ORDER — METHOCARBAMOL 500 MG PO TABS
500.0000 mg | ORAL_TABLET | Freq: Three times a day (TID) | ORAL | Status: DC
  Administered 2017-04-09 (×2): 500 mg via ORAL
  Filled 2017-04-09 (×2): qty 1

## 2017-04-09 MED ORDER — DOXYCYCLINE HYCLATE 100 MG PO TABS: 100.0000 mg | ORAL_TABLET | Freq: Two times a day (BID) | ORAL | 0 refills | Status: DC

## 2017-04-09 NOTE — Discharge Instructions (Signed)
[facility] [place] °[phone] ° °MASTECTOMY: POST OP INSTRUCTIONS ° °Always review your discharge instruction sheet given to you by the facility where your surgery was performed. °IF YOU HAVE DISABILITY OR FAMILY LEAVE FORMS, YOU MUST BRING THEM TO THE OFFICE FOR PROCESSING.   °DO NOT GIVE THEM TO YOUR DOCTOR. °A prescription for pain medication may be given to you upon discharge.  Take your pain medication as prescribed, if needed.  If narcotic pain medicine is not needed, then you may take acetaminophen (Tylenol), naprosyn (Alleve) or ibuprofen (Advil) as needed. °1. Take your usually prescribed medications unless otherwise directed. °2. If you need a refill on your pain medication, please contact your pharmacy.  They will contact our office to request authorization.  Prescriptions will not be filled after 5pm or on week-ends. °3. You should follow a light diet the first few days after arrival home, such as soup and crackers, etc.  Resume your normal diet the day after surgery. °4. Most patients will experience some swelling and bruising on the chest and underarm.  Ice packs will help.  Swelling and bruising can take several days to resolve. Wear the binder day and night until you return to the office.  °5. It is common to experience some constipation if taking pain medication after surgery.  Increasing fluid intake and taking a stool softener (such as Colace) will usually help or prevent this problem from occurring.  A mild laxative (Milk of Magnesia or Miralax) should be taken according to package instructions if there are no bowel movements after 48 hours. °6. Unless discharge instructions indicate otherwise, leave your bandage dry and in place until your next appointment in 3-5 days.  You may take a limited sponge bath.  No tube baths or showers until the drains are removed.  You may have steri-strips (small skin tapes) in place directly over the incision.  These strips should be left on the  skin for 7-10 days. If you have glue it will come off in next couple week.  Any sutures will be removed at an office visit °7. DRAINS:  If you have drains in place, it is important to keep a list of the amount of drainage produced each day in your drains.  Before leaving the hospital, you should be instructed on drain care.  Call our office if you have any questions about your drains. I will remove your drains when they put out less than 30 cc or ml for 2 consecutive days. °8. ACTIVITIES:  You may resume regular (light) daily activities beginning the next day--such as daily self-care, walking, climbing stairs--gradually increasing activities as tolerated.  You may have sexual intercourse when it is comfortable.  Refrain from any heavy lifting or straining until approved by your doctor. °a. You may drive when you are no longer taking prescription pain medication, you can comfortably wear a seatbelt, and you can safely maneuver your car and apply brakes. °b. RETURN TO WORK:  __________________________________________________________ °9. You should see your doctor in the office for a follow-up appointment approximately 3-5 days after your surgery.  Your doctor’s nurse will typically make your follow-up appointment when she calls you with your pathology report.  Expect your pathology report 3-4business days after surgery. °10. OTHER INSTRUCTIONS: ______________________________________________________________________________________________ ____________________________________________________________________________________________ °WHEN TO CALL YOUR DR Madiha Bambrick: °1. Fever over 101.0 °2. Nausea and/or vomiting °3. Extreme swelling or bruising °4. Continued bleeding from incision. °5. Increased pain, redness, or drainage from the incision. °The clinic staff is available   to answer your questions during regular business hours.  Please don’t hesitate to call and ask to speak to one of the nurses for clinical concerns.  If  you have a medical emergency, go to the nearest emergency room or call 911.  A surgeon from [facility] is always on call at the hospital. °[address], [address], [place], [place]  [address] ? [address] [address], [place], [place]   [address] °[phone] ? [phone] ? FAX [phone] °Web site: [email or web address] ° °

## 2017-04-09 NOTE — Progress Notes (Signed)
Pt tolerating oral pain medications.  Pain is better controlled this afternoon.  Pt prefers to go home.  MD office notified.  Danton Clap, RN

## 2017-04-09 NOTE — Progress Notes (Signed)
2 Days Post-Op   Subjective/Chief Complaint: Still with pain on iv meds   Objective: Vital signs in last 24 hours: Temp:  [98.2 F (36.8 C)-98.7 F (37.1 C)] 98.7 F (37.1 C) (05/16 1330) Pulse Rate:  [71-80] 80 (05/16 1330) Resp:  [18-19] 18 (05/16 1330) BP: (98-122)/(45-67) 98/48 (05/16 1330) SpO2:  [96 %-99 %] 96 % (05/16 1330) Last BM Date: 04/09/17  Intake/Output from previous day: 05/15 0701 - 05/16 0700 In: 778 [P.O.:778] Out: 435 [Drains:435] Intake/Output this shift: Total I/O In: 480 [P.O.:480] Out: 90 [Drains:90]  Flaps viable, no hematoma, drains serous  Lab Results:   Recent Labs  04/09/17 0523  WBC 7.9  HGB 11.3*  HCT 34.3*  PLT 146*   BMET  Recent Labs  04/09/17 0523  NA 135  K 3.7  CL 101  CO2 27  GLUCOSE 112*  BUN 7  CREATININE 0.71  CALCIUM 8.6*   PT/INR No results for input(s): LABPROT, INR in the last 72 hours. ABG No results for input(s): PHART, HCO3 in the last 72 hours.  Invalid input(s): PCO2, PO2  Studies/Results: No results found.  Anti-infectives: Anti-infectives    Start     Dose/Rate Route Frequency Ordered Stop   04/08/17 1500  doxycycline (VIBRA-TABS) tablet 100 mg     100 mg Oral Every 12 hours 04/08/17 1432     04/07/17 1900  ceFAZolin (ANCEF) IVPB 2g/100 mL premix  Status:  Discontinued     2 g 200 mL/hr over 30 Minutes Intravenous Every 8 hours 04/07/17 1423 04/08/17 1432   04/07/17 1430  ceFAZolin (ANCEF) IVPB 1 g/50 mL premix  Status:  Discontinued     1 g 100 mL/hr over 30 Minutes Intravenous Every 6 hours 04/07/17 1422 04/07/17 1450   04/07/17 0844  bacitracin 50,000 Units, gentamicin (GARAMYCIN) 80 mg, ceFAZolin (ANCEF) 1 g in sodium chloride 0.9 % 1,000 mL  Status:  Discontinued       As needed 04/07/17 0844 04/07/17 1228   04/07/17 0715  bacitracin 50,000 Units, gentamicin (GARAMYCIN) 80 mg, ceFAZolin (ANCEF) 1 g in sodium chloride 0.9 % 1,000 mL  Status:  Discontinued      Irrigation Once 04/07/17  0712 04/07/17 1422   04/07/17 0608  ceFAZolin (ANCEF) IVPB 2g/100 mL premix     2 g 200 mL/hr over 30 Minutes Intravenous On call to O.R. 04/07/17 0608 04/07/17 1134      Assessment/Plan: POD 2 bilateral ssm, right tad port removal expanders  Pain not well controlled, will attempt scheduled toradol, mm relaxant and oral narcotics Does not want to and not ready for dc due to pain Hopefully home am  [facility] 04/09/2017

## 2017-04-09 NOTE — Progress Notes (Signed)
Pt discharged to home with husband.  PT verbalized  Understanding of discharge instructions and follow up care.  All belongings sent home with pt and husband.  Eduction provided re: incisional care, drain care, pain management, when to call MD and follow up care.  Danton Clap, RN

## 2017-04-09 NOTE — Progress Notes (Signed)
Subjective: Complains of burning.  Objective: Vital signs in last 24 hours: Temp:  [98.1 F (36.7 C)-98.7 F (37.1 C)] 98.6 F (37 C) (05/16 0536) Pulse Rate:  [71-73] 71 (05/16 0536) Resp:  [19] 19 (05/16 0536) BP: (98-122)/(40-67) 107/45 (05/16 0536) SpO2:  [99 %] 99 % (05/16 0536)  Intake/Output from previous day: 05/15 0701 - 05/16 0700 In: 778 [P.O.:778] Out: 435 [Drains:435] Intake/Output this shift: No intake/output data recorded.  Operative sites: Mastectomy flaps viable. No evidence of vascular compromise. Tissue expanders appear okay. Drains functioning. Drainage thin.   Recent Labs  04/09/17 0523  WBC 7.9  HGB 11.3*  HCT 34.3*  NA 135  K 3.7  CL 101  CO2 27  BUN 7  CREATININE 0.71    Studies/Results: No results found.  Assessment/Plan: We had a long talk about pain management. It is time to discontinue the IV morphine. We could try NSAIDs. She understands the risk of bleeding and will think about it.  LOS: 0 days    Crissie Reese M 04/09/2017 7:16 AM

## 2017-04-16 NOTE — Discharge Summary (Signed)
Physician Discharge Summary  Patient ID: April Orozco MRN: [id number] DOB/AGE: 04-20-1966 51 y.o.  Admit date: 04/07/2017 Discharge date: 04/16/2017  Admission Diagnoses: Breast cancer s/p chemotherapy  Discharge Diagnoses:  Active Problems:   Breast cancer of upper-outer quadrant of right female breast [facility])   Discharged Condition: good  Hospital Course: 51 yof who has undergone primary chemotherapy. She desires bilateral mastectomies.  Her mri has partial response and nodes now appear normal. She underwent bilateral ssm with right tad followed by expander reconstruction. She remained in hospital for a couple days for pain control. Otherwise was doing well  Consults: None  Significant Diagnostic Studies: none  Treatments: surgery: bilateral ssm with right tad    Disposition: 01-Home or Self Care  Discharge Instructions    Discharge patient    Complete by:  As directed    Discharge disposition:  01-Home or Self Care   Discharge patient date:  04/09/2017     Allergies as of 04/09/2017      Reactions   Banana Anaphylaxis   Sulfur Hives   Tramadol Rash      Medication List    TAKE these medications   ALPRAZolam 0.5 MG tablet Commonly known as:  XANAX Take 1 tablet (0.5 mg total) by mouth at bedtime as needed for anxiety.   bimatoprost 0.03 % ophthalmic solution Commonly known as:  LATISSE Place 1 application into both eyes daily. Place one drop on applicator and apply evenly along the skin of the upper eyelid at base of eyelashes once daily at bedtime; repeat procedure for second eye (use a clean applicator).   diphenoxylate-atropine 2.5-0.025 MG tablet Commonly known as:  LOMOTIL Take 1-2 tablets by mouth 4 (four) times daily as needed for diarrhea or loose stools.   docusate sodium 50 MG capsule Commonly known as:  COLACE Take 50 mg by mouth 2 (two) times daily.   doxycycline 100 MG tablet Commonly known as:  VIBRA-TABS Take 1 tablet (100 mg total) by  mouth every 12 (twelve) hours.   gabapentin 100 MG capsule Commonly known as:  NEURONTIN Take 2 capsules (200 mg total) by mouth 3 (three) times daily. What changed:  how much to take   ibuprofen 200 MG tablet Commonly known as:  ADVIL,MOTRIN Take 800 mg by mouth every 6 (six) hours as needed for headache, mild pain or moderate pain.   loperamide 2 MG capsule Commonly known as:  IMODIUM Take 2 mg by mouth as needed for diarrhea or loose stools.   magic mouthwash Soln Take 5 mLs by mouth 4 (four) times daily as needed for mouth pain.   methocarbamol 500 MG tablet Commonly known as:  ROBAXIN Take 1 tablet (500 mg total) by mouth 4 (four) times daily -  before meals and at bedtime.   ondansetron 8 MG tablet Commonly known as:  ZOFRAN Take 1 tablet (8 mg total) by mouth 2 (two) times daily as needed for nausea or vomiting.   oxyCODONE 5 MG immediate release tablet Commonly known as:  Oxy IR/ROXICODONE Take 1-2 tablets (5-10 mg total) by mouth every 4 (four) hours as needed for moderate pain.   prochlorperazine 10 MG tablet Commonly known as:  COMPAZINE Take 1 tablet (10 mg total) by mouth every 6 (six) hours as needed for nausea or vomiting.   tamoxifen 20 MG tablet Commonly known as:  NOLVADEX Take 1 tablet (20 mg total) by mouth daily.   zolpidem 5 MG tablet Commonly known as:  AMBIEN Take 1  tablet (5 mg total) by mouth at bedtime as needed for sleep.      Follow-up Information    Rolm Bookbinder, MD Follow up in 1 week(s).   Specialty:  General Surgery Contact information: [address] [place] [address] [phone]        Crissie Reese, MD Follow up.   Specialty:  Plastic Surgery Contact information: [address] [place] [address] [phone]        Crissie Reese, MD Follow up.   Specialty:  Plastic Surgery Contact information: [place] [address] [phone]            Signed: Rolm Bookbinder 04/16/2017, 8:25 AM

## 2017-04-22 NOTE — Progress Notes (Signed)
Left brief message on patient's home phone that did not identify that it was patient voice mail to arrive at [facility]. NPO past midnight. Requested she call [phone] for medications that she can take.

## 2017-04-23 ENCOUNTER — Encounter (HOSPITAL_COMMUNITY)
Admission: RE | Disposition: A | Discharge status: Home / Self Care | Payer: Self-pay | Source: Ambulatory Visit | Attending: General Surgery

## 2017-04-23 ENCOUNTER — Observation Stay (HOSPITAL_COMMUNITY)
Admission: RE | Admit: 2017-04-23 | Discharge: 2017-04-24 | Disposition: A | Discharge status: Home / Self Care | Payer: 59 | Source: Ambulatory Visit | Attending: General Surgery | Admitting: General Surgery

## 2017-04-23 ENCOUNTER — Ambulatory Visit (HOSPITAL_COMMUNITY): Payer: 59 | Admitting: Anesthesiology

## 2017-04-23 ENCOUNTER — Encounter (HOSPITAL_COMMUNITY): Payer: Self-pay | Admitting: *Deleted

## 2017-04-23 DIAGNOSIS — Z8371 Family history of colonic polyps: Secondary | ICD-10-CM | POA: Diagnosis not present

## 2017-04-23 DIAGNOSIS — Z803 Family history of malignant neoplasm of breast: Secondary | ICD-10-CM | POA: Diagnosis not present

## 2017-04-23 DIAGNOSIS — C50411 Malignant neoplasm of upper-outer quadrant of right female breast: Principal | ICD-10-CM | POA: Diagnosis present

## 2017-04-23 DIAGNOSIS — Z808 Family history of malignant neoplasm of other organs or systems: Secondary | ICD-10-CM | POA: Insufficient documentation

## 2017-04-23 DIAGNOSIS — Z87891 Personal history of nicotine dependence: Secondary | ICD-10-CM | POA: Diagnosis not present

## 2017-04-23 DIAGNOSIS — Z8042 Family history of malignant neoplasm of prostate: Secondary | ICD-10-CM | POA: Insufficient documentation

## 2017-04-23 DIAGNOSIS — Z888 Allergy status to other drugs, medicaments and biological substances status: Secondary | ICD-10-CM | POA: Diagnosis not present

## 2017-04-23 DIAGNOSIS — Z79899 Other long term (current) drug therapy: Secondary | ICD-10-CM | POA: Diagnosis not present

## 2017-04-23 DIAGNOSIS — Z800 Family history of malignant neoplasm of digestive organs: Secondary | ICD-10-CM | POA: Insufficient documentation

## 2017-04-23 DIAGNOSIS — Z882 Allergy status to sulfonamides status: Secondary | ICD-10-CM | POA: Insufficient documentation

## 2017-04-23 DIAGNOSIS — Z832 Family history of diseases of the blood and blood-forming organs and certain disorders involving the immune mechanism: Secondary | ICD-10-CM | POA: Diagnosis not present

## 2017-04-23 HISTORY — PX: AXILLARY LYMPH NODE DISSECTION: SHX5229

## 2017-04-23 HISTORY — PX: BREAST LUMPECTOMY WITH AXILLARY LYMPH NODE BIOPSY: SHX5593

## 2017-04-23 SURGERY — LYMPHADENECTOMY, AXILLARY
Anesthesia: General | Site: Axilla | Laterality: Right

## 2017-04-23 MED ORDER — DIPHENHYDRAMINE HCL 25 MG PO CAPS
25.0000 mg | ORAL_CAPSULE | Freq: Four times a day (QID) | ORAL | Status: DC | PRN
  Administered 2017-04-23 – 2017-04-24 (×4): 25 mg via ORAL
  Filled 2017-04-23 (×4): qty 1

## 2017-04-23 MED ORDER — ENOXAPARIN SODIUM 40 MG/0.4ML ~~LOC~~ SOLN
40.0000 mg | SUBCUTANEOUS | Status: DC
  Administered 2017-04-24: 40 mg via SUBCUTANEOUS
  Filled 2017-04-23: qty 0.4

## 2017-04-23 MED ORDER — LACTATED RINGERS IV SOLN
INTRAVENOUS | Status: DC
  Administered 2017-04-23: 09:00:00 via INTRAVENOUS

## 2017-04-23 MED ORDER — METHOCARBAMOL 500 MG PO TABS
500.0000 mg | ORAL_TABLET | Freq: Four times a day (QID) | ORAL | Status: DC | PRN
  Administered 2017-04-23 – 2017-04-24 (×3): 500 mg via ORAL
  Filled 2017-04-23 (×3): qty 1

## 2017-04-23 MED ORDER — HEMOSTATIC AGENTS (NO CHARGE) OPTIME
TOPICAL | Status: DC | PRN
  Administered 2017-04-23: 1 via TOPICAL

## 2017-04-23 MED ORDER — BUPIVACAINE HCL (PF) 0.25 % IJ SOLN
INTRAMUSCULAR | Status: AC
  Filled 2017-04-23: qty 30

## 2017-04-23 MED ORDER — BUPIVACAINE HCL (PF) 0.25 % IJ SOLN
INTRAMUSCULAR | Status: DC | PRN
Start: 2017-04-23 — End: 2017-04-23
  Administered 2017-04-23: 4.5 mL

## 2017-04-23 MED ORDER — ONDANSETRON 4 MG PO TBDP: 4.0000 mg | ORAL_TABLET | Freq: Four times a day (QID) | ORAL | Status: DC | PRN

## 2017-04-23 MED ORDER — LIDOCAINE HCL (CARDIAC) 20 MG/ML IV SOLN
INTRAVENOUS | Status: DC | PRN
Start: 2017-04-23 — End: 2017-04-23
  Administered 2017-04-23: 20 mg via INTRATRACHEAL

## 2017-04-23 MED ORDER — EPHEDRINE 5 MG/ML INJ
INTRAVENOUS | Status: AC
  Filled 2017-04-23: qty 10

## 2017-04-23 MED ORDER — BUPIVACAINE-EPINEPHRINE (PF) 0.5% -1:200000 IJ SOLN
INTRAMUSCULAR | Status: DC | PRN
  Administered 2017-04-23: 25 mL

## 2017-04-23 MED ORDER — ONDANSETRON HCL 4 MG/2ML IJ SOLN: 4.0000 mg | Freq: Four times a day (QID) | INTRAMUSCULAR | Status: DC | PRN

## 2017-04-23 MED ORDER — PROPOFOL 10 MG/ML IV BOLUS
INTRAVENOUS | Status: DC | PRN
  Administered 2017-04-23: 150 mg via INTRAVENOUS
  Administered 2017-04-23: 50 mg via INTRAVENOUS

## 2017-04-23 MED ORDER — OXYCODONE HCL 5 MG PO TABS
ORAL_TABLET | ORAL | Status: AC
  Filled 2017-04-23: qty 2

## 2017-04-23 MED ORDER — LIDOCAINE 2% (20 MG/ML) 5 ML SYRINGE
INTRAMUSCULAR | Status: AC
  Filled 2017-04-23: qty 10

## 2017-04-23 MED ORDER — SIMETHICONE 80 MG PO CHEW: 40.0000 mg | CHEWABLE_TABLET | Freq: Four times a day (QID) | ORAL | Status: DC | PRN

## 2017-04-23 MED ORDER — MIDAZOLAM HCL 2 MG/2ML IJ SOLN
INTRAMUSCULAR | Status: DC | PRN
  Administered 2017-04-23: 2 mg via INTRAVENOUS

## 2017-04-23 MED ORDER — HYDROMORPHONE HCL 1 MG/ML IJ SOLN
0.2500 mg | INTRAMUSCULAR | Status: DC | PRN
  Administered 2017-04-23 (×2): 0.25 mg via INTRAVENOUS
  Administered 2017-04-23 (×2): 0.5 mg via INTRAVENOUS

## 2017-04-23 MED ORDER — HYDROMORPHONE HCL 1 MG/ML IJ SOLN
INTRAMUSCULAR | Status: AC
  Administered 2017-04-23: 0.5 mg via INTRAVENOUS
  Filled 2017-04-23: qty 1

## 2017-04-23 MED ORDER — CEFAZOLIN SODIUM 1 G IJ SOLR
INTRAMUSCULAR | Status: AC
  Filled 2017-04-23: qty 20

## 2017-04-23 MED ORDER — SODIUM CHLORIDE 0.9 % IV SOLN
INTRAVENOUS | Status: DC
  Administered 2017-04-23 – 2017-04-24 (×2): via INTRAVENOUS

## 2017-04-23 MED ORDER — CEFAZOLIN SODIUM-DEXTROSE 2-4 GM/100ML-% IV SOLN
2.0000 g | INTRAVENOUS | Status: AC
  Administered 2017-04-23: 2 g via INTRAVENOUS
  Filled 2017-04-23: qty 100

## 2017-04-23 MED ORDER — SUCCINYLCHOLINE CHLORIDE 200 MG/10ML IV SOSY
PREFILLED_SYRINGE | INTRAVENOUS | Status: AC
  Filled 2017-04-23: qty 10

## 2017-04-23 MED ORDER — GABAPENTIN 100 MG PO CAPS
200.0000 mg | ORAL_CAPSULE | Freq: Three times a day (TID) | ORAL | Status: DC
  Administered 2017-04-23 – 2017-04-24 (×3): 200 mg via ORAL
  Filled 2017-04-23 (×3): qty 2

## 2017-04-23 MED ORDER — FENTANYL CITRATE (PF) 250 MCG/5ML IJ SOLN
INTRAMUSCULAR | Status: DC | PRN
  Administered 2017-04-23 (×2): 50 ug via INTRAVENOUS
  Administered 2017-04-23 (×3): 25 ug via INTRAVENOUS
  Administered 2017-04-23: 50 ug via INTRAVENOUS
  Administered 2017-04-23: 25 ug via INTRAVENOUS

## 2017-04-23 MED ORDER — HYDROMORPHONE HCL 1 MG/ML IJ SOLN
INTRAMUSCULAR | Status: AC
  Filled 2017-04-23: qty 0.5

## 2017-04-23 MED ORDER — ROCURONIUM BROMIDE 10 MG/ML (PF) SYRINGE
PREFILLED_SYRINGE | INTRAVENOUS | Status: AC
  Filled 2017-04-23: qty 10

## 2017-04-23 MED ORDER — PROMETHAZINE HCL 25 MG/ML IJ SOLN: 6.2500 mg | INTRAMUSCULAR | Status: DC | PRN

## 2017-04-23 MED ORDER — MIDAZOLAM HCL 2 MG/2ML IJ SOLN
INTRAMUSCULAR | Status: AC
  Administered 2017-04-23: 2 mg
  Filled 2017-04-23: qty 2

## 2017-04-23 MED ORDER — HYDROMORPHONE HCL 1 MG/ML IJ SOLN
1.0000 mg | INTRAMUSCULAR | Status: DC | PRN
  Administered 2017-04-23 – 2017-04-24 (×6): 1 mg via INTRAVENOUS
  Filled 2017-04-23 (×6): qty 1

## 2017-04-23 MED ORDER — PROPOFOL 10 MG/ML IV BOLUS
INTRAVENOUS | Status: AC
  Filled 2017-04-23: qty 20

## 2017-04-23 MED ORDER — ALPRAZOLAM 0.5 MG PO TABS: 0.5000 mg | ORAL_TABLET | Freq: Every evening | ORAL | Status: DC | PRN

## 2017-04-23 MED ORDER — FENTANYL CITRATE (PF) 100 MCG/2ML IJ SOLN
INTRAMUSCULAR | Status: AC
  Administered 2017-04-23: 100 ug
  Filled 2017-04-23: qty 2

## 2017-04-23 MED ORDER — METHOCARBAMOL 500 MG PO TABS
ORAL_TABLET | ORAL | Status: AC
  Administered 2017-04-23: 500 mg
  Filled 2017-04-23: qty 1

## 2017-04-23 MED ORDER — PHENYLEPHRINE HCL 10 MG/ML IJ SOLN
INTRAVENOUS | Status: DC | PRN
  Administered 2017-04-23: 20 ug/min via INTRAVENOUS

## 2017-04-23 MED ORDER — ONDANSETRON HCL 4 MG/2ML IJ SOLN
INTRAMUSCULAR | Status: DC | PRN
  Administered 2017-04-23: 4 mg via INTRAVENOUS

## 2017-04-23 MED ORDER — OXYCODONE HCL 5 MG PO TABS
5.0000 mg | ORAL_TABLET | ORAL | Status: DC | PRN
  Administered 2017-04-23 (×2): 10 mg via ORAL
  Administered 2017-04-24: 5 mg via ORAL
  Administered 2017-04-24: 10 mg via ORAL
  Filled 2017-04-23: qty 1
  Filled 2017-04-23 (×3): qty 2

## 2017-04-23 MED ORDER — DOXYCYCLINE HYCLATE 100 MG PO TABS
100.0000 mg | ORAL_TABLET | Freq: Two times a day (BID) | ORAL | Status: DC
  Administered 2017-04-23 – 2017-04-24 (×2): 100 mg via ORAL
  Filled 2017-04-23 (×2): qty 1

## 2017-04-23 MED ORDER — FENTANYL CITRATE (PF) 250 MCG/5ML IJ SOLN
INTRAMUSCULAR | Status: AC
  Filled 2017-04-23: qty 5

## 2017-04-23 MED ORDER — MIDAZOLAM HCL 2 MG/2ML IJ SOLN
INTRAMUSCULAR | Status: AC
  Filled 2017-04-23: qty 2

## 2017-04-23 SURGICAL SUPPLY — 60 items
APPLIER CLIP 9.375 MED OPEN (MISCELLANEOUS) ×3
BINDER BREAST MEDIUM (GAUZE/BANDAGES/DRESSINGS) ×3 IMPLANT
BINDER BREAST XLRG (GAUZE/BANDAGES/DRESSINGS) ×3 IMPLANT
BIOPATCH BLUE 3/4IN DISK W/1.5 (GAUZE/BANDAGES/DRESSINGS) ×3 IMPLANT
BLADE CLIPPER SURG (BLADE) IMPLANT
CANISTER SUCT 3000ML PPV (MISCELLANEOUS) ×3 IMPLANT
CHLORAPREP W/TINT 26ML (MISCELLANEOUS) ×3 IMPLANT
CLIP APPLIE 9.375 MED OPEN (MISCELLANEOUS) ×1 IMPLANT
CONT SPEC 4OZ CLIKSEAL STRL BL (MISCELLANEOUS) ×3 IMPLANT
COVER SURGICAL LIGHT HANDLE (MISCELLANEOUS) ×3 IMPLANT
DECANTER SPIKE VIAL GLASS SM (MISCELLANEOUS) ×3 IMPLANT
DERMABOND ADVANCED (GAUZE/BANDAGES/DRESSINGS) ×2
DERMABOND ADVANCED .7 DNX12 (GAUZE/BANDAGES/DRESSINGS) ×1 IMPLANT
DRAIN CHANNEL 19F RND (DRAIN) ×3 IMPLANT
DRAPE LAPAROTOMY 100X72 PEDS (DRAPES) ×3 IMPLANT
DRSG PAD ABDOMINAL 8X10 ST (GAUZE/BANDAGES/DRESSINGS) ×3 IMPLANT
DRSG TEGADERM 4X4.75 (GAUZE/BANDAGES/DRESSINGS) ×3 IMPLANT
ELECT CAUTERY BLADE 6.4 (BLADE) ×3 IMPLANT
ELECT REM PT RETURN 9FT ADLT (ELECTROSURGICAL) ×3
ELECTRODE REM PT RTRN 9FT ADLT (ELECTROSURGICAL) ×1 IMPLANT
EVACUATOR SILICONE 100CC (DRAIN) ×3 IMPLANT
GAUZE SPONGE 4X4 16PLY XRAY LF (GAUZE/BANDAGES/DRESSINGS) ×3 IMPLANT
GLOVE BIO SURGEON STRL SZ7 (GLOVE) ×3 IMPLANT
GLOVE BIOGEL PI IND STRL 7.5 (GLOVE) ×1 IMPLANT
GLOVE BIOGEL PI INDICATOR 7.5 (GLOVE) ×2
GLOVE ECLIPSE 6.0 STRL STRAW (GLOVE) ×3 IMPLANT
GLOVE ECLIPSE 6.5 STRL STRAW (GLOVE) ×3 IMPLANT
GLOVE INDICATOR 7.0 STRL GRN (GLOVE) ×3 IMPLANT
GOWN STRL REUS W/ TWL LRG LVL3 (GOWN DISPOSABLE) ×3 IMPLANT
GOWN STRL REUS W/TWL LRG LVL3 (GOWN DISPOSABLE) ×6
HEMOSTAT ARISTA ABSORB 3G PWDR (MISCELLANEOUS) ×3 IMPLANT
KIT BASIN OR (CUSTOM PROCEDURE TRAY) ×3 IMPLANT
KIT ROOM TURNOVER OR (KITS) ×3 IMPLANT
NEEDLE HYPO 25GX1X1/2 BEV (NEEDLE) ×3 IMPLANT
NS IRRIG 1000ML POUR BTL (IV SOLUTION) ×3 IMPLANT
PACK SURGICAL SETUP 50X90 (CUSTOM PROCEDURE TRAY) ×3 IMPLANT
PAD ARMBOARD 7.5X6 YLW CONV (MISCELLANEOUS) ×6 IMPLANT
PENCIL BUTTON HOLSTER BLD 10FT (ELECTRODE) ×3 IMPLANT
SPONGE LAP 4X18 X RAY DECT (DISPOSABLE) ×3 IMPLANT
STAPLER VISISTAT 35W (STAPLE) ×3 IMPLANT
STOCKINETTE IMPERVIOUS 9X36 MD (GAUZE/BANDAGES/DRESSINGS) ×3 IMPLANT
SUT ETHILON 3 0 PS 1 (SUTURE) ×3 IMPLANT
SUT MNCRL AB 4-0 PS2 18 (SUTURE) ×3 IMPLANT
SUT SILK 3 0 (SUTURE) ×2
SUT SILK 3-0 18XBRD TIE 12 (SUTURE) ×1 IMPLANT
SUT VIC AB 2-0 CT1 27 (SUTURE) ×2
SUT VIC AB 2-0 CT1 TAPERPNT 27 (SUTURE) ×1 IMPLANT
SUT VIC AB 2-0 SH 27 (SUTURE) ×2
SUT VIC AB 2-0 SH 27X BRD (SUTURE) ×1 IMPLANT
SUT VIC AB 3-0 SH 27 (SUTURE) ×2
SUT VIC AB 3-0 SH 27X BRD (SUTURE) ×1 IMPLANT
SUT VIC AB 4-0 PS2 27 (SUTURE) ×3 IMPLANT
SYR BULB 3OZ (MISCELLANEOUS) ×3 IMPLANT
SYR CONTROL 10ML LL (SYRINGE) ×3 IMPLANT
TOWEL OR 17X24 6PK STRL BLUE (TOWEL DISPOSABLE) ×3 IMPLANT
TOWEL OR 17X26 10 PK STRL BLUE (TOWEL DISPOSABLE) ×3 IMPLANT
TUBE CONNECTING 12'X1/4 (SUCTIONS) ×1
TUBE CONNECTING 12X1/4 (SUCTIONS) ×2 IMPLANT
WATER STERILE IRR 1000ML POUR (IV SOLUTION) IMPLANT
YANKAUER SUCT BULB TIP NO VENT (SUCTIONS) ×3 IMPLANT

## 2017-04-23 NOTE — Op Note (Signed)
Preoperative diagnosis: right breast cancer clinical stage II s/p primary chemotherapy Postoperative diagnosis: same as above Procedure: right completion axillary lymph node dissection Surgeon: Dr. Serita Grammes Anesthesia: Gen. with pectoral block  Specimens: right axillary tissue Estimated blood loss: Minimal Drains: 19 Fr Blake drain to right axilla Complications: None Sponge count was correct at completion Disposition to recovery stable  Indications: This is a 9 yof with clinical stage II right breast cancer that was node positive on diagnosis. She has undergone primary chemotherapy which was stopped due to side effects.  Her mri and exam show no abnormal right axillary nodes now.  Her breast cancer has a moderate response.  She underwent bilateral with right tad.  3/6 nodes are positive and I discussed with her returning to or for completion alnd although I dont think there is much more tissue left.  We discussed the risk of lymphedema.   Procedure: After informed consent was obtained the patient was taken to the operating room. She had undergone a pectoral block. She received antibiotics. SCDs were in place. She was placed under general anesthesia without complication. She was prepped and draped in the standard sterile surgical fashion. Surgical timeout was then performed.  I made an incision below the right axillary hairline.  I dissected through the axillary fascia. The entire operation was very difficult due to prior surgery. I was able to identify the border of the pectoralis. I eventually was able to identify the axillary vein, thoracodorsal bundle and the long thoracic nerve. These structures were preserved.  I then swept the remaining tissue overlying the muscle caudad.  There was not much tissue left after the targeted dissection.  This was all passed off.  There is no more tissue in level one and level two of axilla.  I obtained hemostasis. I placed a 19 Fr Blake drain and secured  this with a 2-0 nylon.  I then placed arista in the cavity. I closed with 2-0 vicryl, 3-0 vicryl and 4-0 monocryl. Glue and steristrips were placed over the wound.  I placed a biopatch and tegaderm at drain site. A binder was placed. She tolerated well and was transferred to the pacu stable.

## 2017-04-23 NOTE — Anesthesia Preprocedure Evaluation (Addendum)
Anesthesia Evaluation  Patient identified by MRN, date of birth, ID band Patient awake    Reviewed: Allergy & Precautions, NPO status , Patient's Chart, lab work & pertinent test results  Airway Mallampati: I  TM Distance: >3 FB Neck ROM: Full    Dental  (+) Upper Dentures, Lower Dentures   Pulmonary neg pulmonary ROS,    Pulmonary exam normal breath sounds clear to auscultation       Cardiovascular Normal cardiovascular exam+ Valvular Problems/Murmurs  Rhythm:Regular Rate:Normal  Normal LV size with EF 60-65%. Normal diastolic function. Normal  RV size and systolic function. Aortic valve poorly visualized,  mild turbulence across it but no stenosis.   Neuro/Psych Anxiety negative neurological ROS     GI/Hepatic negative GI ROS, Neg liver ROS,   Endo/Other  negative endocrine ROS  Renal/GU negative Renal ROS     Musculoskeletal negative musculoskeletal ROS (+)   Abdominal Normal abdominal exam  (+)   Peds  Hematology negative hematology ROS (+)   Anesthesia Other Findings   Reproductive/Obstetrics                            Lab Results  Component Value Date   WBC 7.9 04/09/2017   HGB 11.3 (L) 04/09/2017   HCT 34.3 (L) 04/09/2017   MCV 91.7 04/09/2017   PLT 146 (L) 04/09/2017   Lab Results  Component Value Date   CREATININE 0.71 04/09/2017   BUN 7 04/09/2017   NA 135 04/09/2017   K 3.7 04/09/2017   CL 101 04/09/2017   CO2 27 04/09/2017    Anesthesia Physical  Anesthesia Plan  ASA: II  Anesthesia Plan: General   Post-op Pain Management:  Regional for Post-op pain   Induction: Intravenous  Airway Management Planned: LMA  Additional Equipment:   Intra-op Plan:   Post-operative Plan: Extubation in OR  Informed Consent: I have reviewed the patients History and Physical, chart, labs and discussed the procedure including the risks, benefits and alternatives for the  proposed anesthesia with the patient or authorized representative who has indicated his/her understanding and acceptance.     Plan Discussed with: Surgeon and CRNA  Anesthesia Plan Comments:        Anesthesia Quick Evaluation

## 2017-04-23 NOTE — Anesthesia Procedure Notes (Signed)
Procedure Name: LMA Insertion Date/Time: 04/23/2017 10:56 AM Performed by: Julieta Bellini Pre-anesthesia Checklist: Patient identified, Emergency Drugs available, Suction available and Patient being monitored Patient Re-evaluated:Patient Re-evaluated prior to inductionOxygen Delivery Method: Circle system utilized Preoxygenation: Pre-oxygenation with 100% oxygen Intubation Type: IV induction Ventilation: Mask ventilation without difficulty LMA: LMA inserted LMA Size: 4.0 Number of attempts: 1 Placement Confirmation: positive ETCO2 and breath sounds checked- equal and bilateral Tube secured with: Tape Dental Injury: Teeth and Oropharynx as per pre-operative assessment

## 2017-04-23 NOTE — Anesthesia Procedure Notes (Signed)
Anesthesia Regional Block: Pectoralis block   Pre-Anesthetic Checklist: ,, timeout performed, Correct Patient, Correct Site, Correct Laterality, Correct Procedure, Correct Position, site marked, Risks and benefits discussed,  Surgical consent,  Pre-op evaluation,  At surgeon's request and post-op pain management  Laterality: Right  Prep: chloraprep       Needles:  Injection technique: Single-shot  Needle Type: Echogenic Needle     Needle Length: 9cm  Needle Gauge: 21     Additional Needles:   Procedures: ultrasound guided,,,,,,,,  Narrative:  Start time: 04/23/2017 10:30 AM End time: 04/23/2017 10:38 AM Injection made incrementally with aspirations every 5 mL.  Performed by: Personally  Anesthesiologist: Suzette Battiest

## 2017-04-23 NOTE — Discharge Instructions (Signed)
[facility] °[phone] ° ° POST OP INSTRUCTIONS ° °Always review your discharge instruction sheet given to you by the facility where your surgery was performed. °IF YOU HAVE DISABILITY OR FAMILY LEAVE FORMS, YOU MUST BRING THEM TO THE OFFICE FOR PROCESSING.   °DO NOT GIVE THEM TO YOUR DOCTOR. °A prescription for pain medication may be given to you upon discharge.  Take your pain medication as prescribed, if needed.  If narcotic pain medicine is not needed, then you may take acetaminophen (Tylenol), naprosyn (Alleve) or ibuprofen (Advil) as needed. °1. Take your usually prescribed medications unless otherwise directed. °2. If you need a refill on your pain medication, please contact your pharmacy.  They will contact our office to request authorization.  Prescriptions will not be filled after 5pm or on week-ends. °3. You should follow a light diet the first few days after arrival home, such as soup and crackers, etc.  Resume your normal diet the day after surgery. °4. Most patients will experience some swelling and bruising on the chest and underarm.  Ice packs will help.  Swelling and bruising can take several days to resolve. Wear the binder day and night until you return to the office.  °5. It is common to experience some constipation if taking pain medication after surgery.  Increasing fluid intake and taking a stool softener (such as Colace) will usually help or prevent this problem from occurring.  A mild laxative (Milk of Magnesia or Miralax) should be taken according to package instructions if there are no bowel movements after 48 hours. °6. Unless discharge instructions indicate otherwise, leave your bandage dry and in place until your next appointment in 3-5 days.  You may take a limited sponge bath.  No tube baths or showers until the drains are removed.  You may have steri-strips (small skin tapes) in place directly over the incision.  These strips should be left on the skin for  7-10 days. If you have glue it will come off in next couple week.  Any sutures will be removed at an office visit °7. DRAINS:  If you have drains in place, it is important to keep a list of the amount of drainage produced each day in your drains.  Before leaving the hospital, you should be instructed on drain care.  Call our office if you have any questions about your drains. I will remove your drains when they put out less than 30 cc or ml for 2 consecutive days. °8. ACTIVITIES:  You may resume regular (light) daily activities beginning the next day--such as daily self-care, walking, climbing stairs--gradually increasing activities as tolerated.  You may have sexual intercourse when it is comfortable.  Refrain from any heavy lifting or straining until approved by your doctor. °a. You may drive when you are no longer taking prescription pain medication, you can comfortably wear a seatbelt, and you can safely maneuver your car and apply brakes. °b. RETURN TO WORK:  __________________________________________________________ °9. You should see your doctor in the office for a follow-up appointment approximately 3-5 days after your surgery.  Your doctor’s nurse will typically make your follow-up appointment when she calls you with your pathology report.  Expect your pathology report 3-4business days after surgery. °10. OTHER INSTRUCTIONS: ______________________________________________________________________________________________ ____________________________________________________________________________________________ °WHEN TO CALL YOUR DR Samarie Pinder: °1. Fever over 101.0 °2. Nausea and/or vomiting °3. Extreme swelling or bruising °4. Continued bleeding from incision. °5. Increased pain, redness, or drainage from the incision. °The clinic staff is available   to answer your questions during regular business hours.  Please don’t hesitate to call and ask to speak to one of the nurses for clinical concerns.  If you have a  medical emergency, go to the nearest emergency room or call 911.  A surgeon from [facility] is always on call at the hospital. °[address], [address], [place], [place]  [address] ? [address] [address], [place], [place]   [address] °[phone] ? [phone] ? FAX [phone] °Web site: [email or web address] ° °

## 2017-04-23 NOTE — Interval H&P Note (Signed)
History and Physical Interval Note:  04/23/2017 10:14 AM  April Orozco  has presented today for surgery, with the diagnosis of RIGHT BREAST CANCER  The various methods of treatment have been discussed with the patient and family. After consideration of risks, benefits and other options for treatment, the patient has consented to  Procedure(s): RIGHT AXILLARY LYMPH NODE DISSECTION (Right) as a surgical intervention .  The patient's history has been reviewed, patient examined, no change in status, stable for surgery.  I have reviewed the patient's chart and labs.  Questions were answered to the patient's satisfaction.     Lucan Riner

## 2017-04-23 NOTE — Anesthesia Postprocedure Evaluation (Signed)
Anesthesia Post Note  Patient: April Orozco  Procedure(s) Performed: Procedure(s) (LRB): RIGHT AXILLARY LYMPH NODE DISSECTION (Right)  Patient location during evaluation: PACU Anesthesia Type: General Level of consciousness: awake and alert Pain management: pain level controlled Vital Signs Assessment: post-procedure vital signs reviewed and stable Respiratory status: spontaneous breathing, nonlabored ventilation, respiratory function stable and patient connected to nasal cannula oxygen Cardiovascular status: blood pressure returned to baseline and stable Postop Assessment: no signs of nausea or vomiting Anesthetic complications: no       Last Vitals:  Vitals:   04/23/17 1435 04/23/17 1500  BP: 99/71 107/74  Pulse:    Resp:  16  Temp:  37.1 C    Last Pain:  Vitals:   04/23/17 1500  TempSrc: Oral  PainSc: 6                  [name]

## 2017-04-23 NOTE — H&P (View-Only) (Signed)
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 she has learned that she has three 1st cousins with breast cancer all around the age of 4. she underwent mm that showed c density breasts. there is a 2.6 cm oval mass at 9 oclock in the right breast. there was also a 6 mm left breast area that ends up being benign tissue. US showed a 2.7 cm mass with some skin thickening and a lobulated axillary node. 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 she has no discharge. mri showed a 2.7x2.4x2.5 cm mass in right breast with no abnl nodes, no malignancy in left breast or other disease on right. 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 f/u mri shows a 1.5 cm right breast mass and normal nodes. she is here today to discuss surgery. she is adamant about wanting double mastectomies. her genetics is negative and she has already seen plastic surgery   Past Surgical History Rolm Bookbinder, MD; 03/10/2017 12:22 PM) Appendectomy  Breast Biopsy  Right. Tonsillectomy   Diagnostic Studies History Rolm Bookbinder, MD; 03/10/2017 12:22 PM) Colonoscopy  never Mammogram  within last year Pap Smear  >5 years ago  Allergies Rolm Bookbinder, MD; 03/10/2017 12:22 PM) Sulfa Antibiotics  Hives. Tramadol  Rash.  Medication History Rolm Bookbinder, MD; 03/10/2017 12:22 PM) Medications Reconciled Flomax (0.4MG Capsule, Oral) Active. Albuterol (90MCG/ACT Aerosol Soln, Inhalation) Active. Oxycodone-Acetaminophen (5-325MG Tablet, Oral as needed) Active. Xanax (0.5MG Tablet, Oral daily) Active. (Take one tablet at bedtime.) Compazine (10MG Tablet, Oral as needed) Active. Zofran (8MG Tablet, Oral as needed) Active. Ambien (5MG Tablet, Oral daily) Active.  Social History Rolm Bookbinder, MD; 03/10/2017 12:22 PM) Alcohol use   Occasional alcohol use. Caffeine use  Coffee. No drug use  Tobacco use  Former smoker.  Family History Rolm Bookbinder, MD; 03/10/2017 12:22 PM) Bleeding disorder  Mother. Breast Cancer  Family Members In General. Colon Cancer  Family Members In General. Colon Polyps  Family Members In General. Hypertension  Father, Mother. Melanoma  Father. Prostate Cancer  Family Members In General.  Vitals Lars Mage Spillers CMA; 03/10/2017 10:38 AM) 03/10/2017 10:37 AM Weight: 182 lb Height: 65in Body Surface Area: 1.9 m Body Mass Index: 30.29 kg/m  BP: 132/78 (Sitting, Left Arm, Standard) Physical Exam Rolm Bookbinder MD; 03/10/2017 12:23 PM) General Mental Status-Alert. Orientation-Oriented X3. Head and Neck Thyroid -Note: no thyroid mass. Eye Sclera/Conjunctiva - Bilateral-No scleral icterus. Chest and Lung Exam Chest and lung exam reveals -on auscultation, normal breath sounds, no adventitious sounds and normal vocal resonance. Breast Nipples-No Discharge. Note: <2 cm right loq breast mass, mobile, nontender No enlarged axillary nodes Cardiovascular Cardiovascular examination reveals -normal heart sounds, regular rate and rhythm with no murmurs. Lymphatic Head & Neck General Head & Neck Lymphatics: Bilateral - Description - Normal. Axillary General Axillary Region: Bilateral - Description - Normal. 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 I told her directly I did not recommend this and specifically it provides no advantage for her cancer in terms of local or systemic recurrence or prevention on the other side. She understands mastectomy has up to  5% risk of recurrence or primary cancer on even  unaffected side. she also understands that her quality of life and ability to work will be less with this surgery as opposed to [facility]. Her genes are negative. we will plan on right ssm (she was told by plastics to not pursue nsm) and prophylactic left ssm. she understands risks. she understands will likely need radiation on the right. we discussed right axillary sentinel node and seed guided excision of pos node (TAD) on the right side as well. she understands risk of lymphedema with this. she also would like port removal although understands there is small chance of her 2 pos disease on repeat.

## 2017-04-23 NOTE — Transfer of Care (Signed)
Immediate Anesthesia Transfer of Care Note  Patient: April Orozco  Procedure(s) Performed: Procedure(s): RIGHT AXILLARY LYMPH NODE DISSECTION (Right)  Patient Location: PACU  Anesthesia Type:General and GA combined with regional for post-op pain  Level of Consciousness: awake, alert , oriented and patient cooperative  Airway & Oxygen Therapy: Patient Spontanous Breathing  Post-op Assessment: Report given to RN, Post -op Vital signs reviewed and stable and Patient moving all extremities X 4  Post vital signs: Reviewed and stable  Last Vitals:  Vitals:   04/23/17 0906  BP: (!) 106/56  Pulse: 81  Resp: 18  Temp: 36.8 C    Last Pain:  Vitals:   04/23/17 0906  TempSrc: Oral  PainSc:       Patients Stated Pain Goal: 5 (57/50/51 8335)  Complications: No apparent anesthesia complications

## 2017-04-24 ENCOUNTER — Encounter (HOSPITAL_COMMUNITY): Payer: Self-pay | Admitting: General Surgery

## 2017-04-24 DIAGNOSIS — C50411 Malignant neoplasm of upper-outer quadrant of right female breast: Secondary | ICD-10-CM | POA: Diagnosis not present

## 2017-04-24 MED ORDER — OXYCODONE HCL 5 MG PO TABS: 5.0000 mg | ORAL_TABLET | ORAL | 0 refills | Status: DC | PRN

## 2017-04-24 MED ORDER — FLUCONAZOLE 100 MG PO TABS
200.0000 mg | ORAL_TABLET | Freq: Once | ORAL | Status: AC
  Administered 2017-04-24: 200 mg via ORAL
  Filled 2017-04-24: qty 2

## 2017-04-24 NOTE — Progress Notes (Signed)
Right under arm incision with steri strips dry and intact. Pain is better this morning. Pt tolerating diet. Pt knows how to empty drains. Discharge instructions given, verbalized understanding. Discharged to home accompanied by spouse.

## 2017-04-24 NOTE — Discharge Summary (Signed)
Physician Discharge Summary  Patient ID: April Orozco MRN: [id number] DOB/AGE: 1965-11-30 51 y.o.  Admit date: 04/23/2017 Discharge date: 04/24/2017  Admission Diagnoses: Breast cancer  Discharge Diagnoses:  Active Problems:   Breast cancer of upper-outer quadrant of right female breast [facility])   Discharged Condition: good  Hospital Course: 19 yof with right breast cancer s/p ssm with expanders.  She had 3/6 positive nodes and we recommended alnd.  I took her to or and did completion alnd after TAD.  She has done well and will be discharged home  Consults: None  Significant Diagnostic Studies: none  Treatments: surgery: right alnd   Discharge Exam: Blood pressure (!) 108/58, pulse 89, temperature 98.9 F (37.2 C), temperature source Oral, resp. rate 16, height 5\' 5"  (1.651 m), weight 82.1 kg (181 lb), SpO2 99 %. Drains serosang as expected   Disposition: 01-Home or Self Care    Follow-up Information    Rolm Bookbinder, MD Follow up in 1 week(s).   Specialty:  General Surgery Contact information: [address] [place] [address] [phone]           Signed: Rolm Bookbinder 04/24/2017, 7:36 AM

## 2017-04-25 ENCOUNTER — Encounter (HOSPITAL_COMMUNITY): Payer: Self-pay | Admitting: General Surgery

## 2017-04-25 NOTE — OR Nursing (Signed)
Late entry due to delay code documentation. 

## 2017-04-28 ENCOUNTER — Ambulatory Visit: Payer: 59

## 2017-04-28 ENCOUNTER — Institutional Professional Consult (permissible substitution): Payer: Self-pay | Admitting: Radiation Oncology

## 2017-05-02 NOTE — Progress Notes (Signed)
Location of Breast Cancer:Right lower -outer quadrant in female  Histology per Pathology Report:04-23-17  Diagnosis 04-23-17 Lymph nodes, regional resection, Right Axillary Contents - NO CARCINOMA IDENTIFIED IN EIGHT LYMPH NODES (0/8)  Diagnosis 04-07-17 1. Breast, simple mastectomy, Left Prophylactic Skin Sparing - FIBROCYSTIC CHANGES WITH SCLEROSING ADENOSIS AND CALCIFICATIONS. - LOBULAR NEOPLASIA (ATYPICAL LOBULAR HYPERPLASIA). 2. Breast, simple mastectomy, Right Skin Sparing - INVASIVE AND IN SITU DUCTAL CARCINOMA, 2.1 CM. - MARGINS NOT INVOLVED. - FIBROCYSTIC CHANGES WITH CALCIFICATIONS AND FOCAL ATYPICAL DUCTAL HYPERPLASIA. - METASTATIC CARCINOMA IN ONE LYMPH NODE WITH ASSOCIATED BIOPSY CAVITY (1/1). 3. Lymph nodes, regional resection, Right Axillary - METASTATIC CARCINOMA IN TWO OF SIX LYMPH NODES (2/6).  Receptor Status: ER(60% +), PR (30% +), Her2-neu (-), Ki-(70% and 15%)  Did patient present with symptoms (if so, please note symptoms) or was this found on screening mammography?: Noticed palpable changes of the right breast which prompted diagnostic mammogram   Past/Anticipated interventions by surgeon, if any:04-23-17 Lymph nodes, regional resection,04-07-17  Right Axillary Contents, 04-07-17 Breast, simple mastectomy, Right Skin Sparing   Past/Anticipated interventions by medical oncology, if any: 02-22-17 Breast MRI,Started a week before her surgery and will restart it in two weeksTamoxifen 20 mg daily starting 02/14/17, Neoadjuvant Adriamycin and Cytoxan, every 2 weeks, with Neulasta, for total 4 cycles, followed by weekly Taxol for 12 weeks. 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 Docetaxel 69m/m2 on 01/24/17, was to be given every 3 weeks but stopped due to worsening neuropathy (only 1 cycle given)  Lymphedema issues, if any: Yes down right arm  Pain issues, if any: Yes low back 5/10 taking Ibuprofen Skin to right breast skin opened up last Friday 05-02-17  has been draining and the left has a seroma drained 60 ml about two weeks ago once.  Will see Dr. DCrissie Reesenext week for a follow up visit. SAFETY ISSUES:  Prior radiation? :No  Pacemaker/ICD? : No  Possible current pregnancy? :No  Is the patient on methotrexate? : No  Menarchal: 17 LMP: regular  Not since starting chemotherapy Contraceptive: no  HRT: n/a  G5P5: 5 daughters 30-19  Current Complaints / other details:51year old widowed woman    Wt Readings from Last 3 Encounters:  05/08/17 178 lb 9.6 oz (81 kg)  05/08/17 178 lb 12.8 oz (81.1 kg)  04/23/17 181 lb (82.1 kg)  BP (!) 121/56   Pulse 95   Temp 98.9 F (37.2 C) (Oral)   Resp 18   Ht 5' 5"  (1.651 m)   Wt 178 lb 9.6 oz (81 kg)   SpO2 100%   BMI 29.72 kg/m   BGeorgena Spurling RN 05/02/2017,3:25 PM

## 2017-05-04 ENCOUNTER — Other Ambulatory Visit: Payer: Self-pay | Admitting: Hematology

## 2017-05-05 ENCOUNTER — Institutional Professional Consult (permissible substitution): Payer: Self-pay | Admitting: Radiation Oncology

## 2017-05-06 ENCOUNTER — Telehealth: Payer: Self-pay | Admitting: Hematology

## 2017-05-06 NOTE — Telephone Encounter (Signed)
Spoke with patient re 6/14 f/u

## 2017-05-07 ENCOUNTER — Telehealth: Payer: Self-pay

## 2017-05-07 NOTE — Telephone Encounter (Signed)
[facility] called for tamoxifen refill. Called pt and lvm that tamoxifen is being refilled and to remind pt  That per Dr Burr Medico OV note 03/07/17 pt is to hold tamoxifen from 1 week before surgery to after completion of XRT.

## 2017-05-07 NOTE — Progress Notes (Signed)
[place]  [phone] [phone]) [phone]  Clinic Follow up Note   Patient Care Team: Patient, No Pcp Per as PCP - General (General Practice) Rolm Bookbinder, MD as Consulting Physician (General Surgery) Truitt Merle, MD as Consulting Physician (Hematology) Kyung Rudd, MD as Consulting Physician (Radiation Oncology) 05/08/2017  CHIEF COMPLAINTS:  Follow up right breast cancer  Oncology History   Cancer Staging Malignant neoplasm of lower-outer quadrant of right female breast [facility]) Staging form: Breast, AJCC 7th Edition - Clinical stage from 09/12/2016: Stage IIB (T2, N1, M0) - Signed by Truitt Merle, MD on 09/18/2016 - Pathologic stage from 04/07/2017: Stage IIB (T2, N1a, cM0) - Signed by Truitt Merle, MD on 05/10/2017       Malignant neoplasm of lower-outer quadrant of right female breast ([place])   09/10/2016 Mammogram    Mammogram and ultrasound showed a 2.7 cm mass at 8 clock position of the right breast, there is also a lobulated lymph nodes in the right axilla, slightly suspicious for malignancy.       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 Docetaxel 33m/m2 on 01/24/17, was to be given every 3 weeks but stopped due to worsening neuropathy (only 1 cycle given)      11/20/2016 Genetic Testing    Negative genetic testing on the  Breast/Ovarian cancer panel.  Negative genetic testing for the MSH2 inversion analysis ([place] inversion). The Breast/Ovarian gene panel offered by [facility] includes sequencing and rearrangement analysis for the following 20 genes:  ATM, BARD1, BRCA1, BRCA2, BRIP1, CDH1, CHEK2, EPCAM, FANCC, MLH1, MSH2, MSH6, NBN, PALB2, PMS2, PTEN, RAD51C, RAD51D, TP53, and XRCC2.   [facility] and November 20, 2016 for the [facility].      02/14/2017 -  Anti-estrogen oral therapy    Tamoxifen 20 mg daily starting 02/14/17      02/22/2017 Breast MRI    IMPRESSION: 1. Significantly smaller mass within the lower outer quadrant of the right breast, now measuring 1.5 cm (previously 2.7 cm). 2. Smaller right axillary lymph nodes.      04/07/2017 Surgery    RIGHT SKIN SPARING MASTECTOMY WITH RADIOACTIVE SEED GUIDED AXILLARY LYMPH NODE EXCISION AND AXILLARY SENTINEL LYMPH NODE BIOPSY, LEFT PROPHYLACTIC SKIN SPARING MASTECTOMY by Dr. WDonne Hazel      04/07/2017 Pathology Results    Diagnosis 04/07/17 1. Breast, simple mastectomy, Left Prophylactic Skin Sparing - FIBROCYSTIC CHANGES WITH SCLEROSING ADENOSIS AND CALCIFICATIONS. - LOBULAR NEOPLASIA (ATYPICAL LOBULAR HYPERPLASIA). 2. Breast, simple mastectomy, Right Skin Sparing - INVASIVE AND IN SITU DUCTAL CARCINOMA, 2.1 CM. - MARGINS NOT INVOLVED. - FIBROCYSTIC CHANGES WITH CALCIFICATIONS AND FOCAL ATYPICAL DUCTAL HYPERPLASIA. - METASTATIC CARCINOMA IN ONE LYMPH NODE WITH ASSOCIATED BIOPSY CAVITY (1/1). 3. Lymph nodes, regional resection, Right Axillary - METASTATIC CARCINOMA IN TWO OF SIX LYMPH NODES (  2/6).      04/23/2017 Surgery    RIGHT AXILLARY LYMPH NODE DISSECTION by Dr. Donne Hazel       04/23/2017 Pathology Results    Diagnosis 04/23/17 Lymph nodes, regional resection, Right Axillary Contents - NO CARCINOMA IDENTIFIED IN EIGHT LYMPH NODES (0/8)          HISTORY OF PRESENTING ILLNESS:  April Orozco 51  y.o. female is here because of her recently diagnosed right breast cancer. She is accompanied by her boyfriend to our multidisciplinary breast clinic today.   She noticed a right breast lump 3 monthsa ago, no pain or tenderness, no skin change or nipple discharge. She feels the lump has been getting bigger over the past 3 months. She called her gynecologist and was finally seen a few weeks ago. She was referred to have a diagnostic mammogram and ultrasound which showed a 2.7 cm mass at the 8:00 position of the right breast, and also a lobulated lymph nodes in the right axilla. 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  She feels well, denies any skin pain, or other symptoms. No recent weight loss. 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 Her husband died from small cell cancer several years ago, she has 5 daughters. She recently found out that 3 of her paternal cousins had breast cancer.  GYN HISTORY  Menarchal: 17 LMP: regular  Contraceptive: no  HRT: n/a  G5P5: 5 daughters 30-19  CURRENT THERAPY: Tamoxifen 20 mg daily started 02/14/17, held since surgery on 04/07/2017, restart 05/22/17   INTERVAL HISTORY:  April Orozco returns for follow-up. She presents to the clinic today post surgery. She reports her fluid build up will overflow on her right breast. She will get fluid build up on her left breast that will become painful. She does have tissue expender. She saw Dr. Donne Hazel last week and will see him again in 6 months. Dr. Marica Otter helps her with her breast fluid build up.  She also has back pain from poster change and  Lack of rehab. She tries to be active and walk around.   She recently also has a yeast infection that Dr. Marica Otter paused her antibiotics due to surgery, She has been out of work since May 12th, but  wants to go back.  With weight on left leg for some time she feels pain from a nerve. She takes Ibuprofen and tylenol for pain.  She has numbness and feels pulling on left arm     MEDICAL HISTORY:  Past Medical History:  Diagnosis Date  . Fracture, sacrum/coccyx ([place]) 1970   "Shattered tailbone"  . Heart murmur    [facility] has evaluated, felt benign  . Malignant neoplasm of lower-outer quadrant of right female breast ([place]) 09/13/2016  . Tachycardia    Intermittent, resolved with decreased caffeine intake    SURGICAL HISTORY: Past Surgical History:  Procedure Laterality Date  . APPENDECTOMY    . AXILLARY LYMPH NODE DISSECTION Right 04/23/2017   Procedure: RIGHT AXILLARY LYMPH NODE DISSECTION;  Surgeon: Rolm Bookbinder, MD;  Location: [place];  Service: General;  Laterality: Right;  . BREAST LUMPECTOMY WITH AXILLARY LYMPH NODE BIOPSY Right 04/23/2017  . BREAST RECONSTRUCTION WITH PLACEMENT OF TISSUE EXPANDER AND FLEX HD (ACELLULAR HYDRATED DERMIS) Bilateral 04/07/2017   Procedure: BILATERAL BREAST RECONSTRUCTION WITH PLACEMENT OF TISSUE EXPANDER AND FLEX  HD (ACELLULAR HYDRATED DERMIS);  Surgeon: Crissie Reese, MD;  Location: [place];  Service: Plastics;  Laterality: Bilateral;  . IR GENERIC HISTORICAL  01/10/2017   IR CV LINE INJECTION 01/10/2017 Greggory Keen, MD [facility]INTERV RAD  . MASTECTOMY    . MASTECTOMY WITH RADIOACTIVE SEED GUIDED EXCISION AND AXILLARY SENTINEL LYMPH NODE BIOPSY Bilateral 04/07/2017   Procedure: RIGHT SKIN SPARING MASTECTOMY WITH RADIOACTIVE SEED GUIDED AXILLARY LYMPH NODE EXCISION AND AXILLARY SENTINEL LYMPH NODE BIOPSY, LEFT PROPHYLACTIC SKIN SPARING MASTECTOMY;  Surgeon: Rolm Bookbinder, MD;  Location: [place];  Service: General;  Laterality: Bilateral;  . PORT-A-CATH REMOVAL N/A 04/07/2017   Procedure: REMOVAL PORT-A-CATH;  Surgeon: Rolm Bookbinder, MD;  Location: [place];  Service: General;  Laterality: N/A;  . PORTACATH PLACEMENT Right 09/23/2016    Procedure: INSERTION PORT-A-CATH WITH [place];  Surgeon: Rolm Bookbinder, MD;  Location: [place];  Service: General;  Laterality: Right;  . TONSILECTOMY/ADENOIDECTOMY WITH MYRINGOTOMY    . TUBAL LIGATION      SOCIAL HISTORY: Social History   Social History  . Marital status: Widowed    Spouse name: N/A  . Number of children: 5  . Years of education: N/A   Occupational History  . 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 Smoking status: Never Smoker  . Smokeless tobacco: Never Used  . Alcohol use Yes     Comment: social drinker   . Drug use: No  . Sexual activity: Not on file   Other Topics Concern  . Not on file   Social History Narrative  . No narrative on file    FAMILY HISTORY: Family History  Problem Relation Age of Onset  . Diabetes Mother   . Diabetes Father   . Skin cancer Father 36       NOS type; worked as a Theme park manager for many years  . Hypertension Other   . Breast cancer Cousin 23       paternal 1st cousin; w/ mets to bone  . Breast cancer Cousin        paternal 1st cousin dx 53-50; s/p lump  . Breast cancer Cousin        paternal 1st cousin dx 49-53 w/ mets to LN; s/p BL mastectomies  . Heart disease Maternal Aunt 58  . Colon cancer Paternal Uncle        dx 67-68  . Goiter Maternal Grandmother        d. bled to death following goiter surgery  . Heart attack Maternal Grandfather        d. early 68s  . Lung cancer Paternal Grandmother        dx 54s; d. 88y  . Diabetes Paternal Grandmother   . Heart failure Paternal Grandfather        d. 75-76  . Alzheimer's disease Paternal Grandfather   . Other Daughter 76       daughter w/ hx benign breast lump/cyst removed  . Epilepsy Maternal Aunt   . Alzheimer's disease Paternal Aunt   . Brain cancer Cousin 61       paternal 1st cousin; d. 52y; NOS type    ALLERGIES:  is allergic to banana; sulfur; and tramadol.  MEDICATIONS:  Current Outpatient Prescriptions    Medication Sig Dispense Refill  . acetaminophen (TYLENOL) 325 MG tablet Take 650 mg by mouth every 6 (six) hours as needed for mild pain.    Marland Kitchen ALPRAZolam (XANAX) 0.5 MG  tablet Take 1 tablet (0.5 mg total) by mouth at bedtime as needed for anxiety. 30 tablet 0  . gabapentin (NEURONTIN) 100 MG capsule Take 2 capsules (200 mg total) by mouth 3 (three) times daily. (Patient taking differently: Take 300 mg by mouth daily. ) 90 capsule 2  . ibuprofen (ADVIL,MOTRIN) 200 MG tablet Take 800 mg by mouth every 6 (six) hours as needed for headache, mild pain or moderate pain.    . methocarbamol (ROBAXIN) 500 MG tablet Take 1 tablet (500 mg total) by mouth 4 (four) times daily -  before meals and at bedtime. 30 tablet 0  . bimatoprost (LATISSE) 0.03 % ophthalmic solution Place 1 application into both eyes daily. Place one drop on applicator and apply evenly along the skin of the upper eyelid at base of eyelashes once daily at bedtime; repeat procedure for second eye (use a clean applicator).    . diphenoxylate-atropine (LOMOTIL) 2.5-0.025 MG tablet Take 1-2 tablets by mouth 4 (four) times daily as needed for diarrhea or loose stools. (Patient not taking: Reported on 05/08/2017) 60 tablet 2  . docusate sodium (COLACE) 50 MG capsule Take 50 mg by mouth 2 (two) times daily.    Marland Kitchen loperamide (IMODIUM) 2 MG capsule Take 2 mg by mouth as needed for diarrhea or loose stools.    . ondansetron (ZOFRAN) 8 MG tablet Take 1 tablet (8 mg total) by mouth 2 (two) times daily as needed for nausea or vomiting. (Patient not taking: Reported on 05/08/2017) 20 tablet 1  . oxyCODONE (OXY IR/ROXICODONE) 5 MG immediate release tablet Take 1-2 tablets (5-10 mg total) by mouth every 4 (four) hours as needed for moderate pain. (Patient not taking: Reported on 05/08/2017) 30 tablet 0  . prochlorperazine (COMPAZINE) 10 MG tablet Take 1 tablet (10 mg total) by mouth every 6 (six) hours as needed for nausea or vomiting. (Patient not taking:  Reported on 05/08/2017) 30 tablet 1  . tamoxifen (NOLVADEX) 20 MG tablet TAKE 1 TABLET BY MOUTH DAILY (Patient not taking: Reported on 05/08/2017) 30 tablet 0  . zolpidem (AMBIEN) 5 MG tablet Take 1 tablet (5 mg total) by mouth at bedtime as needed for sleep. (Patient not taking: Reported on 05/08/2017) 30 tablet 1   No current facility-administered medications for this visit.     REVIEW OF SYSTEMS:  Constitutional: Denies fevers, chills (+) night sweats, fatigue, hot flashes Eyes: Denies blurriness of vision, double vision or watery eyes Ears, nose, mouth, throat, and face: Denies mucositis or sore throat Respiratory: Denies dyspnea or wheezes Cardiovascular: Denies palpitation, chest discomfort or lower extremity swelling Gastrointestinal:  Denies nausea, heartburn,  Skin: Denies abnormal skin rashes Lymphatics: Denies new lymphadenopathy or easy bruising (+) swelling in hands and feet (+) right arm swelling  Neurological:Denies new weaknesses (+) tingling and numbness in hands and feet (+) left leg nerve pain MSK: (+) back pain  Reproductive: (+) yeast infection Behavioral/Psych: Mood is stable, no new changes (+) depression All other systems were reviewed with the patient and are negative.  PHYSICAL EXAMINATION: ECOG PERFORMANCE STATUS: 1  Vitals:   05/08/17 1146  BP: (!) 143/64  Pulse: 97  Resp: 18  Temp: 98.7 F (37.1 C)   Filed Weights   05/08/17 1146  Weight: 178 lb 12.8 oz (81.1 kg)     GENERAL:alert, no distress and comfortable (+) alopecia  SKIN: skin color, texture, turgor are normal, no rashes or significant lesions EYES: normal, conjunctiva are pink and non-injected, sclera clear OROPHARYNX:no exudate,  no erythema and lips, buccal mucosa, and tongue normal  NECK: supple, thyroid normal size, non-tender, without nodularity LYMPH:  no palpable lymphadenopathy in the cervical, axillary or inguinal (+) right arm swelling  LUNGS: clear to auscultation and percussion  with normal breathing effort HEART: regular rate & rhythm and no murmurs and no lower extremity edema ABDOMEN:abdomen soft, non-tender and normal bowel sounds Musculoskeletal:no cyanosis of digits and no clubbing  PSYCH: alert & oriented x 3 with fluent speech NEURO: (+) Moderately decreased vibration sensation in the extremities. Most prominent on the right upper extremity. Breasts: Breast inspection showed them to be symmetrical with no nipple discharge. Palpation of the breasts and axilla revealed a 1 cm mass at 8:00 position close to right areola, smaller than before, moveable, no other obvious mass that I could appreciate.(+) Post Bilateral mastectomy with tissue expanders, right incision has small opening on right side. Left incision healing well with moderate seroma in left breast    LABORATORY DATA:  I have reviewed the data as listed CBC Latest Ref Rng & Units 04/09/2017 04/01/2017 03/07/2017  WBC 4.0 - 10.5 K/uL 7.9 6.7 6.3  Hemoglobin 12.0 - 15.0 g/dL 11.3(L) 13.8 13.3  Hematocrit 36.0 - 46.0 % 34.3(L) 41.0 39.4  Platelets 150 - 400 K/uL 146(L) 219 246   CMP Latest Ref Rng & Units 04/09/2017 04/01/2017 03/07/2017  Glucose 65 - 99 mg/dL 112(H) 104(H) 116  BUN 6 - 20 mg/dL 7 10 16.8  Creatinine 0.44 - 1.00 mg/dL 0.71 0.68 0.7  Sodium 135 - 145 mmol/L 135 138 140  Potassium 3.5 - 5.1 mmol/L 3.7 3.8 3.8  Chloride 101 - 111 mmol/L 101 105 -  CO2 22 - 32 mmol/L 27 24 29   Calcium 8.9 - 10.3 mg/dL 8.6(L) 9.5 9.8  Total Protein 6.4 - 8.3 g/dL - - 7.7  Total Bilirubin 0.20 - 1.20 mg/dL - - <0.22  Alkaline Phos 40 - 150 U/L - - 73  AST 5 - 34 U/L - - 21  ALT 0 - 55 U/L - - 23     PATHOLOGY REPORTS:  Diagnosis 04/23/17 Lymph nodes, regional resection, Right Axillary Contents - NO CARCINOMA IDENTIFIED IN EIGHT LYMPH NODES (0/8)   Diagnosis 04/07/17 1. Breast, simple mastectomy, Left Prophylactic Skin Sparing - FIBROCYSTIC CHANGES WITH SCLEROSING ADENOSIS AND CALCIFICATIONS. - LOBULAR  NEOPLASIA (ATYPICAL LOBULAR HYPERPLASIA). 2. Breast, simple mastectomy, Right Skin Sparing - INVASIVE AND IN SITU DUCTAL CARCINOMA, 2.1 CM. - MARGINS NOT INVOLVED. - FIBROCYSTIC CHANGES WITH CALCIFICATIONS AND FOCAL ATYPICAL DUCTAL HYPERPLASIA. - METASTATIC CARCINOMA IN ONE LYMPH NODE WITH ASSOCIATED BIOPSY CAVITY (1/1). 3. Lymph nodes, regional resection, Right Axillary - METASTATIC CARCINOMA IN TWO OF SIX LYMPH NODES (2/6). Microscopic Comment 2. BREAST, STATUS POST NEOADJUVANT TREATMENT Procedure: Skin sparring mastectomy and axillary lymph node excision. Laterality: Right breast. Tumor Size: 2.1 cm. Histologic Type: Ductal. Grade: III Tubular Differentiation: 3 Nuclear Pleomorphism: 3 Mitotic Count: 2 Ductal Carcinoma in Situ (DCIS): Present, high grade with necrosis. Regional Lymph Nodes: Number of Lymph Nodes Examined: 7 Number of Sentinel Lymph Nodes Examined: 0 Lymph Nodes with Macrometastases: 2 Lymph Nodes with Micrometastases: 1 Lymph Nodes with Isolated Tumor Cells: 0 Margins: Free of tumor. Invasive carcinoma, distance from closest margin: 1.6 cm from anterior margin. DCIS, distance from closest margin: 1.6 cm from anterior margin. Extent of Tumor: Skin: Free of tumor. Nipple: Free of tumor. Skeletal Muscle: N/A Breast Prognostic Profile (pre-neoadjuvant case #: [id number]) Estrogen Receptor: 90% and 100%, positive, strong staining. Progesterone  Receptor: 90% and 100%, positive, strong staining. Her2: Negative, 1.09 and 1.52 Ki-67: 70% and 15%. Will be repeated on the current case (Block #: [id number] ) and the results reported separately. Residual Cancer Burden (RCB): Primary Tumor Bed: 21 mm x 14 mm Overall Cancer Cellularity: 90% Percentage of Cancer that is in Situ: 5% Number of Positive Lymph Nodes: 3 Diameter of Largest Lymph Node metastasis: 8 mm Residual Cancer Burden : 3.852 Residual Cancer Burden Class: RCB-III Pathologic Stage Classification (p TNM, AJCC  8th Edition): Primary Tumor (ypT): ypT2 Regional Lymph Nodes (ypN): ypN1a (JDP:gt, 04/08/17)   Diagnosis 09/12/2016 1. Lymph node, needle/core biopsy, right axilla - METASTATIC CARCINOMA WITH EXTRACAPSULAR EXTENSION. 2. Breast, right, needle core biopsy, 8 o'clock - INVASIVE DUCTAL CARCINOMA, GRADE 3.   RADIOGRAPHIC STUDIES: I have personally reviewed the radiological images as listed and agreed with the findings in the report. No results found.   MRI Bilateral Breast 03/07/17 IMPRESSION: 1. Significantly smaller mass within the lower outer quadrant of the right breast, now measuring 1.5 cm (previously 2.7 cm). 2. Smaller right axillary lymph nodes.   CT Angio Chest 10/05/2016 IMPRESSION: No evidence of significant pulmonary embolus. No evidence of active pulmonary disease. No evidence of metastasis in the chest.  ECHOCARDIOGRAM 02/10/17 Impressions: - Normal LV size with EF 60-65%. Normal diastolic function. Normal   RV size and systolic function. Aortic valve poorly visualized,   mild turbulence across it but no stenosis.  ASSESSMENT & PLAN:  51 y.o. premenopausal Caucasian female, presented with a palpable right breast mass.  1. 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 -She has completed 4 cycles of Adriamycin and Cytoxan, proceeded with Taxol (received 3 doses) and one dose docetaxel, has developed moderate peripheral neuropathy, chemotherapy was stopped due to neuropathy. --The patient started Tamoxifen the week of 02/14/17 while she is awaiting for surgery. She has held it since her breast surgery  -Pt had right mastectomy 04/07/17 and right axillary lymph node dissection 04/23/17 -I reviewed her surgical pathology findings, which showed 3 positive lymph nodes. She did not have a good  response to neoadjuvant chemotherapy. She underwent right axilla lymph node dissection. -I do not recommend adjuvant chemo due to her poor tolerance.  -Due to healing from surgery we will wait 2 more weeks to restart Tamoxifen. And will start radiation soon along with tamoxifen.    2. Genetics  -Her young age and strong family history of breast cancer, we'll refer her to his genetics for testing, to rule out inheritable genetic syndrome. -Negative genetic testing on the Breast/Ovarian cancer panel.  Negative genetic testing for the MSH2 inversion analysis ([place] inversion). The Breast/Ovarian gene panel offered by [facility] includes sequencing and rearrangement analysis for the following 20 genes:  ATM, BARD1, BRCA1, BRCA2, BRIP1, CDH1, CHEK2, EPCAM, FANCC, MLH1, MSH2, MSH6, NBN, PALB2, PMS2, PTEN, RAD51C, RAD51D, TP53, and XRCC2.   The report date is December 19 for the [facility] panel and November 20, 2016 for the [place] inversion.  3. Anxiety  and insomnia -She has been quite anxious since cancer diagnosis, but does not feel she needs medication -She has tried Ambien, do not like it  -continue lorazepam 0.5 mg as needed for insomnia and anxiety  4. Neuropathy, G2 -Chemotherapy induced. -Numbness and tingling of her fingers and toes. -Previously prescribed Neurontin, I recommended her to increase from 100  mg to 211m at night, and gradually titrate the dose to 3061mtid if needed   5. Hot flash and night sweats -She has tried Effexor for a few months, and not feel helpful, she has stop it.   Plan  -Refill XANAX today -start radiation in July.  -restart Tamoxifen in 2 weeks and continue during radiation  -labs and f/u in 2 months    All questions were answered. The patient knows to call the clinic with any problems, questions or concerns.  I spent 25 minutes counseling the patient face to face. The total time spent in the appointment was 30 minutes and more than 50% was on counseling.     FeTruitt MerleMD 05/08/2017   This document serves as a record of services personally performed by YaTruitt MerleMD. It was created on her behalf by AmJoslyn Devona trained medical scribe. The creation of this record is based on the scribe's personal observations and the provider's statements to them. This document has been checked and approved by the attending provider.

## 2017-05-08 ENCOUNTER — Telehealth: Payer: Self-pay | Admitting: *Deleted

## 2017-05-08 ENCOUNTER — Ambulatory Visit
Admission: RE | Admit: 2017-05-08 | Discharge: 2017-05-08 | Disposition: A | Discharge status: Home / Self Care | Payer: 59 | Source: Ambulatory Visit | Attending: Radiation Oncology | Admitting: Radiation Oncology

## 2017-05-08 ENCOUNTER — Ambulatory Visit (HOSPITAL_BASED_OUTPATIENT_CLINIC_OR_DEPARTMENT_OTHER): Payer: 59 | Admitting: Hematology

## 2017-05-08 ENCOUNTER — Encounter: Payer: Self-pay | Admitting: Radiation Oncology

## 2017-05-08 VITALS — BP 121/56 | HR 95 | Temp 98.9°F | Resp 18 | Ht 65.0 in | Wt 178.6 lb

## 2017-05-08 VITALS — BP 143/64 | HR 97 | Temp 98.7°F | Resp 18 | Ht 65.0 in | Wt 178.8 lb

## 2017-05-08 DIAGNOSIS — I890 Lymphedema, not elsewhere classified: Secondary | ICD-10-CM

## 2017-05-08 DIAGNOSIS — C50511 Malignant neoplasm of lower-outer quadrant of right female breast: Secondary | ICD-10-CM

## 2017-05-08 DIAGNOSIS — F419 Anxiety disorder, unspecified: Secondary | ICD-10-CM

## 2017-05-08 DIAGNOSIS — Z170 Estrogen receptor positive status [ER+]: Secondary | ICD-10-CM | POA: Insufficient documentation

## 2017-05-08 DIAGNOSIS — G4700 Insomnia, unspecified: Secondary | ICD-10-CM

## 2017-05-08 DIAGNOSIS — Z510 Encounter for antineoplastic radiation therapy: Secondary | ICD-10-CM | POA: Insufficient documentation

## 2017-05-08 DIAGNOSIS — Z79899 Other long term (current) drug therapy: Secondary | ICD-10-CM | POA: Insufficient documentation

## 2017-05-08 MED ORDER — ALPRAZOLAM 0.5 MG PO TABS: 0.5000 mg | ORAL_TABLET | Freq: Every evening | ORAL | 1 refills | Status: DC | PRN

## 2017-05-08 NOTE — Progress Notes (Signed)
Radiation Oncology         [phone] ________________________________  Name: April Orozco MRN: [id number]  Date: 05/08/2017  DOB: February 12, 1966  IF:OYDXAJO, No Pcp Per  Truitt Merle, MD     REFERRING PHYSICIAN: Truitt Merle, MD   DIAGNOSIS: The primary encounter diagnosis was Lymphedema. A diagnosis of Malignant neoplasm of lower-outer quadrant of right female breast, unspecified estrogen receptor status ([place]) was also pertinent to this visit.   HISTORY OF PRESENT ILLNESS: April Orozco is a 51 y.o. female seen seen in the multidisciplinary breast clinic for a new diagnosis of right breast cancer. The patient noticed palpable changes of the right breast which prompted diagnostic mammogram and and ultrasound on  09/12/16 which revealed 2.7 cm mass of the right breast with abnormality of an axillary node. Biopsy that day revealed a grade 3 invasive ductal carcinoma of the right breast, ER/PR positive, HER2 negative, Ki 67 was 15%, and the lymph node seen on ultrasound was also biopsied and her node revealed metastatic carcinoma, ER/PR positive, HER2 negative, and Ki 67 was 70%.   She went on to complete neoadjuvant chemotherapy between 10/01/16 and 01/24/17 with cytoxan, adriamycin, and taxane agents. She subsequently underwent an MRI on 02/22/17 which revealed improvement of disease, but persistent visible tumor measuring 1.5 x 1.5 x 1.5 cm, previously 2.7 x 2.4 x 2.5 cm. She had a skin sparing mastectomy with right sentinel node assessment and left skin sparing mastectomy on 04/07/17. Tissue expanders were placed at that time. Final pathology revealed a grade 3, invasive and insitu ductal carcinoma at 2.1 cm, and 3 of the 7 sampled nodes were positive. Her margins were negative. She went back to the OR on 04/23/17 for a right axillary node dissection, and none of the 8 sampled nodes contained disease. She comes today to discuss the role of adjuvant radiotherapy to the chest wall and regional nodes. Of note,  she's developed a left seroma, and after instilling her right expander, her mastectomy incision opened about 5-6 mm. She is on doxycycline and will follow up with Dr. Harlow Mares as well.   PREVIOUS RADIATION THERAPY: No   PAST MEDICAL HISTORY:  Past Medical History:  Diagnosis Date  . Fracture, sacrum/coccyx ([place]) 1970   "Shattered tailbone"  . Heart murmur    [facility] has evaluated, felt benign  . Malignant neoplasm of lower-outer quadrant of right female breast ([place]) 09/13/2016  . Tachycardia    Intermittent, resolved with decreased caffeine intake       PAST SURGICAL HISTORY: Past Surgical History:  Procedure Laterality Date  . APPENDECTOMY    . AXILLARY LYMPH NODE DISSECTION Right 04/23/2017   Procedure: RIGHT AXILLARY LYMPH NODE DISSECTION;  Surgeon: Rolm Bookbinder, MD;  Location: [place];  Service: General;  Laterality: Right;  . BREAST LUMPECTOMY WITH AXILLARY LYMPH NODE BIOPSY Right 04/23/2017  . BREAST RECONSTRUCTION WITH PLACEMENT OF TISSUE EXPANDER AND FLEX HD (ACELLULAR HYDRATED DERMIS) Bilateral 04/07/2017   Procedure: BILATERAL BREAST RECONSTRUCTION WITH PLACEMENT OF TISSUE EXPANDER AND FLEX HD (ACELLULAR HYDRATED DERMIS);  Surgeon: Crissie Reese, MD;  Location: [place];  Service: Plastics;  Laterality: Bilateral;  . IR GENERIC HISTORICAL  01/10/2017   IR CV LINE INJECTION 01/10/2017 Greggory Keen, MD [facility]  . MASTECTOMY    . MASTECTOMY WITH RADIOACTIVE SEED GUIDED EXCISION AND AXILLARY SENTINEL LYMPH NODE BIOPSY Bilateral 04/07/2017   Procedure: RIGHT SKIN SPARING MASTECTOMY WITH RADIOACTIVE SEED GUIDED AXILLARY LYMPH NODE EXCISION AND AXILLARY SENTINEL LYMPH NODE BIOPSY,  LEFT PROPHYLACTIC SKIN SPARING MASTECTOMY;  Surgeon: Rolm Bookbinder, MD;  Location: [place];  Service: General;  Laterality: Bilateral;  . PORT-A-CATH REMOVAL N/A 04/07/2017   Procedure: REMOVAL PORT-A-CATH;  Surgeon: Rolm Bookbinder, MD;  Location: [place];  Service: General;  Laterality:  N/A;  . PORTACATH PLACEMENT Right 09/23/2016   Procedure: INSERTION PORT-A-CATH WITH [place];  Surgeon: Rolm Bookbinder, MD;  Location: [place];  Service: General;  Laterality: Right;  . TONSILECTOMY/ADENOIDECTOMY WITH MYRINGOTOMY    . TUBAL LIGATION       FAMILY HISTORY:  Family History  Problem Relation Age of Onset  . Diabetes Mother   . Diabetes Father   . Skin cancer Father 67       NOS type; worked as a Theme park manager for many years  . Hypertension Other   . Breast cancer Cousin 19       paternal 1st cousin; w/ mets to bone  . Breast cancer Cousin        paternal 1st cousin dx 64-50; s/p lump  . Breast cancer Cousin        paternal 1st cousin dx 66-53 w/ mets to LN; s/p BL mastectomies  . Heart disease Maternal Aunt 58  . Colon cancer Paternal Uncle        dx 67-68  . Goiter Maternal Grandmother        d. bled to death following goiter surgery  . Heart attack Maternal Grandfather        d. early 29s  . Lung cancer Paternal Grandmother        dx 30s; d. 88y  . Diabetes Paternal Grandmother   . Heart failure Paternal Grandfather        d. 75-76  . Alzheimer's disease Paternal Grandfather   . Other Daughter 57       daughter w/ hx benign breast lump/cyst removed  . Epilepsy Maternal Aunt   . Alzheimer's disease Paternal Aunt   . Brain cancer Cousin 27       paternal 1st cousin; d. 23y; NOS type     SOCIAL HISTORY:  reports that she has never smoked. She has never used smokeless tobacco. She reports that she drinks alcohol. She reports that she does not use drugs. 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 She lives in [place] and is widowed but in a relationship.   ALLERGIES: Banana; Sulfur; and Tramadol   MEDICATIONS:  Current Outpatient Prescriptions  Medication Sig Dispense Refill  . acetaminophen (TYLENOL) 325 MG tablet Take 650 mg by mouth every 6 (six) hours as needed for mild pain.    Marland Kitchen ALPRAZolam (XANAX) 0.5 MG tablet Take 1  tablet (0.5 mg total) by mouth at bedtime as needed for anxiety. 30 tablet 1  . bimatoprost (LATISSE) 0.03 % ophthalmic solution Place 1 application into both eyes daily. Place one drop on applicator and apply evenly along the skin of the upper eyelid at base of eyelashes once daily at bedtime; repeat procedure for second eye (use a clean applicator).    Marland Kitchen doxycycline (VIBRAMYCIN) 100 MG capsule Take 100 mg by mouth 2 (two) times daily.    Marland Kitchen gabapentin (NEURONTIN) 100 MG capsule Take 2 capsules (200 mg total) by mouth 3 (three) times daily. (Patient taking differently: Take 300 mg by mouth daily. ) 90 capsule 2  . ibuprofen (ADVIL,MOTRIN) 200 MG tablet Take 800 mg by mouth every 6 (six) hours as needed for headache, mild pain or  moderate pain.    . methocarbamol (ROBAXIN) 500 MG tablet Take 1 tablet (500 mg total) by mouth 4 (four) times daily -  before meals and at bedtime. 30 tablet 0  . diphenoxylate-atropine (LOMOTIL) 2.5-0.025 MG tablet Take 1-2 tablets by mouth 4 (four) times daily as needed for diarrhea or loose stools. (Patient not taking: Reported on 05/08/2017) 60 tablet 2  . docusate sodium (COLACE) 50 MG capsule Take 50 mg by mouth 2 (two) times daily.    Marland Kitchen loperamide (IMODIUM) 2 MG capsule Take 2 mg by mouth as needed for diarrhea or loose stools.    . ondansetron (ZOFRAN) 8 MG tablet Take 1 tablet (8 mg total) by mouth 2 (two) times daily as needed for nausea or vomiting. (Patient not taking: Reported on 05/08/2017) 20 tablet 1  . oxyCODONE (OXY IR/ROXICODONE) 5 MG immediate release tablet Take 1-2 tablets (5-10 mg total) by mouth every 4 (four) hours as needed for moderate pain. (Patient not taking: Reported on 05/08/2017) 30 tablet 0  . prochlorperazine (COMPAZINE) 10 MG tablet Take 1 tablet (10 mg total) by mouth every 6 (six) hours as needed for nausea or vomiting. (Patient not taking: Reported on 05/08/2017) 30 tablet 1  . tamoxifen (NOLVADEX) 20 MG tablet TAKE 1 TABLET BY MOUTH DAILY  (Patient not taking: Reported on 05/08/2017) 30 tablet 0  . zolpidem (AMBIEN) 5 MG tablet Take 1 tablet (5 mg total) by mouth at bedtime as needed for sleep. (Patient not taking: Reported on 05/08/2017) 30 tablet 1   No current facility-administered medications for this encounter.      REVIEW OF SYSTEMS: On review of systems, the patient reports that she is doing well overall. She denies any chest pain, shortness of breath, cough, fevers, chills, night sweats, unintended weight changes. Her right mastectomy site has minimal separation and mild serous fluid on her dressing. She denies any bowel or bladder disturbances, and denies abdominal pain, nausea or vomiting. She has some discomfort and numbness along her medial arm and concerns with some swelling of her RUE, and decreased range of motion. She denies any new musculoskeletal or joint aches or pains, new skin lesions or concerns. A complete review of systems is obtained and is otherwise negative.     PHYSICAL EXAM:  Wt Readings from Last 3 Encounters:  05/08/17 178 lb 9.6 oz (81 kg)  05/08/17 178 lb 12.8 oz (81.1 kg)  04/23/17 181 lb (82.1 kg)   Temp Readings from Last 3 Encounters:  05/08/17 98.9 F (37.2 C) (Oral)  05/08/17 98.7 F (37.1 C) (Oral)  04/24/17 98.2 F (36.8 C) (Tympanic)   BP Readings from Last 3 Encounters:  05/08/17 (!) 121/56  05/08/17 (!) 143/64  04/24/17 (!) 101/45   Pulse Readings from Last 3 Encounters:  05/08/17 95  05/08/17 97  04/24/17 91     Pain scale 0/10 In general this is a well appearing caucasian female in no acute distress. She's alert and oriented x4 and appropriate throughout the examination. Cardiopulmonary assessment is negative for acute distress and she exhibits normal effort. The right chest has a tissue expander in place and healing mastectomy incision with minimal separation and fibrin in the wound bed. Her axillary incision is intact and her lateral chest wall reveals minimal edema.  Her left mastectomy scar is also well healed with intact tissue expander and about a 5 cm fluctuance consistent with seroma. She has 1+ pitting edema of her RUE from her wrist to the shoulder consistent  with lymphedema.     ECOG = 1  0 - Asymptomatic (Fully active, able to carry on all predisease activities without restriction)  1 - Symptomatic but completely ambulatory (Restricted in physically strenuous activity but ambulatory and able to carry out work of a light or sedentary nature. For example, light housework, office work)  2 - Symptomatic, <50% in bed during the day (Ambulatory and capable of all self care but unable to carry out any work activities. Up and about more than 50% of waking hours)  3 - Symptomatic, >50% in bed, but not bedbound (Capable of only limited self-care, confined to bed or chair 50% or more of waking hours)  4 - Bedbound (Completely disabled. Cannot carry on any self-care. Totally confined to bed or chair)  5 - Death   Eustace Pen MM, Creech RH, Tormey DC, et al. [id number]). "Toxicity and response criteria of the [facility]". [place] Oncol. 5 (6): 649-55    LABORATORY DATA:  Lab Results  Component Value Date   WBC 7.9 04/09/2017   HGB 11.3 (L) 04/09/2017   HCT 34.3 (L) 04/09/2017   MCV 91.7 04/09/2017   PLT 146 (L) 04/09/2017   Lab Results  Component Value Date   NA 135 04/09/2017   K 3.7 04/09/2017   CL 101 04/09/2017   CO2 27 04/09/2017   Lab Results  Component Value Date   ALT 23 03/07/2017   AST 21 03/07/2017   ALKPHOS 73 03/07/2017   BILITOT <0.22 03/07/2017      RADIOGRAPHY: No results found.     IMPRESSION/PLAN: 1. 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 Dr. Lisbeth Renshaw discusses the pathology findings and reviews the nature of invasive breast disease. The patient has done will with neoadjuvant chemotherapy and surgery, and will now move forward with external radiotherapy to  the breast, followed by antiestrogen therapy. We discussed the risks, benefits, short, and long term effects of radiotherapy, and the patient is interested in proceeding. We will hold off on simulation for a few more weeks. Dr. Lisbeth Renshaw outlines a course of 33 fractions over 6 1/2 weeks of treatment.  Dr. Lisbeth Renshaw discusses the delivery and logistics of radiotherapy and she would like to move forward when she's well healed. Written consent is obtained and placed in the chart, a copy was provided to the patient. I will contact her in about 2 weeks to see how she's recovering. 2. RUE Lymphedema. The patient will be referred to PT in [place], [place]. We will follow this closely and discussed the risks of this with radiotherapy as well.  The above documentation reflects my direct findings during this shared patient visit. Please see the separate note by Dr. Lisbeth Renshaw on this date for the remainder of the patient's plan of care.    Carola Rhine, PAC

## 2017-05-08 NOTE — Addendum Note (Signed)
Encounter addended by: Malena Edman, RN on: 05/08/2017  2:55 PM<BR>    Actions taken: Charge Capture section accepted

## 2017-05-08 NOTE — Addendum Note (Signed)
Encounter addended by: Kyung Rudd, MD on: 05/08/2017  6:41 PM<BR>    Actions taken: Problem List modified

## 2017-05-08 NOTE — Telephone Encounter (Signed)
CALLED PATIENT TO INFORM OF PT APPT. FOR 05-14-17- ARRIVAL TIME - 3:50 PM @ [facility], SPOKE WITH PATIENT AND SHE IS AWARE OF THIS APPT.

## 2017-05-10 ENCOUNTER — Encounter: Payer: Self-pay | Admitting: Hematology

## 2017-05-12 ENCOUNTER — Telehealth: Payer: Self-pay | Admitting: Radiation Oncology

## 2017-05-12 NOTE — Telephone Encounter (Signed)
Pt called to cancel appt with lymphedema we set up for Wednesday as she's having to undergo debridement and primary closer of her mastectomy site that had slightly dehisced. We will cancel this and follow up in a few weeks to see how her healing is coming along.

## 2017-05-15 ENCOUNTER — Telehealth: Payer: Self-pay | Admitting: Hematology

## 2017-05-15 ENCOUNTER — Other Ambulatory Visit (HOSPITAL_COMMUNITY): Payer: Self-pay | Admitting: Plastic Surgery

## 2017-05-15 DIAGNOSIS — N6489 Other specified disorders of breast: Secondary | ICD-10-CM

## 2017-05-15 NOTE — Telephone Encounter (Signed)
Appointments scheduled and confirmed with patient per 05/08/17 los.

## 2017-05-19 ENCOUNTER — Ambulatory Visit (HOSPITAL_COMMUNITY)
Admission: RE | Admit: 2017-05-19 | Discharge: 2017-05-19 | Disposition: A | Discharge status: Home / Self Care | Payer: 59 | Source: Ambulatory Visit | Attending: Plastic Surgery | Admitting: Plastic Surgery

## 2017-05-19 DIAGNOSIS — N6489 Other specified disorders of breast: Secondary | ICD-10-CM | POA: Diagnosis present

## 2017-05-19 DIAGNOSIS — Z9889 Other specified postprocedural states: Secondary | ICD-10-CM | POA: Insufficient documentation

## 2017-05-29 ENCOUNTER — Ambulatory Visit (INDEPENDENT_AMBULATORY_CARE_PROVIDER_SITE_OTHER): Payer: 59 | Admitting: Orthopaedic Surgery

## 2017-05-29 ENCOUNTER — Ambulatory Visit (INDEPENDENT_AMBULATORY_CARE_PROVIDER_SITE_OTHER): Payer: 59

## 2017-05-29 ENCOUNTER — Encounter (INDEPENDENT_AMBULATORY_CARE_PROVIDER_SITE_OTHER): Payer: Self-pay | Admitting: Orthopaedic Surgery

## 2017-05-29 VITALS — BP 107/75 | HR 102 | Ht 65.0 in | Wt 156.0 lb

## 2017-05-29 DIAGNOSIS — M545 Low back pain, unspecified: Secondary | ICD-10-CM | POA: Insufficient documentation

## 2017-05-29 DIAGNOSIS — M5440 Lumbago with sciatica, unspecified side: Secondary | ICD-10-CM

## 2017-05-29 NOTE — Progress Notes (Addendum)
Office Visit Note   Patient: April Orozco           Date of Birth: 1966-08-01           MRN: [id number] Visit Date: 05/29/2017              Requested by: No referring provider defined for this encounter. PCP: Patient, No Pcp Per   Assessment & Plan: Visit Diagnoses:  1. Low back pain with sciatica, sciatica laterality unspecified, unspecified back pain laterality, unspecified chronicity   2. Acute left-sided low back pain without sciatica       Patient likely may have a lumbar disc bulge. Her symptoms have improved and she is able ambulate without pain today.  Plan: I will recheck her in 3 weeks. Her symptoms have improved and there are no red flags. If she gets recurrence in her symptoms prior to that she will call consider diagnostic imaging.  Follow-Up Instructions: Return in about 3 weeks (around 06/19/2017).   Orders:  Orders Placed This Encounter  Procedures  . XR Lumbar Spine 2-3 Views   No orders of the defined types were placed in this encounter.     Procedures: No procedures performed   Clinical Data: No additional findings.   Subjective: Chief Complaint  Patient presents with  . 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 She said 3 breast cancer surgeries currently has a drain in for seromas in all 3 surgeries were in the last month. She states today she is doing better recently she's had less pain. She was diagnosed with piriformis syndrome several years ago on the left side. Patient's been through chemotherapy. She's not had a bone scan. We obtained plain x-rays today which are negative for lytic or blastic changes. She denies any pain that radiates to her foot. She had pain when she laid on the left side. She points the tensor fascia muscle where she was symptomatic. Negative  claudication symptoms.  Review of Systems via systems positive for breast cancer with positive lymph nodes repeat surgery for harvesting additional lymph nodes. She's been through chemotherapies had bilateral mastectomies has a drain in due to postoperative seroma. Additionally positive for anxiety insomnia, chemotherapy with neutropenia. Positive for back and left hip pain. Tonsillectomy age 67 appendectomy age 11. Bilateral mastectomy 04/07/2017. Additional lymphectomy right side 04/23/2017. Seroma drainage and revision mastectomy 05/16/2017.   Objective: Vital Signs: BP 107/75   Pulse (!) 102   Ht 5\' 5"  (1.651 m)   Wt 156 lb (70.8 kg)   BMI 25.96 kg/m   Physical Exam  Constitutional: She is oriented to person, place, and time. She appears well-developed.  HENT:  Head: Normocephalic.  Right Ear: External ear normal.  Left Ear: External ear normal.  Eyes: Pupils are equal, round, and reactive to light.  Neck: No tracheal deviation present. No thyromegaly present.  Cardiovascular: Normal rate.   Pulmonary/Chest: Effort normal.  Abdominal: Soft.  Musculoskeletal:  Patient is a history leg raising 90 no hip range of motion minimal trochanteric bursal tenderness. Tenderness over the tensor fascia. Sensation to lateral femoral cutaneous nerve is normal no tenderness over the ASIS. No inguinal lymphadenopathy. Normal hip range of motion. No hip flexion weakness. Quads are symmetrical she can heel and toe walk. Negative [place].  No venous engorgement. Distal pulses are 2+ and symmetrical. Negative straight leg raising 90. Negative popliteal compression test. No tenderness of the peroneal nerve at the fibular neck.  Neurological: She is alert and oriented to person, place, and time.  Skin: Skin is warm and dry.  Psychiatric: She has a normal mood and affect. Her behavior is normal.    Ortho Exam  Specialty Comments:  No specialty comments available.  Imaging: Xr Lumbar Spine 2-3  Views  Result Date: 05/29/2017 AP and lateral lumbar x-rays obtained. This shows some decrease lumbar lordosis.Disc space narrowing no scoliosis no facet changes no sclerotic or lytic changes in the lumbar spine. Impression: Lumbar x-rays negative for acute process changes    PMFS History: Patient Active Problem List   Diagnosis Date Noted  . Acute left-sided low back pain without sciatica 05/29/2017  . Menopausal syndrome (hot flushes) 01/23/2017  . Genetic testing 11/22/2016  . Neutropenia ([place]) 11/22/2016  . Angular cheilosis 11/22/2016  . Nausea with vomiting 11/13/2016  . Night sweats 11/13/2016  . Anxiety 11/01/2016  . Insomnia 11/01/2016  . Port catheter in place 11/01/2016  . Family history of breast cancer in female 10/24/2016  . Malignant neoplasm of lower-outer quadrant of right female breast ([place]) 09/13/2016  . Fracture, sacrum/coccyx [facility])    Past Medical History:  Diagnosis Date  . Fracture, sacrum/coccyx ([place]) 1970   "Shattered tailbone"  . Heart murmur    [facility] has evaluated, felt benign  . Malignant neoplasm of lower-outer quadrant of right female breast ([place]) 09/13/2016  . Tachycardia    Intermittent, resolved with decreased caffeine intake    Family History  Problem Relation Age of Onset  . Diabetes Mother   . Diabetes Father   . Skin cancer Father 29       NOS type; worked as a Theme park manager for many years  . Hypertension Other   . Breast cancer Cousin 73       paternal 1st cousin; w/ mets to bone  . Breast cancer Cousin        paternal 1st cousin dx 63-50; s/p lump  . Breast cancer Cousin        paternal 1st cousin dx 66-53 w/ mets to LN; s/p BL mastectomies  . Heart disease Maternal Aunt 58  . Colon cancer Paternal Uncle        dx 67-68  . Goiter Maternal Grandmother        d. bled to death following goiter surgery  . Heart attack Maternal Grandfather        d. early 56s  . Lung cancer Paternal Grandmother        dx 58s; d. 88y  .  Diabetes Paternal Grandmother   . Heart failure Paternal Grandfather        d. 75-76  . Alzheimer's disease Paternal Grandfather   . Other Daughter 74       daughter w/ hx benign breast lump/cyst removed  . Epilepsy Maternal Aunt   . Alzheimer's disease Paternal Aunt   . Brain cancer Cousin 34       paternal 1st cousin; d. 69y; NOS type    Past Surgical History:  Procedure Laterality Date  . APPENDECTOMY    . AXILLARY LYMPH NODE DISSECTION Right 04/23/2017   Procedure: RIGHT AXILLARY LYMPH NODE DISSECTION;  Surgeon: Rolm Bookbinder, MD;  Location: [place];  Service: General;  Laterality: Right;  . BREAST LUMPECTOMY WITH AXILLARY LYMPH NODE BIOPSY Right 04/23/2017  . BREAST  RECONSTRUCTION WITH PLACEMENT OF TISSUE EXPANDER AND FLEX HD (ACELLULAR HYDRATED DERMIS) Bilateral 04/07/2017   Procedure: BILATERAL BREAST RECONSTRUCTION WITH PLACEMENT OF TISSUE EXPANDER AND FLEX HD (ACELLULAR HYDRATED DERMIS);  Surgeon: Crissie Reese, MD;  Location: [place];  Service: Plastics;  Laterality: Bilateral;  . IR GENERIC HISTORICAL  01/10/2017   IR CV LINE INJECTION 01/10/2017 Greggory Keen, MD [facility]  . MASTECTOMY    . MASTECTOMY WITH RADIOACTIVE SEED GUIDED EXCISION AND AXILLARY SENTINEL LYMPH NODE BIOPSY Bilateral 04/07/2017   Procedure: RIGHT SKIN SPARING MASTECTOMY WITH RADIOACTIVE SEED GUIDED AXILLARY LYMPH NODE EXCISION AND AXILLARY SENTINEL LYMPH NODE BIOPSY, LEFT PROPHYLACTIC SKIN SPARING MASTECTOMY;  Surgeon: Rolm Bookbinder, MD;  Location: [place];  Service: General;  Laterality: Bilateral;  . PORT-A-CATH REMOVAL N/A 04/07/2017   Procedure: REMOVAL PORT-A-CATH;  Surgeon: Rolm Bookbinder, MD;  Location: [place];  Service: General;  Laterality: N/A;  . PORTACATH PLACEMENT Right 09/23/2016   Procedure: INSERTION PORT-A-CATH WITH [place];  Surgeon: Rolm Bookbinder, MD;  Location: [place];  Service: General;  Laterality: Right;  . TONSILECTOMY/ADENOIDECTOMY WITH MYRINGOTOMY    .  TUBAL LIGATION     Social History   Occupational History  . 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 Smoking status: Never Smoker  . Smokeless tobacco: Never Used  . Alcohol use Yes     Comment: social drinker   . Drug use: No  . Sexual activity: Not on file

## 2017-06-03 ENCOUNTER — Other Ambulatory Visit: Payer: Self-pay | Admitting: Plastic Surgery

## 2017-06-04 ENCOUNTER — Telehealth: Payer: Self-pay | Admitting: Radiation Oncology

## 2017-06-04 NOTE — Telephone Encounter (Addendum)
Check to see how mastectomy scar is healing since surgery on 6/19 to close incision. Unfortunately she had to undergo removal of her tissue expander due to to dehiscence yesterday. She is very disappointed because of this and the way her skin looks. I've let her know that I'll reach out to our social workers and navigator to help with resources and support during this time.

## 2017-06-06 ENCOUNTER — Encounter: Payer: Self-pay | Admitting: *Deleted

## 2017-06-06 NOTE — Progress Notes (Signed)
[place] Work  Clinical Social Work was referred by radiation oncology PA for assessment of psychosocial needs.  Clinical Social Worker attempted to contact patient at home to offer support and assess for needs. CSW left supportive message: introduced self, explained role of CSW/Pt and Family Support Team, support groups and other resources to assist. CSW encouraged return call.      Loren Racer, LCSW, OSW-C Clinical Social Worker [place]  [place] Phone: [phone] Fax: [phone]

## 2017-06-19 ENCOUNTER — Ambulatory Visit (INDEPENDENT_AMBULATORY_CARE_PROVIDER_SITE_OTHER): Payer: 59 | Admitting: Orthopaedic Surgery

## 2017-06-25 ENCOUNTER — Other Ambulatory Visit: Payer: Self-pay | Admitting: Hematology

## 2017-07-07 ENCOUNTER — Telehealth: Payer: Self-pay | Admitting: *Deleted

## 2017-07-07 NOTE — Telephone Encounter (Signed)
Pt called requesting to change office visit appt to a later date.   Per pt, she will start  Radiation on  07/17/17 -  Reason for the delay is pt had to have more surgery due to problem with her expander.  Pt currently has appts on  07/09/17. Pt's    Phone     [phone].

## 2017-07-07 NOTE — Telephone Encounter (Signed)
Yes, please reschedule her appointment to mid-Sep, thanks   Truitt Merle MD

## 2017-07-08 ENCOUNTER — Telehealth: Payer: Self-pay | Admitting: Hematology

## 2017-07-08 NOTE — Telephone Encounter (Signed)
sw pt to confirm r/s appt to 9/6 at 1 pm per sch msg

## 2017-07-09 ENCOUNTER — Ambulatory Visit: Payer: 59 | Admitting: Hematology

## 2017-07-09 ENCOUNTER — Other Ambulatory Visit: Payer: 59

## 2017-07-09 NOTE — Progress Notes (Signed)
Location of Breast Cancer:Right lower -outer quadrant in female  FUN after surgery , chemotherapy also referred for physical therapy for lymphedema.   Histology per Pathology Report:  Diagnosis 04-23-17 Lymph nodes, regional resection, Right Axillary Contents - NO CARCINOMA IDENTIFIED IN EIGHT LYMPH NODES (0/8)  Diagnosis 04-07-17 1. Breast, simple mastectomy, Left Prophylactic Skin Sparing - FIBROCYSTIC CHANGES WITH SCLEROSING ADENOSIS AND CALCIFICATIONS. - LOBULAR NEOPLASIA (ATYPICAL LOBULAR HYPERPLASIA). 2. Breast, simple mastectomy, Right Skin Sparing - INVASIVE AND IN SITU DUCTAL CARCINOMA, 2.1 CM. - MARGINS NOT INVOLVED. - FIBROCYSTIC CHANGES WITH CALCIFICATIONS AND FOCAL ATYPICAL DUCTAL HYPERPLASIA. - METASTATIC CARCINOMA IN ONE LYMPH NODE WITH ASSOCIATED BIOPSY CAVITY (1/1). 3. Lymph nodes, regional resection, Right Axillary - METASTATIC CARCINOMA IN TWO OF SIX LYMPH NODES (2/6).  Receptor Status: ER(60% +), PR (30% +), Her2-neu (-), Ki-(70% and 15%)  Did patient present with symptoms (if so, please note symptoms) or was this found on screening mammography?: Noticed palpable changes of the right breast which prompted diagnostic mammogram   Past/Anticipated interventions by surgeon, if any:04-23-17 Lymph nodes, regional resection,04-07-17  Right Axillary Contents, 04-07-17 Breast, simple mastectomy, Right Skin Sparing   Past/Anticipated interventions by medical oncology, if any: 02-22-17 Breast MRI,Started a week before her surgery and will restart it in two weeksTamoxifen 20 mg daily starting 02/14/17, Neoadjuvant Adriamycin and Cytoxan, every 2 weeks, with Neulasta, for total 4 cycles, followed by weekly Taxol for 12 weeks. 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 Docetaxel 49m/m2 on 01/24/17, was to be given every 3 weeks but stopped due to worsening neuropathy (only 1 cycle given)  Lymphedema issues, if any: Yes down right arm  Pain issues, if any:   Under her arm and lower part of arm  SAFETY ISSUES:  Prior radiation? :No  Pacemaker/ICD? : No  Possible current pregnancy? :No  Is the patient on methotrexate? : No  Menarchal: 17 LMP: regular  Not since starting chemotherapy Contraceptive: no  HRT: n/a  G5P5: 5 daughters 30-19  Current Complaints / other details:51year old widowed woman

## 2017-07-10 ENCOUNTER — Ambulatory Visit: Payer: 59 | Admitting: Radiation Oncology

## 2017-07-17 ENCOUNTER — Ambulatory Visit
Admission: RE | Admit: 2017-07-17 | Discharge: 2017-07-17 | Disposition: A | Discharge status: Home / Self Care | Payer: 59 | Source: Ambulatory Visit | Attending: Radiation Oncology | Admitting: Radiation Oncology

## 2017-07-17 ENCOUNTER — Encounter: Payer: Self-pay | Admitting: Radiation Oncology

## 2017-07-17 VITALS — BP 116/83 | HR 100 | Temp 98.8°F | Resp 18 | Wt 174.2 lb

## 2017-07-17 DIAGNOSIS — Z510 Encounter for antineoplastic radiation therapy: Secondary | ICD-10-CM | POA: Diagnosis not present

## 2017-07-17 DIAGNOSIS — Z170 Estrogen receptor positive status [ER+]: Principal | ICD-10-CM

## 2017-07-17 DIAGNOSIS — C50511 Malignant neoplasm of lower-outer quadrant of right female breast: Secondary | ICD-10-CM

## 2017-07-20 NOTE — Progress Notes (Addendum)
Radiation Oncology         [phone] ________________________________  Name: April Orozco MRN: [id number]  Date: 07/17/2017  DOB: 11-30-1965  PJ:[id number], No Pcp Per  Truitt Merle, MD     REFERRING PHYSICIAN: Truitt Merle, MD   DIAGNOSIS: The encounter diagnosis was Malignant neoplasm of lower-outer quadrant of right female breast, unspecified estrogen receptor status ([place]).   HISTORY OF PRESENT ILLNESS: April Orozco is a 51 y.o. female originally seen in the multidisciplinary breast clinic for a new diagnosis of right breast cancer. The patient noticed palpable changes of the right breast which prompted diagnostic mammogram and and ultrasound on  09/12/16 which revealed 2.7 cm mass of the right breast with abnormality of an axillary node. Biopsy that day revealed a grade 3 invasive ductal carcinoma of the right breast, ER/PR positive, HER2 negative, Ki 67 was 15%, and the lymph node seen on ultrasound was also biopsied and her node revealed metastatic carcinoma, ER/PR positive, HER2 negative, and Ki 67 was 70%.   She went on to complete neoadjuvant chemotherapy between 10/01/16 and 01/24/17 with cytoxan, adriamycin, and taxane agents. She subsequently underwent an MRI on 02/22/17 which revealed improvement of disease, but persistent visible tumor measuring 1.5 x 1.5 x 1.5 cm, previously 2.7 x 2.4 x 2.5 cm. She had a skin sparing mastectomy with right sentinel node assessment and left skin sparing mastectomy on 04/07/17. Tissue expanders were placed at that time. Final pathology revealed a grade 3, invasive and insitu ductal carcinoma at 2.1 cm, and 3 of the 7 sampled nodes were positive. Her margins were negative. She went back to the OR on 04/23/17 for a right axillary node dissection, and none of the 8 sampled nodes contained disease.Her postoperative course was complicated by wound separation of her right mastectomy scar and ultimately after weeks of dressing changes, and subsequent removal of her  tissue expander, she's ready to proceed with radiotherapy to the right breast. She comes today to reestablish care for starting radiation.  PREVIOUS RADIATION THERAPY: No   PAST MEDICAL HISTORY:  Past Medical History:  Diagnosis Date  . Fracture, sacrum/coccyx ([place]) 1970   "Shattered tailbone"  . Heart murmur    [facility] has evaluated, felt benign  . Malignant neoplasm of lower-outer quadrant of right female breast ([place]) 09/13/2016  . Tachycardia    Intermittent, resolved with decreased caffeine intake       PAST SURGICAL HISTORY: Past Surgical History:  Procedure Laterality Date  . APPENDECTOMY    . AXILLARY LYMPH NODE DISSECTION Right 04/23/2017   Procedure: RIGHT AXILLARY LYMPH NODE DISSECTION;  Surgeon: Rolm Bookbinder, MD;  Location: [facility];  Service: General;  Laterality: Right;  . BREAST LUMPECTOMY WITH AXILLARY LYMPH NODE BIOPSY Right 04/23/2017  . BREAST RECONSTRUCTION WITH PLACEMENT OF TISSUE EXPANDER AND FLEX HD (ACELLULAR HYDRATED DERMIS) Bilateral 04/07/2017   Procedure: BILATERAL BREAST RECONSTRUCTION WITH PLACEMENT OF TISSUE EXPANDER AND FLEX HD (ACELLULAR HYDRATED DERMIS);  Surgeon: Crissie Reese, MD;  Location: [facility];  Service: Plastics;  Laterality: Bilateral;  . IR GENERIC HISTORICAL  01/10/2017   IR CV LINE INJECTION 01/10/2017 Greggory Keen, MD [facility]  . MASTECTOMY    . MASTECTOMY WITH RADIOACTIVE SEED GUIDED EXCISION AND AXILLARY SENTINEL LYMPH NODE BIOPSY Bilateral 04/07/2017   Procedure: RIGHT SKIN SPARING MASTECTOMY WITH RADIOACTIVE SEED GUIDED AXILLARY LYMPH NODE EXCISION AND AXILLARY SENTINEL LYMPH NODE BIOPSY, LEFT PROPHYLACTIC SKIN SPARING MASTECTOMY;  Surgeon: Rolm Bookbinder, MD;  Location: [facility];  Service: General;  Laterality: Bilateral;  . PORT-A-CATH REMOVAL N/A 04/07/2017   Procedure: REMOVAL PORT-A-CATH;  Surgeon: Rolm Bookbinder, MD;  Location: [facility];  Service: General;  Laterality: N/A;  . PORTACATH PLACEMENT Right 09/23/2016     Procedure: INSERTION PORT-A-CATH WITH [place];  Surgeon: Rolm Bookbinder, MD;  Location: [facility];  Service: General;  Laterality: Right;  . TONSILECTOMY/ADENOIDECTOMY WITH MYRINGOTOMY    . TUBAL LIGATION       FAMILY HISTORY:  Family History  Problem Relation Age of Onset  . Diabetes Mother   . Diabetes Father   . Skin cancer Father 33       NOS type; worked as a Theme park manager for many years  . Hypertension Other   . Breast cancer Cousin 27       paternal 1st cousin; w/ mets to bone  . Breast cancer Cousin        paternal 1st cousin dx 35-50; s/p lump  . Breast cancer Cousin        paternal 1st cousin dx 62-53 w/ mets to LN; s/p BL mastectomies  . Heart disease Maternal Aunt 58  . Colon cancer Paternal Uncle        dx 67-68  . Goiter Maternal Grandmother        d. bled to death following goiter surgery  . Heart attack Maternal Grandfather        d. early 66s  . Lung cancer Paternal Grandmother        dx 60s; d. 88y  . Diabetes Paternal Grandmother   . Heart failure Paternal Grandfather        d. 75-76  . Alzheimer's disease Paternal Grandfather   . Other Daughter 51       daughter w/ hx benign breast lump/cyst removed  . Epilepsy Maternal Aunt   . Alzheimer's disease Paternal Aunt   . Brain cancer Cousin 32       paternal 1st cousin; d. 68y; NOS type     SOCIAL HISTORY:  reports that she has never smoked. She has never used smokeless tobacco. She reports that she drinks alcohol. She reports that she does not use drugs. 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 She lives in [place] and is widowed but in a relationship.   ALLERGIES: Banana; Sulfur; and Tramadol   MEDICATIONS:  Current Outpatient Prescriptions  Medication Sig Dispense Refill  . gabapentin (NEURONTIN) 100 MG capsule TAKE 1 CAPSULE BY MOUTH THREE TIMES A DAY 90 capsule 2  . ibuprofen (ADVIL,MOTRIN) 200 MG tablet Take 800 mg by mouth every 6 (six) hours as needed for  headache, mild pain or moderate pain.    . methocarbamol (ROBAXIN) 500 MG tablet Take 1 tablet (500 mg total) by mouth 4 (four) times daily -  before meals and at bedtime. 30 tablet 0  . tamoxifen (NOLVADEX) 20 MG tablet TAKE 1 TABLET BY MOUTH DAILY 30 tablet 0  . tamoxifen (NOLVADEX) 20 MG tablet TAKE 1 TABLET BY MOUTH DAILY     No current facility-administered medications for this encounter.      REVIEW OF SYSTEMS: On review of systems, the patient reports that she is doing well overall. She reports improvement in her healing but is still quite upset about the changes that her surgeries have caused and is fearful of not being able to have reconstruction in the future. She continues to work with lymphedema clinic due to her edema. She denies any chest pain, shortness of  breath, cough, fevers, chills, night sweats, unintended weight changes. She denies any bowel or bladder disturbances, and denies abdominal pain, nausea or vomiting. She denies any new musculoskeletal or joint aches or pains, new skin lesions or concerns. A complete review of systems is obtained and is otherwise negative.      PHYSICAL EXAM:  Wt Readings from Last 3 Encounters:  07/17/17 174 lb 4 oz (79 kg)  05/29/17 156 lb (70.8 kg)  05/08/17 178 lb 9.6 oz (81 kg)   Temp Readings from Last 3 Encounters:  07/17/17 98.8 F (37.1 C)  05/08/17 98.9 F (37.2 C) (Oral)  05/08/17 98.7 F (37.1 C) (Oral)   BP Readings from Last 3 Encounters:  07/17/17 116/83  05/29/17 107/75  05/08/17 (!) 121/56   Pulse Readings from Last 3 Encounters:  07/17/17 100  05/29/17 (!) 102  05/08/17 95     Pain scale 0/10 In general this is a well appearing caucasian female in no acute distress. She's alert and oriented x4 and appropriate throughout the examination. Cardiopulmonary assessment is negative for acute distress and she exhibits normal effort. The right chest wall is intact with a well healed mastectomy site. Her prior tissue  expander has since been removed. No edema of the mastectomy scar is noted, but there is mild edema of the chest wall and in her upper arm on the right. Her left mastectomy scar is well healed and tissue expander is noted beneath the scar, there is a small area toward the later aspect of her incision that appears to be well healed and consistent with prior separation.    ECOG = 1  0 - Asymptomatic (Fully active, able to carry on all predisease activities without restriction)  1 - Symptomatic but completely ambulatory (Restricted in physically strenuous activity but ambulatory and able to carry out work of a light or sedentary nature. For example, light housework, office work)  2 - Symptomatic, <50% in bed during the day (Ambulatory and capable of all self care but unable to carry out any work activities. Up and about more than 50% of waking hours)  3 - Symptomatic, >50% in bed, but not bedbound (Capable of only limited self-care, confined to bed or chair 50% or more of waking hours)  4 - Bedbound (Completely disabled. Cannot carry on any self-care. Totally confined to bed or chair)  5 - Death   Eustace Pen MM, Creech RH, Tormey DC, et al. [phone]). "Toxicity and response criteria of the [facility]". [place] Oncol. 5 (6): 649-55    LABORATORY DATA:  Lab Results  Component Value Date   WBC 7.9 04/09/2017   HGB 11.3 (L) 04/09/2017   HCT 34.3 (L) 04/09/2017   MCV 91.7 04/09/2017   PLT 146 (L) 04/09/2017   Lab Results  Component Value Date   NA 135 04/09/2017   K 3.7 04/09/2017   CL 101 04/09/2017   CO2 27 04/09/2017   Lab Results  Component Value Date   ALT 23 03/07/2017   AST 21 03/07/2017   ALKPHOS 73 03/07/2017   BILITOT <0.22 03/07/2017      RADIOGRAPHY: No results found.     IMPRESSION/PLAN: 1. 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 Dr. Lisbeth Renshaw discusses the patient's final pathology findings and reviews  the nature of invasive breast disease. She is ready to proceed now with radiotherapy. We reviewed the risks, benefits, short, and long term effects of treatment. Dr. Lisbeth Renshaw outlines  a course of 33 fractions over 6 1/2 weeks of treatment.  Dr. Lisbeth Renshaw discusses the delivery and logistics of radiotherapy and she would like to move forward. Written consent is obtained and placed in the chart, a copy was provided to the patient. She will simulate today. 2. RUE Lymphedema. The patient will continue PT in [place], [place]  In a visit lasting 25 minutes, greater than 50% of the time was spent face to face discussing the role of raditoherapy, and coordinating the patient's care.  The above documentation reflects my direct findings during this shared patient visit. Please see the separate note by Dr. Lisbeth Renshaw on this date for the remainder of the patient's plan of care.    Carola Rhine, PAC

## 2017-07-20 NOTE — Progress Notes (Signed)
  Radiation Oncology         (336) [phone] ________________________________  Name: April Orozco MRN: [id number]  Date: 07/17/2017  DOB: 04/03/1966  DIAGNOSIS:     ICD-10-CM   1. Malignant neoplasm of lower-outer quadrant of right breast of female, estrogen receptor positive ([place]) C50.511    Z17.0      SIMULATION AND TREATMENT PLANNING NOTE  The patient presented for simulation prior to beginning her course of radiation treatment for her diagnosis of right-sided breast cancer. The patient was placed in a supine position on a breast board. A customized vac-lock bag was also constructed and this complex treatment device will be used on a daily basis during her treatment. In this fashion, a CT scan was obtained through the chest area and an isocenter was placed near the chest wall at the upper aspect of the right chest.  The patient will be planned to receive a course of radiation initially to a dose of 50.4 gray. This will consist of a 4 field technique targeting the right chest wall as well as the supraclavicular region. Therefore 2 customized medial and lateral tangent fields have been created targeting the chest wall, and also 2 additional customized fields have been designed to treat the supraclavicular region both with a right supraclavicular field and a right posterior axillary boost field. A forward planning/reduced field technique will also be evaluated to determine if this significantly improves the dose homogeneity of the overall plan. Therefore, additional customized blocks/fields may be necessary.  This initial treatment will be accomplished at 1.8 gray per fraction.   The initial plan will consist of a 3-D conformal technique. The target volume/scar, heart and lungs have been contoured and dose volume histograms of each of these structures will be evaluated as part of the 3-D conformal treatment planning process.   It is anticipated that the patient will then receive a 10 gray boost to  the surgical scar. This will be accomplished at 2 gray per fraction. The final anticipated total dose therefore will correspond to 60.4 gray.    _______________________________   Jodelle Gross, MD, PhD

## 2017-07-20 NOTE — Progress Notes (Signed)
  Radiation Oncology         (336) [phone] ________________________________  Name: April Orozco MRN: [id number]  Date: 07/17/2017  DOB: 09/20/1966  Optical Surface Tracking Plan:  Since intensity modulated radiotherapy (IMRT) and 3D conformal radiation treatment methods are predicated on accurate and precise positioning for treatment, intrafraction motion monitoring is medically necessary to ensure accurate and safe treatment delivery.  The ability to quantify intrafraction motion without excessive ionizing radiation dose can only be performed with optical surface tracking. Accordingly, surface imaging offers the opportunity to obtain 3D measurements of patient position throughout IMRT and 3D treatments without excessive radiation exposure.  I am ordering optical surface tracking for this patient's upcoming course of radiotherapy. ________________________________  Kyung Rudd, MD 07/20/2017 5:42 PM    Reference:   Particia Jasper, et al. Surface imaging-based analysis of intrafraction motion for breast radiotherapy patients.Journal of [place], n. 6, nov. 2014. ISSN [id number].   Available at: <http://www.jacmp.org/index.php/jacmp/article/view/4957>.

## 2017-07-24 ENCOUNTER — Ambulatory Visit: Payer: 59 | Admitting: Radiation Oncology

## 2017-07-24 DIAGNOSIS — Z510 Encounter for antineoplastic radiation therapy: Secondary | ICD-10-CM | POA: Diagnosis not present

## 2017-07-26 ENCOUNTER — Ambulatory Visit: Payer: 59

## 2017-07-27 ENCOUNTER — Ambulatory Visit: Payer: 59

## 2017-07-29 ENCOUNTER — Ambulatory Visit: Payer: 59

## 2017-07-30 ENCOUNTER — Telehealth: Payer: Self-pay | Admitting: *Deleted

## 2017-07-30 ENCOUNTER — Ambulatory Visit: Payer: 59

## 2017-07-30 NOTE — Telephone Encounter (Signed)
Please refill her meds as her requested, and I will reschedule her appointment from tomorrow to her last week of radiation.   Truitt Merle MD

## 2017-07-30 NOTE — Telephone Encounter (Signed)
"  I'm calling for refill and appointments.  Scheduled tomorrow to see Dr. Burr Medico but th is needs to be cancelled.  Dr. Burr Medico wanted to see me post radiation.  Have not started yet.  Will start radiation on 08-08-2017.  Send refills in for my Tamoxifen and Xanax.  Thanks.  [phone]."  Routing call information to collaborative nurse and provider for review.  Further patient communication through collaborative nurse.

## 2017-07-31 ENCOUNTER — Ambulatory Visit: Payer: 59 | Admitting: Hematology

## 2017-07-31 ENCOUNTER — Other Ambulatory Visit: Payer: 59

## 2017-07-31 ENCOUNTER — Ambulatory Visit: Payer: 59

## 2017-07-31 ENCOUNTER — Telehealth: Payer: Self-pay | Admitting: Hematology

## 2017-07-31 ENCOUNTER — Other Ambulatory Visit: Payer: Self-pay | Admitting: *Deleted

## 2017-07-31 MED ORDER — ALPRAZOLAM 0.5 MG PO TABS: 0.5000 mg | ORAL_TABLET | Freq: Every evening | ORAL | 0 refills | Status: DC | PRN

## 2017-07-31 MED ORDER — TAMOXIFEN CITRATE 20 MG PO TABS: 20.0000 mg | ORAL_TABLET | Freq: Every day | ORAL | 1 refills | Status: DC

## 2017-07-31 NOTE — Telephone Encounter (Signed)
sw pt inform of r/s lab/MD appt to 10/29 at 2 pm per sch msg

## 2017-08-01 ENCOUNTER — Ambulatory Visit: Payer: 59

## 2017-08-02 ENCOUNTER — Ambulatory Visit: Payer: 59

## 2017-08-03 ENCOUNTER — Ambulatory Visit: Payer: 59

## 2017-08-04 ENCOUNTER — Ambulatory Visit: Payer: 59

## 2017-08-05 ENCOUNTER — Ambulatory Visit: Payer: 59

## 2017-08-06 ENCOUNTER — Ambulatory Visit: Payer: 59

## 2017-08-07 ENCOUNTER — Ambulatory Visit: Payer: 59

## 2017-08-08 ENCOUNTER — Ambulatory Visit
Admission: RE | Admit: 2017-08-08 | Discharge: 2017-08-08 | Disposition: A | Discharge status: Home / Self Care | Payer: 59 | Source: Ambulatory Visit | Attending: Radiation Oncology | Admitting: Radiation Oncology

## 2017-08-08 ENCOUNTER — Ambulatory Visit: Payer: 59

## 2017-08-08 DIAGNOSIS — Z791 Long term (current) use of non-steroidal anti-inflammatories (NSAID): Secondary | ICD-10-CM | POA: Diagnosis not present

## 2017-08-08 DIAGNOSIS — Z510 Encounter for antineoplastic radiation therapy: Secondary | ICD-10-CM | POA: Insufficient documentation

## 2017-08-08 DIAGNOSIS — Z91018 Allergy to other foods: Secondary | ICD-10-CM | POA: Insufficient documentation

## 2017-08-08 DIAGNOSIS — Z79899 Other long term (current) drug therapy: Secondary | ICD-10-CM | POA: Diagnosis not present

## 2017-08-08 DIAGNOSIS — Z170 Estrogen receptor positive status [ER+]: Secondary | ICD-10-CM | POA: Diagnosis not present

## 2017-08-08 DIAGNOSIS — Z882 Allergy status to sulfonamides status: Secondary | ICD-10-CM | POA: Diagnosis not present

## 2017-08-08 DIAGNOSIS — C50911 Malignant neoplasm of unspecified site of right female breast: Secondary | ICD-10-CM | POA: Diagnosis not present

## 2017-08-08 DIAGNOSIS — Z885 Allergy status to narcotic agent status: Secondary | ICD-10-CM | POA: Insufficient documentation

## 2017-08-09 ENCOUNTER — Ambulatory Visit: Payer: 59

## 2017-08-10 ENCOUNTER — Ambulatory Visit: Payer: 59

## 2017-08-11 ENCOUNTER — Ambulatory Visit
Admission: RE | Admit: 2017-08-11 | Discharge: 2017-08-11 | Disposition: A | Discharge status: Home / Self Care | Payer: 59 | Source: Ambulatory Visit | Attending: Radiation Oncology | Admitting: Radiation Oncology

## 2017-08-11 ENCOUNTER — Encounter (HOSPITAL_COMMUNITY): Payer: 59 | Admitting: Cardiology

## 2017-08-11 ENCOUNTER — Telehealth (HOSPITAL_COMMUNITY): Payer: Self-pay | Admitting: Vascular Surgery

## 2017-08-11 ENCOUNTER — Ambulatory Visit: Payer: 59

## 2017-08-11 ENCOUNTER — Ambulatory Visit (HOSPITAL_COMMUNITY): Admission: RE | Admit: 2017-08-11 | Payer: 59 | Source: Ambulatory Visit

## 2017-08-11 DIAGNOSIS — Z170 Estrogen receptor positive status [ER+]: Secondary | ICD-10-CM | POA: Diagnosis not present

## 2017-08-11 DIAGNOSIS — I890 Lymphedema, not elsewhere classified: Secondary | ICD-10-CM | POA: Diagnosis not present

## 2017-08-11 DIAGNOSIS — Z510 Encounter for antineoplastic radiation therapy: Secondary | ICD-10-CM | POA: Diagnosis not present

## 2017-08-11 DIAGNOSIS — C50511 Malignant neoplasm of lower-outer quadrant of right female breast: Secondary | ICD-10-CM | POA: Diagnosis present

## 2017-08-11 DIAGNOSIS — Z79899 Other long term (current) drug therapy: Secondary | ICD-10-CM | POA: Insufficient documentation

## 2017-08-11 MED ORDER — ALRA NON-METALLIC DEODORANT (RAD-ONC)
1.0000 "application " | Freq: Once | TOPICAL | Status: AC
  Administered 2017-08-11: 1 via TOPICAL

## 2017-08-11 MED ORDER — RADIAPLEXRX EX GEL
Freq: Once | CUTANEOUS | Status: AC
  Administered 2017-08-11: 16:00:00 via TOPICAL

## 2017-08-11 NOTE — Telephone Encounter (Signed)
Pt left message on answering service to cancel appt due to weather, left pt message to call to reschedule

## 2017-08-12 ENCOUNTER — Ambulatory Visit
Admission: RE | Admit: 2017-08-12 | Discharge: 2017-08-12 | Disposition: A | Discharge status: Home / Self Care | Payer: 59 | Source: Ambulatory Visit | Attending: Radiation Oncology | Admitting: Radiation Oncology

## 2017-08-12 DIAGNOSIS — Z510 Encounter for antineoplastic radiation therapy: Secondary | ICD-10-CM | POA: Diagnosis not present

## 2017-08-13 ENCOUNTER — Ambulatory Visit
Admission: RE | Admit: 2017-08-13 | Discharge: 2017-08-13 | Disposition: A | Discharge status: Home / Self Care | Payer: 59 | Source: Ambulatory Visit | Attending: Radiation Oncology | Admitting: Radiation Oncology

## 2017-08-13 DIAGNOSIS — Z510 Encounter for antineoplastic radiation therapy: Secondary | ICD-10-CM | POA: Diagnosis not present

## 2017-08-14 ENCOUNTER — Ambulatory Visit
Admission: RE | Admit: 2017-08-14 | Discharge: 2017-08-14 | Disposition: A | Discharge status: Home / Self Care | Payer: 59 | Source: Ambulatory Visit | Attending: Radiation Oncology | Admitting: Radiation Oncology

## 2017-08-14 DIAGNOSIS — Z510 Encounter for antineoplastic radiation therapy: Secondary | ICD-10-CM | POA: Diagnosis not present

## 2017-08-15 ENCOUNTER — Ambulatory Visit
Admission: RE | Admit: 2017-08-15 | Discharge: 2017-08-15 | Disposition: A | Discharge status: Home / Self Care | Payer: 59 | Source: Ambulatory Visit | Attending: Radiation Oncology | Admitting: Radiation Oncology

## 2017-08-15 DIAGNOSIS — Z510 Encounter for antineoplastic radiation therapy: Secondary | ICD-10-CM | POA: Diagnosis not present

## 2017-08-16 ENCOUNTER — Ambulatory Visit: Payer: 59

## 2017-08-17 ENCOUNTER — Ambulatory Visit: Payer: 59

## 2017-08-18 ENCOUNTER — Ambulatory Visit: Payer: 59

## 2017-08-19 ENCOUNTER — Ambulatory Visit
Admission: RE | Admit: 2017-08-19 | Discharge: 2017-08-19 | Disposition: A | Discharge status: Home / Self Care | Payer: 59 | Source: Ambulatory Visit | Attending: Radiation Oncology | Admitting: Radiation Oncology

## 2017-08-19 DIAGNOSIS — Z510 Encounter for antineoplastic radiation therapy: Secondary | ICD-10-CM | POA: Diagnosis not present

## 2017-08-20 ENCOUNTER — Ambulatory Visit
Admission: RE | Admit: 2017-08-20 | Discharge: 2017-08-20 | Disposition: A | Discharge status: Home / Self Care | Payer: 59 | Source: Ambulatory Visit | Attending: Radiation Oncology | Admitting: Radiation Oncology

## 2017-08-20 DIAGNOSIS — Z510 Encounter for antineoplastic radiation therapy: Secondary | ICD-10-CM | POA: Diagnosis not present

## 2017-08-21 ENCOUNTER — Ambulatory Visit
Admission: RE | Admit: 2017-08-21 | Discharge: 2017-08-21 | Disposition: A | Discharge status: Home / Self Care | Payer: 59 | Source: Ambulatory Visit | Attending: Radiation Oncology | Admitting: Radiation Oncology

## 2017-08-21 DIAGNOSIS — Z510 Encounter for antineoplastic radiation therapy: Secondary | ICD-10-CM | POA: Diagnosis not present

## 2017-08-22 ENCOUNTER — Ambulatory Visit
Admission: RE | Admit: 2017-08-22 | Discharge: 2017-08-22 | Disposition: A | Discharge status: Home / Self Care | Payer: 59 | Source: Ambulatory Visit | Attending: Radiation Oncology | Admitting: Radiation Oncology

## 2017-08-22 DIAGNOSIS — Z510 Encounter for antineoplastic radiation therapy: Secondary | ICD-10-CM | POA: Diagnosis not present

## 2017-08-23 ENCOUNTER — Ambulatory Visit: Payer: 59

## 2017-08-24 ENCOUNTER — Ambulatory Visit: Payer: 59

## 2017-08-25 ENCOUNTER — Ambulatory Visit
Admission: RE | Admit: 2017-08-25 | Discharge: 2017-08-25 | Disposition: A | Discharge status: Home / Self Care | Payer: 59 | Source: Ambulatory Visit | Attending: Radiation Oncology | Admitting: Radiation Oncology

## 2017-08-25 DIAGNOSIS — Z510 Encounter for antineoplastic radiation therapy: Secondary | ICD-10-CM | POA: Diagnosis not present

## 2017-08-26 ENCOUNTER — Ambulatory Visit
Admission: RE | Admit: 2017-08-26 | Discharge: 2017-08-26 | Disposition: A | Discharge status: Home / Self Care | Payer: 59 | Source: Ambulatory Visit | Attending: Radiation Oncology | Admitting: Radiation Oncology

## 2017-08-26 DIAGNOSIS — Z510 Encounter for antineoplastic radiation therapy: Secondary | ICD-10-CM | POA: Diagnosis not present

## 2017-08-27 ENCOUNTER — Ambulatory Visit
Admission: RE | Admit: 2017-08-27 | Discharge: 2017-08-27 | Disposition: A | Discharge status: Home / Self Care | Payer: 59 | Source: Ambulatory Visit | Attending: Radiation Oncology | Admitting: Radiation Oncology

## 2017-08-27 DIAGNOSIS — Z510 Encounter for antineoplastic radiation therapy: Secondary | ICD-10-CM | POA: Diagnosis not present

## 2017-08-28 ENCOUNTER — Ambulatory Visit
Admission: RE | Admit: 2017-08-28 | Discharge: 2017-08-28 | Disposition: A | Discharge status: Home / Self Care | Payer: 59 | Source: Ambulatory Visit | Attending: Radiation Oncology | Admitting: Radiation Oncology

## 2017-08-28 DIAGNOSIS — Z510 Encounter for antineoplastic radiation therapy: Secondary | ICD-10-CM | POA: Diagnosis not present

## 2017-08-29 ENCOUNTER — Ambulatory Visit
Admission: RE | Admit: 2017-08-29 | Discharge: 2017-08-29 | Disposition: A | Discharge status: Home / Self Care | Payer: 59 | Source: Ambulatory Visit | Attending: Radiation Oncology | Admitting: Radiation Oncology

## 2017-08-29 DIAGNOSIS — Z510 Encounter for antineoplastic radiation therapy: Secondary | ICD-10-CM | POA: Diagnosis not present

## 2017-08-30 ENCOUNTER — Ambulatory Visit: Payer: 59

## 2017-08-31 ENCOUNTER — Ambulatory Visit: Payer: 59

## 2017-09-01 ENCOUNTER — Ambulatory Visit
Admission: RE | Admit: 2017-09-01 | Discharge: 2017-09-01 | Disposition: A | Discharge status: Home / Self Care | Payer: 59 | Source: Ambulatory Visit | Attending: Radiation Oncology | Admitting: Radiation Oncology

## 2017-09-01 DIAGNOSIS — Z510 Encounter for antineoplastic radiation therapy: Secondary | ICD-10-CM | POA: Diagnosis not present

## 2017-09-02 ENCOUNTER — Ambulatory Visit
Admission: RE | Admit: 2017-09-02 | Discharge: 2017-09-02 | Disposition: A | Discharge status: Home / Self Care | Payer: 59 | Source: Ambulatory Visit | Attending: Radiation Oncology | Admitting: Radiation Oncology

## 2017-09-02 DIAGNOSIS — Z510 Encounter for antineoplastic radiation therapy: Secondary | ICD-10-CM | POA: Diagnosis not present

## 2017-09-03 ENCOUNTER — Ambulatory Visit
Admission: RE | Admit: 2017-09-03 | Discharge: 2017-09-03 | Disposition: A | Discharge status: Home / Self Care | Payer: 59 | Source: Ambulatory Visit | Attending: Radiation Oncology | Admitting: Radiation Oncology

## 2017-09-03 DIAGNOSIS — Z510 Encounter for antineoplastic radiation therapy: Secondary | ICD-10-CM | POA: Diagnosis not present

## 2017-09-04 ENCOUNTER — Ambulatory Visit: Payer: 59

## 2017-09-05 ENCOUNTER — Ambulatory Visit: Payer: 59

## 2017-09-07 ENCOUNTER — Ambulatory Visit: Payer: 59

## 2017-09-08 ENCOUNTER — Ambulatory Visit
Admission: RE | Admit: 2017-09-08 | Discharge: 2017-09-08 | Disposition: A | Discharge status: Home / Self Care | Payer: 59 | Source: Ambulatory Visit | Attending: Radiation Oncology | Admitting: Radiation Oncology

## 2017-09-08 DIAGNOSIS — Z510 Encounter for antineoplastic radiation therapy: Secondary | ICD-10-CM | POA: Diagnosis not present

## 2017-09-09 ENCOUNTER — Ambulatory Visit
Admission: RE | Admit: 2017-09-09 | Discharge: 2017-09-09 | Disposition: A | Discharge status: Home / Self Care | Payer: 59 | Source: Ambulatory Visit | Attending: Radiation Oncology | Admitting: Radiation Oncology

## 2017-09-09 DIAGNOSIS — Z510 Encounter for antineoplastic radiation therapy: Secondary | ICD-10-CM | POA: Diagnosis not present

## 2017-09-10 ENCOUNTER — Ambulatory Visit
Admission: RE | Admit: 2017-09-10 | Discharge: 2017-09-10 | Disposition: A | Discharge status: Home / Self Care | Payer: 59 | Source: Ambulatory Visit | Attending: Radiation Oncology | Admitting: Radiation Oncology

## 2017-09-10 DIAGNOSIS — Z510 Encounter for antineoplastic radiation therapy: Secondary | ICD-10-CM | POA: Diagnosis not present

## 2017-09-11 ENCOUNTER — Ambulatory Visit: Payer: 59

## 2017-09-11 ENCOUNTER — Ambulatory Visit
Admission: RE | Admit: 2017-09-11 | Discharge: 2017-09-11 | Disposition: A | Discharge status: Home / Self Care | Payer: 59 | Source: Ambulatory Visit | Attending: Radiation Oncology | Admitting: Radiation Oncology

## 2017-09-11 DIAGNOSIS — Z510 Encounter for antineoplastic radiation therapy: Secondary | ICD-10-CM | POA: Diagnosis not present

## 2017-09-12 ENCOUNTER — Ambulatory Visit: Payer: 59 | Admitting: Radiation Oncology

## 2017-09-12 ENCOUNTER — Ambulatory Visit
Admission: RE | Admit: 2017-09-12 | Discharge: 2017-09-12 | Disposition: A | Discharge status: Home / Self Care | Payer: 59 | Source: Ambulatory Visit | Attending: Radiation Oncology | Admitting: Radiation Oncology

## 2017-09-12 ENCOUNTER — Ambulatory Visit: Admission: RE | Admit: 2017-09-12 | Payer: 59 | Source: Ambulatory Visit | Admitting: Radiation Oncology

## 2017-09-12 DIAGNOSIS — Z510 Encounter for antineoplastic radiation therapy: Secondary | ICD-10-CM | POA: Diagnosis not present

## 2017-09-13 ENCOUNTER — Ambulatory Visit: Payer: 59

## 2017-09-15 ENCOUNTER — Ambulatory Visit
Admission: RE | Admit: 2017-09-15 | Discharge: 2017-09-15 | Disposition: A | Discharge status: Home / Self Care | Payer: 59 | Source: Ambulatory Visit | Attending: Radiation Oncology | Admitting: Radiation Oncology

## 2017-09-15 ENCOUNTER — Ambulatory Visit: Payer: 59 | Admitting: Radiation Oncology

## 2017-09-15 ENCOUNTER — Ambulatory Visit: Admission: RE | Admit: 2017-09-15 | Payer: 59 | Source: Ambulatory Visit | Admitting: Radiation Oncology

## 2017-09-15 DIAGNOSIS — Z510 Encounter for antineoplastic radiation therapy: Secondary | ICD-10-CM | POA: Diagnosis not present

## 2017-09-15 DIAGNOSIS — C50511 Malignant neoplasm of lower-outer quadrant of right female breast: Secondary | ICD-10-CM

## 2017-09-15 MED ORDER — SONAFINE EX EMUL
1.0000 "application " | Freq: Once | CUTANEOUS | Status: AC
  Administered 2017-09-15: 1 via TOPICAL

## 2017-09-16 ENCOUNTER — Ambulatory Visit: Payer: 59 | Admitting: Radiation Oncology

## 2017-09-16 ENCOUNTER — Ambulatory Visit: Payer: 59

## 2017-09-17 ENCOUNTER — Telehealth: Payer: Self-pay | Admitting: *Deleted

## 2017-09-17 ENCOUNTER — Ambulatory Visit: Payer: 59 | Admitting: Radiation Oncology

## 2017-09-17 ENCOUNTER — Ambulatory Visit: Payer: 59

## 2017-09-17 NOTE — Telephone Encounter (Signed)
Patient called, congested, fever,coughing, hoarse,feels she might have pneumonia, is going to an urgent care  And cancelling todays radiation treatment , will call on her either this afternoon before going home or in am to see how she is doing,  8:59 AM

## 2017-09-18 ENCOUNTER — Ambulatory Visit: Payer: 59

## 2017-09-19 ENCOUNTER — Telehealth: Payer: Self-pay | Admitting: *Deleted

## 2017-09-19 ENCOUNTER — Ambulatory Visit: Admission: RE | Admit: 2017-09-19 | Payer: 59 | Source: Ambulatory Visit | Admitting: Radiation Oncology

## 2017-09-19 ENCOUNTER — Ambulatory Visit: Payer: 59 | Admitting: Radiation Oncology

## 2017-09-19 ENCOUNTER — Ambulatory Visit: Payer: 59

## 2017-09-19 NOTE — Telephone Encounter (Signed)
Called patient home, unable to leave message, mailbox not set up as yet, called her work number, she wasn't at that office today 2:06 PM

## 2017-09-19 NOTE — Telephone Encounter (Signed)
Called,unable to leave message,mailbox is full, patient didn't show up for rad tx yesterday or today, 2nd time trying to talk with patient 3:58 PM

## 2017-09-21 ENCOUNTER — Ambulatory Visit: Payer: 59 | Admitting: Radiation Oncology

## 2017-09-21 NOTE — Progress Notes (Addendum)
[place]  [phone] [phone]  Clinic Follow up Note   Patient Care Team: Patient, No Pcp Per as PCP - General (General Practice) Rolm Bookbinder, MD as Consulting Physician (General Surgery) Truitt Merle, MD as Consulting Physician (Hematology) Kyung Rudd, MD as Consulting Physician (Radiation Oncology) 09/22/2017  CHIEF COMPLAINTS:  Follow up right breast cancer  Oncology History   Cancer Staging Malignant neoplasm of lower-outer quadrant of right female breast [facility]) Staging form: Breast, AJCC 7th Edition - Clinical stage from 09/12/2016: Stage IIB (T2, N1, M0) - Signed by Truitt Merle, MD on 09/18/2016 - Pathologic stage from 04/07/2017: Stage IIB (T2, N1a, cM0) - Signed by Truitt Merle, MD on 05/10/2017       Malignant neoplasm of lower-outer quadrant of right female breast ([facility])   09/10/2016 Mammogram    Mammogram and ultrasound showed a 2.7 cm mass at 8 clock position of the right breast, there is also a lobulated lymph nodes in the right axilla, slightly suspicious for malignancy.       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 Docetaxel '60mg'$ /m2 on 01/24/17, was to be given every 3 weeks but stopped due to worsening neuropathy (only 1 cycle given)      11/20/2016 Genetic Testing    Negative genetic testing on the  Breast/Ovarian cancer panel.  Negative genetic testing for the MSH2 inversion analysis ([place] inversion). The Breast/Ovarian gene panel offered by [facility] includes sequencing and rearrangement analysis for the following 20 genes:  ATM, BARD1, BRCA1, BRCA2, BRIP1, CDH1, CHEK2, EPCAM, FANCC, MLH1, MSH2, MSH6, NBN, PALB2, PMS2, PTEN, RAD51C, RAD51D, TP53, and XRCC2.   The report date is December 19 for the [facility] panel and November 20, 2016 for the [place] inversion.      02/14/2017 -  Anti-estrogen oral therapy    Tamoxifen 20 mg daily starting 02/14/17, held since surgery on 04/07/2017, restart 05/22/17      02/22/2017 Breast MRI    IMPRESSION: 1. Significantly smaller mass within the lower outer quadrant of the right breast, now measuring 1.5 cm (previously 2.7 cm). 2. Smaller right axillary lymph nodes.      04/07/2017 Surgery    RIGHT SKIN SPARING MASTECTOMY WITH RADIOACTIVE SEED GUIDED AXILLARY LYMPH NODE EXCISION AND AXILLARY SENTINEL LYMPH NODE BIOPSY, LEFT PROPHYLACTIC SKIN SPARING MASTECTOMY by Dr. Donne Hazel       04/07/2017 Pathology Results    Diagnosis 04/07/17 1. Breast, simple mastectomy, Left Prophylactic Skin Sparing - FIBROCYSTIC CHANGES WITH SCLEROSING ADENOSIS AND CALCIFICATIONS. - LOBULAR NEOPLASIA (ATYPICAL LOBULAR HYPERPLASIA). 2. Breast, simple mastectomy, Right Skin Sparing - INVASIVE AND IN SITU DUCTAL CARCINOMA, 2.1 CM. - MARGINS NOT INVOLVED. - FIBROCYSTIC CHANGES WITH CALCIFICATIONS AND FOCAL ATYPICAL DUCTAL HYPERPLASIA. - METASTATIC CARCINOMA IN ONE LYMPH NODE WITH ASSOCIATED BIOPSY CAVITY (1/1). 3. Lymph nodes, regional resection, Right Axillary - METASTATIC  CARCINOMA IN TWO OF SIX LYMPH NODES (2/6).      04/23/2017 Surgery    RIGHT AXILLARY LYMPH NODE DISSECTION by Dr. Donne Hazel       04/23/2017 Pathology Results    Diagnosis 04/23/17 Lymph nodes, regional resection, Right Axillary Contents - NO CARCINOMA IDENTIFIED IN EIGHT LYMPH NODES (0/8)        06/03/2017  Pathology Results    Skin right breast mastectomy and wound with results of: Skin with ulceration, acute suppurative inflammation and foci of suture material with giant cell reaction. No evidence of malignancy.       08/11/2017 -  Radiation Therapy    Adjuvant breast and axilla radiation         HISTORY OF PRESENTING ILLNESS:  April Orozco 51 y.o. female is here because of her recently diagnosed right breast cancer. She is accompanied by her boyfriend to our multidisciplinary breast clinic today.   She noticed a right breast lump 3 monthsa ago, no pain or tenderness, no skin change or nipple discharge. She feels the lump has been getting bigger over the past 3 months. She called her gynecologist and was finally seen a few weeks ago. She was referred to have a diagnostic mammogram and ultrasound which showed a 2.7 cm mass at the 8:00 position of the right breast, and also a lobulated lymph nodes in the right axilla. 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  She feels well, denies any skin pain, or other symptoms. No recent weight loss. 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 Her husband died from small cell cancer several years ago, she has 5 daughters. She recently found out that 3 of her paternal cousins had breast cancer.  GYN HISTORY  Menarchal: 17 LMP: regular  Contraceptive: no  HRT: n/a  G5P5: 5 daughters 30-19  CURRENT THERAPY: Tamoxifen 20 mg daily started 02/14/17, held since surgery on 04/07/2017, restarted 05/22/17    INTERVAL HISTORY:  April Orozco returns for follow-up. She was last seen on 05/08/17. She underwent immediate reconstruction after mastectomy and axillary lymph node dissection with tissue expanders. A couple weeks later she had bilateral revision of the mastectomy scars and  drainage of seroma on the right side. In July she developed wound dehiscence along the mastectomy scar on the right. There was no cellulitis; pathology showed acute suppurative inflammation and foci of suture material with giant cell reaction; there was no evidence of malignancy. She restarted tamoxifen 05/22/17 and began adjuvant radiation after her wound healed on 08/11/17. She canceled 4 treatments last week due to painful skin and URI that began 09/15/17. She was febrile to 102 with dry cough, wheezing, and mild dyspnea. She went to urgent care in [place] with Rx for amoxicillin, no imaging was apparently done. She has tried mucinex and OTC cough syrup with no improvement. She feels slightly improved today. She is tolerating tamoxifen daily; hot flashes are tolerable, she is able to sleep but requires PRN xanax for anxiety related to painful radiation skin changes. Her skin is painful, red, and peeling, she uses topicals per rad onc; she will see Dr. Lisbeth Renshaw tomorrow for ongoing assessment and management.   MEDICAL HISTORY:  Past Medical History:  Diagnosis Date  . Fracture, sacrum/coccyx ([place]) 1970   "Shattered tailbone"  . Heart murmur    [facility] has evaluated,  felt benign  . Malignant neoplasm of lower-outer quadrant of right female breast ([place]) 09/13/2016  . Tachycardia    Intermittent, resolved with decreased caffeine intake    SURGICAL HISTORY: Past Surgical History:  Procedure Laterality Date  . APPENDECTOMY    . AXILLARY LYMPH NODE DISSECTION Right 04/23/2017   Procedure: RIGHT AXILLARY LYMPH NODE DISSECTION;  Surgeon: Rolm Bookbinder, MD;  Location: [place] [facility];  Service: General;  Laterality: Right;  . BREAST LUMPECTOMY WITH AXILLARY LYMPH NODE BIOPSY Right 04/23/2017  . BREAST RECONSTRUCTION WITH PLACEMENT OF TISSUE EXPANDER AND FLEX HD (ACELLULAR HYDRATED DERMIS) Bilateral 04/07/2017   Procedure: BILATERAL BREAST RECONSTRUCTION WITH PLACEMENT OF TISSUE EXPANDER AND FLEX HD  (ACELLULAR HYDRATED DERMIS);  Surgeon: Crissie Reese, MD;  Location: [facility];  Service: Plastics;  Laterality: Bilateral;  . IR GENERIC HISTORICAL  01/10/2017   IR CV LINE INJECTION 01/10/2017 Greggory Keen, MD [facility]-INTERV RAD  . MASTECTOMY    . MASTECTOMY WITH RADIOACTIVE SEED GUIDED EXCISION AND AXILLARY SENTINEL LYMPH NODE BIOPSY Bilateral 04/07/2017   Procedure: RIGHT SKIN SPARING MASTECTOMY WITH RADIOACTIVE SEED GUIDED AXILLARY LYMPH NODE EXCISION AND AXILLARY SENTINEL LYMPH NODE BIOPSY, LEFT PROPHYLACTIC SKIN SPARING MASTECTOMY;  Surgeon: Rolm Bookbinder, MD;  Location: [place];  Service: General;  Laterality: Bilateral;  . PORT-A-CATH REMOVAL N/A 04/07/2017   Procedure: REMOVAL PORT-A-CATH;  Surgeon: Rolm Bookbinder, MD;  Location: [place];  Service: General;  Laterality: N/A;  . PORTACATH PLACEMENT Right 09/23/2016   Procedure: INSERTION PORT-A-CATH WITH [place];  Surgeon: Rolm Bookbinder, MD;  Location: [place];  Service: General;  Laterality: Right;  . TONSILECTOMY/ADENOIDECTOMY WITH MYRINGOTOMY    . TUBAL LIGATION      SOCIAL HISTORY: Social History   Social History  . Marital status: Widowed    Spouse name: N/A  . Number of children: 5  . Years of education: N/A   Occupational History  . 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 Smoking status: Never Smoker  . Smokeless tobacco: Never Used  . Alcohol use Yes     Comment: social drinker   . Drug use: No  . Sexual activity: Not on file   Other Topics Concern  . Not on file   Social History Narrative  . No narrative on file    FAMILY HISTORY: Family History  Problem Relation Age of Onset  . Diabetes Mother   . Diabetes Father   . Skin cancer Father 38       NOS type; worked as a Theme park manager for many years  . Hypertension Other   . Breast cancer Cousin 1       paternal 1st cousin; w/ mets to bone  . Breast cancer Cousin        paternal 1st cousin dx 69-50; s/p lump  . Breast  cancer Cousin        paternal 1st cousin dx 35-53 w/ mets to LN; s/p BL mastectomies  . Heart disease Maternal Aunt 58  . Colon cancer Paternal Uncle        dx 67-68  . Goiter Maternal Grandmother        d. bled to death following goiter surgery  . Heart attack Maternal Grandfather        d. early 85s  . Lung cancer Paternal Grandmother        dx 17s; d. 88y  . Diabetes Paternal Grandmother   . Heart failure Paternal Grandfather  d. 64-76  . Alzheimer's disease Paternal Grandfather   . Other Daughter 64       daughter w/ hx benign breast lump/cyst removed  . Epilepsy Maternal Aunt   . Alzheimer's disease Paternal Aunt   . Brain cancer Cousin 51       paternal 1st cousin; d. 14y; NOS type    ALLERGIES:  is allergic to banana; sulfur; and tramadol.  MEDICATIONS:  Current Outpatient Prescriptions  Medication Sig Dispense Refill  . ALPRAZolam (XANAX) 0.5 MG tablet Take 1 tablet (0.5 mg total) by mouth at bedtime as needed for anxiety. 20 tablet 0  . gabapentin (NEURONTIN) 100 MG capsule TAKE 1 CAPSULE BY MOUTH THREE TIMES A DAY 90 capsule 2  . hyaluronate sodium (RADIAPLEXRX) GEL Apply 1 application topically 2 (two) times daily. Apply to breast after rad txs and in am, nothing 4 hours prior to tx    . ibuprofen (ADVIL,MOTRIN) 200 MG tablet Take 800 mg by mouth every 6 (six) hours as needed for headache, mild pain or moderate pain.    . methocarbamol (ROBAXIN) 500 MG tablet Take 1 tablet (500 mg total) by mouth 4 (four) times daily -  before meals and at bedtime. 30 tablet 0  . non-metallic deodorant (ALRA) MISC Apply 1 application topically daily as needed.    . tamoxifen (NOLVADEX) 20 MG tablet Take 1 tablet (20 mg total) by mouth daily. 30 tablet 3  . Phenyleph-Chlorphen-Codeine 03-26-09 MG/5ML SYRP Take 5 mLs by mouth 2 (two) times daily as needed. 240 mL 0   No current facility-administered medications for this visit.     REVIEW OF SYSTEMS:  Constitutional: Denies chills  (+) hot flashes, stable and tolerable (+) fever, 102 max with recent URI Eyes: Denies blurriness of vision, double vision or watery eyes Ears, nose, mouth, throat, and face: Denies mucositis or sore throat Respiratory: (+) dry cough (+) dyspnea (+) wheezing secondary to URI began 10/22 improving overall Cardiovascular: Denies palpitation, chest discomfort or lower extremity swelling Gastrointestinal:  Denies nausea, heartburn, constipation, diarrhea.  Skin: (+) severe skin toxicity 2/2 to radiation to right breast/chest wall Lymphatics: Denies new lymphadenopathy or easy bruising (+) lymphedema to right arm and hand, wears compression sleeve and uses compression machine with improvement  Neurological:Denies new weaknesses (+) tingling and numbness in hands and feet, worse in feet after working all day; stable  MSK: (+) stable back and joint pain Behavioral/Psych: Mood is stable, no new changes. (+) mild anxiety, uses Xanax PRN at night All other systems were reviewed with the patient and are negative.  PHYSICAL EXAMINATION:  ECOG PERFORMANCE STATUS: 1  Vitals:   09/22/17 1433  BP: (!) 110/56  Pulse: 92  Resp: 17  Temp: 98.4 F (36.9 C)  SpO2: 100%   Filed Weights   09/22/17 1433  Weight: 173 lb 3.2 oz (78.6 kg)     GENERAL:alert, no distress and comfortable  SKIN: in general skin color, texture, turgor are normal, no rashes (+) right chest wall and axilla with moderate erythema, peeling, and dry desquamation secondary to radiation therapy.  EYES: normal, conjunctiva are pink and non-injected, sclera clear OROPHARYNX:no exudate, no erythema and lips, buccal mucosa, and tongue normal  NECK: supple, thyroid normal size, non-tender, without nodularity LYMPH:  no palpable cervical, supraclavicular, or axillary lymphadenopathy (+) right upper lymphedema LUNGS: normal breathing effort  (+) bilateral wheezes to lung bases, clears with cough HEART: regular rate & rhythm and no murmurs and  no lower extremity edema  ABDOMEN:abdomen soft, non-tender and normal bowel sounds Musculoskeletal:no cyanosis of digits and no clubbing  PSYCH: alert & oriented x 3 with fluent speech NEURO: (+) Moderately decreased vibration sensation in the extremities. Most prominent on the right upper extremity. Breasts: Breast inspection shows bilateral mastectomy, tissue expander present in left breast, incision well healed. Right tissue expander has been surgically removed, incision healing well. Axillary incision is closed and intact, skin hyperpigmentation and peeling in the surgical area is noted.   LABORATORY DATA:  I have reviewed the data as listed CBC Latest Ref Rng & Units 09/22/2017 04/09/2017 04/01/2017  WBC 3.9 - 10.3 10e3/uL 7.4 7.9 6.7  Hemoglobin 11.6 - 15.9 g/dL 12.9 11.3(L) 13.8  Hematocrit 34.8 - 46.6 % 38.9 34.3(L) 41.0  Platelets 145 - 400 10e3/uL 218 146(L) 219   CMP Latest Ref Rng & Units 09/22/2017 04/09/2017 04/01/2017  Glucose 70 - 140 mg/dl 108 112(H) 104(H)  BUN 7.0 - 26.0 mg/dL 9._0 Creatinine 0.6 - 1.1 mg/dL 0.7 0.71 0.68  Sodium 136 - 145 mEq/L 137 135 138  Potassium 3.5 - 5.1 mEq/L 3.9 3.7 3.8  Chloride 101 - 111 mmol/L - 101 105  CO2 22 - 29 mEq/L _1 Calcium 8.4 - 10.4 mg/dL 9.6 8.6(L) 9.5  Total Protein 6.4 - 8.3 g/dL 7.9 - -  Total Bilirubin 0.20 - 1.20 mg/dL <0.22 - -  Alkaline Phos 40 - 150 U/L 106 - -  AST 5 - 34 U/L 17 - -  ALT 0 - 55 U/L 12 - -    PATHOLOGY REPORTS: NOSIS Diagnosis, 06/03/17 Skin , from right breast mastectomy, and wound - SKIN WITH ULCERATION, ACUTE SUPPURATIVE INFLAMMATION AND FOCI OF SUTURE MATERIAL WITH GIANT CELL REACTION. - NO EVIDENCE OF MALIGNANCY.  Diagnosis 04/23/17 Lymph nodes, regional resection, Right Axillary Contents - NO CARCINOMA IDENTIFIED IN EIGHT LYMPH NODES (0/8)   Diagnosis 04/07/17 1. Breast, simple mastectomy, Left Prophylactic Skin Sparing - FIBROCYSTIC CHANGES WITH SCLEROSING ADENOSIS AND  CALCIFICATIONS. - LOBULAR NEOPLASIA (ATYPICAL LOBULAR HYPERPLASIA). 2. Breast, simple mastectomy, Right Skin Sparing - INVASIVE AND IN SITU DUCTAL CARCINOMA, 2.1 CM. - MARGINS NOT INVOLVED. - FIBROCYSTIC CHANGES WITH CALCIFICATIONS AND FOCAL ATYPICAL DUCTAL HYPERPLASIA. - METASTATIC CARCINOMA IN ONE LYMPH NODE WITH ASSOCIATED BIOPSY CAVITY (1/1). 3. Lymph nodes, regional resection, Right Axillary - METASTATIC CARCINOMA IN TWO OF SIX LYMPH NODES (2/6). Microscopic Comment 2. BREAST, STATUS POST NEOADJUVANT TREATMENT Procedure: Skin sparring mastectomy and axillary lymph node excision. Laterality: Right breast. Tumor Size: 2.1 cm. Histologic Type: Ductal. Grade: III Tubular Differentiation: 3 Nuclear Pleomorphism: 3 Mitotic Count: 2 Ductal Carcinoma in Situ (DCIS): Present, high grade with necrosis. Regional Lymph Nodes: Number of Lymph Nodes Examined: 7 Number of Sentinel Lymph Nodes Examined: 0 Lymph Nodes with Macrometastases: 2 Lymph Nodes with Micrometastases: 1 Lymph Nodes with Isolated Tumor Cells: 0 Margins: Free of tumor. Invasive carcinoma, distance from closest margin: 1.6 cm from anterior margin. DCIS, distance from closest margin: 1.6 cm from anterior margin. Extent of Tumor: Skin: Free of tumor. Nipple: Free of tumor. Skeletal Muscle: N/A Breast Prognostic Profile (pre-neoadjuvant case #: [id number]) Estrogen Receptor: 90% and 100%, positive, strong staining. Progesterone Receptor: 90% and 100%, positive, strong staining. Her2: Negative, 1.09 and 1.52 Ki-67: 70% and 15%. Will be repeated on the current case (Block #: [id number] ) and the results reported separately. Residual Cancer Burden (RCB): Primary Tumor Bed: 21 mm x 14 mm Overall Cancer Cellularity: 90% Percentage of Cancer that is  in Situ: 5% Number of Positive Lymph Nodes: 3 Diameter of Largest Lymph Node metastasis: 8 mm Residual Cancer Burden : 3.852 Residual Cancer Burden Class: RCB-III Pathologic Stage  Classification (p TNM, AJCC 8th Edition): Primary Tumor (ypT): ypT2 Regional Lymph Nodes (ypN): ypN1a (JDP:gt, 04/08/17)   Diagnosis 09/12/2016 1. Lymph node, needle/core biopsy, right axilla - METASTATIC CARCINOMA WITH EXTRACAPSULAR EXTENSION. 2. Breast, right, needle core biopsy, 8 o'clock - INVASIVE DUCTAL CARCINOMA, GRADE 3.   RADIOGRAPHIC STUDIES: I have personally reviewed the radiological images as listed and agreed with the findings in the report. No results found.   Lumbar spine Xray, 05/29/2017 IMPRESSION:  Lumbar x-rays negative for acute process changes  [place] CHEST (PLEURAL EFFUSION), 05/19/2017 IMPRESSION: No abnormal postoperative fluid collection identified at the site of clinical concern.  MRI Bilateral Breast 03/07/17 IMPRESSION: 1. Significantly smaller mass within the lower outer quadrant of the right breast, now measuring 1.5 cm (previously 2.7 cm). 2. Smaller right axillary lymph nodes.   ECHOCARDIOGRAM 02/10/17 Impressions: - Normal LV size with EF 60-65%. Normal diastolic function. Normal   RV size and systolic function. Aortic valve poorly visualized,   mild turbulence across it but no stenosis.  CT Angio Chest 10/05/2016 IMPRESSION: No evidence of significant pulmonary embolus. No evidence of active pulmonary disease. No evidence of metastasis in the chest.   ASSESSMENT & PLAN:  51 y.o. premenopausal Caucasian female, presented with a palpable right breast mass.  1. 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 -She completed 4 cycles of Adriamycin and Cytoxan, proceeded with Taxol (received 3 doses) and one dose docetaxel, has developed moderate peripheral neuropathy, chemotherapy was stopped due to neuropathy. --The patient started Tamoxifen the week of 02/14/17 while she  awaited surgery. She has held it since her breast surgery  -Pt had right mastectomy 04/07/17 and right axillary lymph node dissection 04/23/17 -I reviewed her surgical pathology findings, which showed 3 positive lymph nodes. She did not have a good response to neoadjuvant chemotherapy. She underwent right axilla lymph node dissection. -in 04/2017 she underwent bilateral revision of the mastectomy scars and drainage of seroma on the right side.  -In 7/2018y she developed wound dehiscence along the mastectomy scar on the right. There was no cellulitis; pathology showed acute suppurative inflammation and foci of suture material with giant cell reaction; there was no evidence of malignancy.  -Dr. Burr Medico did not recommend adjuvant chemo due to her poor tolerance, she restarted tamoxifen 05/22/17  -she began adjuvant radiation 08/12/17, she has had multiple treatment delays; her projected final treatment is 10/01/17 -we discussed cancer surveillance after completion of radiation with lab and physical exam; she had bilateral mastectomy and will not need mammogram -we will begin to monitor bone density in 1-2 years -she has not had menses since chemotherapy, will check [place] to assess whether she is menopausal next year -she will return in 3 months, then we can space her visits to every 4 months  2. Genetics  -she was referred to genetics due to her young age and strong family history of breast cancer to rule out inheritable genetic syndrome. -Negative genetic testing on the Breast/Ovarian cancer panel.  Negative genetic testing for the MSH2 inversion analysis ([place] inversion). The Breast/Ovarian gene panel offered by [facility] includes sequencing and rearrangement analysis for the following 20 genes:  ATM, BARD1, BRCA1, BRCA2, BRIP1, CDH1, CHEK2,  EPCAM, FANCC, MLH1, MSH2, MSH6, NBN, PALB2, PMS2, PTEN, RAD51C, RAD51D, TP53, and XRCC2.   The report date is December 19 for the [facility] panel and November 20, 2016 for the [place]  inversion.  3. Anxiety  and insomnia -She has been quite anxious since cancer diagnosis -She has previously tried Ambien, did not like it  -Previously advised the patient to continue lorazepam 0.5 mg as needed for insomnia and anxiety -she is on xanax now, tolerating well with improved sleep  4. Neuropathy, G2 -Chemotherapy induced. -Numbness and tingling of her fingers and toes. -Previously prescribed Neurontin,  -Dr. Burr Medico previously recommended her to increase from 100 mg to 262m at night, and gradually titrate the dose to 3012mtid if needed;  -she continues 100 mg TID which keeps symptoms stable so she can work  5. Hot flash and night sweats -Previously, she has tried Effexor for a few months, and not feel helpful, she has stop it. -hot flashes are tolerable since restarting tamoxifen, will monitor.  6. URI -symptoms began 09/15/17 with fever, dry cough, dyspnea, and wheezing -she was prescribed amoxicillin at local urgent care, no imaging done -she feels improved overall but symptoms not yet resolved, she is no longer febrile -Rx for cough suppressant given today   Plan  -Rx for cough suppression -continue radiation therapy, planned final treatment 11/7 -continue tamoxifen daily, refilled today -continue 100 mg TID neurontin for peripheral neuropathy, refilled today -lab and f/uwith APP in 3 months     All questions were answered. The patient knows to call the clinic with any problems, questions or concerns. I spent 25 minutes counseling the patient face to face. The total time spent in the appointment was 30 minutes and more than 50% was on counseling.    LaAlla FeelingNP 09/22/2017   I have seen the patient, examined her. I agree with the assessment and and plan and have edited the notes.   FeTruitt Merle10/29/2018

## 2017-09-22 ENCOUNTER — Ambulatory Visit (HOSPITAL_BASED_OUTPATIENT_CLINIC_OR_DEPARTMENT_OTHER): Payer: 59 | Admitting: Hematology

## 2017-09-22 ENCOUNTER — Ambulatory Visit
Admission: RE | Admit: 2017-09-22 | Discharge: 2017-09-22 | Disposition: A | Discharge status: Home / Self Care | Payer: 59 | Source: Ambulatory Visit | Attending: Radiation Oncology | Admitting: Radiation Oncology

## 2017-09-22 ENCOUNTER — Encounter: Payer: Self-pay | Admitting: Hematology

## 2017-09-22 ENCOUNTER — Ambulatory Visit: Payer: 59 | Admitting: Radiation Oncology

## 2017-09-22 ENCOUNTER — Other Ambulatory Visit (HOSPITAL_BASED_OUTPATIENT_CLINIC_OR_DEPARTMENT_OTHER): Payer: 59

## 2017-09-22 VITALS — BP 110/56 | HR 92 | Temp 98.4°F | Resp 17 | Ht 65.0 in | Wt 173.2 lb

## 2017-09-22 DIAGNOSIS — Z510 Encounter for antineoplastic radiation therapy: Secondary | ICD-10-CM | POA: Diagnosis not present

## 2017-09-22 DIAGNOSIS — Z170 Estrogen receptor positive status [ER+]: Secondary | ICD-10-CM | POA: Diagnosis not present

## 2017-09-22 DIAGNOSIS — R059 Cough, unspecified: Secondary | ICD-10-CM

## 2017-09-22 DIAGNOSIS — C50511 Malignant neoplasm of lower-outer quadrant of right female breast: Secondary | ICD-10-CM

## 2017-09-22 DIAGNOSIS — R05 Cough: Secondary | ICD-10-CM | POA: Diagnosis not present

## 2017-09-22 LAB — CBC WITH DIFFERENTIAL/PLATELET
BASO%: 0.1 % (ref 0.0–2.0)
BASOS ABS: 0 10*3/uL (ref 0.0–0.1)
EOS ABS: 0.2 10*3/uL (ref 0.0–0.5)
EOS%: 2.2 % (ref 0.0–7.0)
HEMATOCRIT: 38.9 % (ref 34.8–46.6)
HEMOGLOBIN: 12.9 g/dL (ref 11.6–15.9)
LYMPH#: 1.3 10*3/uL (ref 0.9–3.3)
LYMPH%: 18.1 % (ref 14.0–49.7)
MCH: 29.3 pg (ref 25.1–34.0)
MCHC: 33.2 g/dL (ref 31.5–36.0)
MCV: 88.4 fL (ref 79.5–101.0)
MONO#: 0.5 10*3/uL (ref 0.1–0.9)
MONO%: 7 % (ref 0.0–14.0)
NEUT%: 72.6 % (ref 38.4–76.8)
NEUTROS ABS: 5.4 10*3/uL (ref 1.5–6.5)
PLATELETS: 218 10*3/uL (ref 145–400)
RBC: 4.4 10*6/uL (ref 3.70–5.45)
RDW: 13.9 % (ref 11.2–14.5)
WBC: 7.4 10*3/uL (ref 3.9–10.3)

## 2017-09-22 LAB — COMPREHENSIVE METABOLIC PANEL
ALT: 12 U/L (ref 0–55)
ANION GAP: 11 meq/L (ref 3–11)
AST: 17 U/L (ref 5–34)
Albumin: 3.8 g/dL (ref 3.5–5.0)
Alkaline Phosphatase: 106 U/L (ref 40–150)
BUN: 9.2 mg/dL (ref 7.0–26.0)
CALCIUM: 9.6 mg/dL (ref 8.4–10.4)
CHLORIDE: 101 meq/L (ref 98–109)
CO2: 25 mEq/L (ref 22–29)
Creatinine: 0.7 mg/dL (ref 0.6–1.1)
EGFR: >60 mL/min/{1.73_m2} (ref >60)
Glucose: 108 mg/dl (ref 70–140)
POTASSIUM: 3.9 meq/L (ref 3.5–5.1)
Sodium: 137 mEq/L (ref 136–145)
Total Bilirubin: <0.22 mg/dL (ref 0.20–1.20)
Total Protein: 7.9 g/dL (ref 6.4–8.3)

## 2017-09-22 MED ORDER — GABAPENTIN 100 MG PO CAPS: ORAL_CAPSULE | ORAL | 2 refills | Status: DC

## 2017-09-22 MED ORDER — TAMOXIFEN CITRATE 20 MG PO TABS: 20.0000 mg | ORAL_TABLET | Freq: Every day | ORAL | 3 refills | Status: AC

## 2017-09-22 MED ORDER — PHENYLEPH-CHLORPHEN-CODEINE 5-2-10 MG/5ML PO SYRP: 5.0000 mL | ORAL_SOLUTION | Freq: Two times a day (BID) | ORAL | 0 refills | Status: DC | PRN

## 2017-09-23 ENCOUNTER — Ambulatory Visit: Payer: 59

## 2017-09-23 ENCOUNTER — Ambulatory Visit
Admission: RE | Admit: 2017-09-23 | Discharge: 2017-09-23 | Disposition: A | Discharge status: Home / Self Care | Payer: 59 | Source: Ambulatory Visit | Attending: Radiation Oncology | Admitting: Radiation Oncology

## 2017-09-23 ENCOUNTER — Telehealth: Payer: Self-pay

## 2017-09-23 DIAGNOSIS — Z510 Encounter for antineoplastic radiation therapy: Secondary | ICD-10-CM | POA: Diagnosis not present

## 2017-09-23 NOTE — Telephone Encounter (Signed)
Spoke with patient concerning upcoming appointment. Patient says she have the information on my chart. Per 10/29 los

## 2017-09-24 ENCOUNTER — Ambulatory Visit: Payer: 59

## 2017-09-24 ENCOUNTER — Ambulatory Visit: Payer: 59 | Admitting: Radiation Oncology

## 2017-09-24 ENCOUNTER — Ambulatory Visit
Admission: RE | Admit: 2017-09-24 | Discharge: 2017-09-24 | Disposition: A | Discharge status: Home / Self Care | Payer: 59 | Source: Ambulatory Visit | Attending: Radiation Oncology | Admitting: Radiation Oncology

## 2017-09-24 DIAGNOSIS — Z510 Encounter for antineoplastic radiation therapy: Secondary | ICD-10-CM | POA: Diagnosis not present

## 2017-09-25 ENCOUNTER — Ambulatory Visit: Payer: 59

## 2017-09-25 ENCOUNTER — Ambulatory Visit
Admission: RE | Admit: 2017-09-25 | Discharge: 2017-09-25 | Disposition: A | Discharge status: Home / Self Care | Payer: 59 | Source: Ambulatory Visit | Attending: Radiation Oncology | Admitting: Radiation Oncology

## 2017-09-25 ENCOUNTER — Other Ambulatory Visit: Payer: Self-pay | Admitting: Radiation Oncology

## 2017-09-25 DIAGNOSIS — C50511 Malignant neoplasm of lower-outer quadrant of right female breast: Secondary | ICD-10-CM

## 2017-09-25 DIAGNOSIS — Z510 Encounter for antineoplastic radiation therapy: Secondary | ICD-10-CM | POA: Diagnosis not present

## 2017-09-25 MED ORDER — OXYCODONE-ACETAMINOPHEN 5-325 MG PO TABS
1.0000 | ORAL_TABLET | Freq: Once | ORAL | Status: AC
  Administered 2017-09-25: 1 via ORAL
  Filled 2017-09-25: qty 1

## 2017-09-25 MED ORDER — OXYCODONE HCL 5 MG PO TABS: 5.0000 mg | ORAL_TABLET | ORAL | 0 refills | Status: DC | PRN

## 2017-09-26 ENCOUNTER — Ambulatory Visit
Admission: RE | Admit: 2017-09-26 | Discharge: 2017-09-26 | Disposition: A | Discharge status: Home / Self Care | Payer: 59 | Source: Ambulatory Visit | Attending: Radiation Oncology | Admitting: Radiation Oncology

## 2017-09-26 ENCOUNTER — Ambulatory Visit: Payer: 59

## 2017-09-26 DIAGNOSIS — Z510 Encounter for antineoplastic radiation therapy: Secondary | ICD-10-CM | POA: Diagnosis not present

## 2017-09-29 ENCOUNTER — Ambulatory Visit
Admission: RE | Admit: 2017-09-29 | Discharge: 2017-09-29 | Disposition: A | Discharge status: Home / Self Care | Payer: 59 | Source: Ambulatory Visit | Attending: Radiation Oncology | Admitting: Radiation Oncology

## 2017-09-29 ENCOUNTER — Ambulatory Visit: Payer: 59

## 2017-09-29 ENCOUNTER — Encounter: Payer: Self-pay | Admitting: Radiation Oncology

## 2017-09-29 ENCOUNTER — Ambulatory Visit: Payer: 59 | Admitting: Radiation Oncology

## 2017-09-29 ENCOUNTER — Telehealth: Payer: Self-pay | Admitting: *Deleted

## 2017-09-29 ENCOUNTER — Other Ambulatory Visit: Payer: Self-pay | Admitting: Radiation Oncology

## 2017-09-29 DIAGNOSIS — Z510 Encounter for antineoplastic radiation therapy: Secondary | ICD-10-CM | POA: Diagnosis not present

## 2017-09-29 MED ORDER — LEVOFLOXACIN 750 MG PO TABS: 750.0000 mg | ORAL_TABLET | Freq: Every day | ORAL | 0 refills | Status: DC

## 2017-09-29 MED ORDER — PREDNISONE 20 MG PO TABS: ORAL_TABLET | ORAL | 0 refills | Status: DC

## 2017-09-29 NOTE — Telephone Encounter (Addendum)
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

## 2017-09-29 NOTE — Progress Notes (Signed)
Went to the ED in [facility] Saturday 09/27/17 had Ct chest done, and was placed on prednisone  40mg  daily(2  =20mg )right chest wall/breast using sonafine cream, allergy to sulfa,  Pain 6/10 scale, took benadryl also, took oxycodone 5mg   2 tabs 3-4 hours ago 2:33 PM BP 132/72   Pulse 87   Temp 98.2 F (36.8 C) (Core)   Resp 20   Wt 178 lb (80.7 kg)   BMI 29.62 kg/m   Wt Readings from Last 3 Encounters:  09/29/17 178 lb (80.7 kg)  09/22/17 173 lb 3.2 oz (78.6 kg)  07/17/17 174 lb 4 oz (79 kg)

## 2017-09-30 ENCOUNTER — Ambulatory Visit: Payer: 59

## 2017-10-01 ENCOUNTER — Ambulatory Visit: Payer: 59

## 2017-10-01 ENCOUNTER — Ambulatory Visit
Admission: RE | Admit: 2017-10-01 | Discharge: 2017-10-01 | Disposition: A | Discharge status: Home / Self Care | Payer: 59 | Source: Ambulatory Visit | Attending: Radiation Oncology | Admitting: Radiation Oncology

## 2017-10-01 ENCOUNTER — Encounter: Payer: Self-pay | Admitting: Radiation Oncology

## 2017-10-01 DIAGNOSIS — Z510 Encounter for antineoplastic radiation therapy: Secondary | ICD-10-CM | POA: Diagnosis not present

## 2017-10-01 NOTE — Progress Notes (Signed)
The patient was seen for a work in visit prior to her radiotherapy to the chest wall and regional nodes. She is receiving postmastectomy therapy and has been having trouble with wet desquamation. She continues to use hydrogel pads and is using oxycodone for pain relief. She also had symptoms of a viral URI last week and we discussed that we would follow her clinically regarding this. She developed acute onset of SOB over the weekend and started having temperatures of 100-101. She continued to have a cough and was seen in [place] in the ED. She has bilateral ground glass changes in both lungs and blebs. No evidence of consolidation or infiltrate was noted. She was told that she had radiation pneumonitis by the provider and was started on Prednisone 40 mg daily. After reviewing her imaging with Dr. Tammi Klippel it does not appear that there is any evidence radiographically of radiation pneumonitis. Given her symptoms, we would recommend adding levaquin, and tapering her prednisone. She was given a 3 week taper instruction for her predniose. She will keep [place] informed of her progress during the remainder of this week, and let us know if she's having any progressive symptoms or recurrence of shortness of breath. Chest is clear in the upper fields bilaterally, but in the bases, there are crackles throughout.      Carola Rhine, PAC

## 2017-10-02 ENCOUNTER — Ambulatory Visit
Admission: RE | Admit: 2017-10-02 | Discharge: 2017-10-02 | Disposition: A | Discharge status: Home / Self Care | Payer: 59 | Source: Ambulatory Visit | Attending: Radiation Oncology | Admitting: Radiation Oncology

## 2017-10-02 ENCOUNTER — Ambulatory Visit: Payer: 59

## 2017-10-02 DIAGNOSIS — Z510 Encounter for antineoplastic radiation therapy: Secondary | ICD-10-CM | POA: Diagnosis not present

## 2017-10-03 ENCOUNTER — Ambulatory Visit: Payer: 59

## 2017-10-03 ENCOUNTER — Encounter: Payer: Self-pay | Admitting: *Deleted

## 2017-10-03 ENCOUNTER — Encounter: Payer: Self-pay | Admitting: Radiation Oncology

## 2017-10-03 ENCOUNTER — Ambulatory Visit
Admission: RE | Admit: 2017-10-03 | Discharge: 2017-10-03 | Disposition: A | Discharge status: Home / Self Care | Payer: 59 | Source: Ambulatory Visit | Attending: Radiation Oncology | Admitting: Radiation Oncology

## 2017-10-03 DIAGNOSIS — Z510 Encounter for antineoplastic radiation therapy: Secondary | ICD-10-CM | POA: Diagnosis not present

## 2017-10-03 MED ORDER — OXYCODONE HCL 5 MG PO TABS: 5.0000 mg | ORAL_TABLET | Freq: Four times a day (QID) | ORAL | 0 refills | Status: DC | PRN

## 2017-10-06 ENCOUNTER — Ambulatory Visit: Payer: 59

## 2017-10-06 ENCOUNTER — Encounter: Payer: Self-pay | Admitting: *Deleted

## 2017-10-07 ENCOUNTER — Ambulatory Visit: Admission: RE | Admit: 2017-10-07 | Payer: 59 | Source: Ambulatory Visit

## 2017-10-08 ENCOUNTER — Ambulatory Visit: Payer: 59

## 2017-10-08 ENCOUNTER — Telehealth: Payer: Self-pay | Admitting: *Deleted

## 2017-10-08 NOTE — Telephone Encounter (Signed)
Called patient and left vm,, she hasn't been here past 2 days for final treatment in radiation, Dr. Lisbeth Renshaw did encourage her to finish if she could but it wasn't necessary if she was in too much pain, asked for her to give a call back,   patient follow up appt with Shona Simpson on 10/23/17 at 3pm,  10:47 AM

## 2017-10-10 ENCOUNTER — Telehealth: Payer: Self-pay

## 2017-10-10 NOTE — Progress Notes (Signed)
  Radiation Oncology         [phone] ________________________________  Name: April Orozco MRN: [id number]  Date: 10/03/2017  DOB: 1966-08-16  End of Treatment Note  Diagnosis:   Right-sided breast cancer     Indication for treatment:  Curative       Radiation treatment dates:   08/11/2017 - 10/03/2017  Site/dose:   The patient initially received a dose of 50.4 Gy in 28 fractions to the breast using whole-breast tangent fields. This was delivered using a 3-D conformal technique. The patient then was to receive a 10 Gy boost to the seroma, but she stopped treatment early and only received 8 Gy. The total dose was 58.4 Gy.  Narrative: The patient had increasing skin irritation clinically and on exam as she progressed during treatment. Erythema and moist desquamation were present at the end of treatment. The boost area looked the best with dry desquamation, but there was moist desquamation above and below the boost treatment area.  She had difficulty with tolerating her pain during treatment, and she was given pain medication. Following her 32nd treatment, we discussed that she had one more fraction of radiation treatment left and that ideally I would like to finish this because the boost area was holding up to treatment relatively well. However, given the patient's level of discomfort, I believe it would be reasonable to forego the last treatment as well if necessary. She ultimately made the decision to forego her final treatment.  Plan: The patient has completed radiation treatment. The patient will return to radiation oncology clinic for routine followup in one month. I advised the patient to call or return sooner if they have any questions or concerns related to their recovery or treatment. ________________________________  ------------------------------------------------  Jodelle Gross, MD, PhD  This document serves as a record of services personally performed by Kyung Rudd, MD. It  was created on his behalf by Rae Lips, a trained medical scribe. The creation of this record is based on the scribe's personal observations and the provider's statements to them. This document has been checked and approved by the attending provider.

## 2017-10-10 NOTE — Telephone Encounter (Signed)
Called and confirmed appointment for April 2019. Patient declined confirmation letter due to mychart. Per 11/12 sch message

## 2017-10-14 ENCOUNTER — Encounter: Payer: Self-pay | Admitting: Radiation Oncology

## 2017-10-14 NOTE — Progress Notes (Signed)
April Orozco rec'd from the patient a request from [facility] for records and also documents for FMLA/Disability to be completed. I have placed records and request in a green folder for April Orozco to approve and the FMLA in an orange folder for RN to complete. Pt states that she wants to be called when they are ready.

## 2017-10-15 ENCOUNTER — Telehealth: Payer: Self-pay | Admitting: Radiation Oncology

## 2017-10-15 NOTE — Telephone Encounter (Signed)
I LVM for April Orozco to let me know what to do with her completed medical records request for [facility] she wanted to pick them up or for me to fax to them. I listed my phone number for her to call me back on this decision.

## 2017-10-20 ENCOUNTER — Encounter: Payer: Self-pay | Admitting: Radiation Oncology

## 2017-10-20 NOTE — Progress Notes (Signed)
I have called April Orozco to inform her that her FMLA paper work is now completed. She asked that it be placed at the front desk window for her to pick up on Thursday 11/29. Will scan copy to patient's chart.

## 2017-10-23 ENCOUNTER — Other Ambulatory Visit: Payer: Self-pay

## 2017-10-23 ENCOUNTER — Other Ambulatory Visit: Payer: Self-pay | Admitting: Radiation Oncology

## 2017-10-23 ENCOUNTER — Ambulatory Visit
Admission: RE | Admit: 2017-10-23 | Discharge: 2017-10-23 | Disposition: A | Discharge status: Home / Self Care | Payer: 59 | Source: Ambulatory Visit | Attending: Radiation Oncology | Admitting: Radiation Oncology

## 2017-10-23 ENCOUNTER — Encounter: Payer: Self-pay | Admitting: Radiation Oncology

## 2017-10-23 VITALS — BP 122/57 | HR 97 | Temp 98.5°F | Resp 18 | Ht 65.0 in | Wt 174.4 lb

## 2017-10-23 DIAGNOSIS — Z170 Estrogen receptor positive status [ER+]: Principal | ICD-10-CM

## 2017-10-23 DIAGNOSIS — C50511 Malignant neoplasm of lower-outer quadrant of right female breast: Secondary | ICD-10-CM

## 2017-10-23 DIAGNOSIS — Z510 Encounter for antineoplastic radiation therapy: Secondary | ICD-10-CM | POA: Diagnosis not present

## 2017-10-23 MED ORDER — ZOLPIDEM TARTRATE 10 MG PO TABS: 10.0000 mg | ORAL_TABLET | Freq: Every evening | ORAL | 0 refills | Status: DC | PRN

## 2017-10-23 NOTE — Progress Notes (Signed)
1615 Ambien 10 mg po at bedtime prn for sleep 30 tablets no refill called in to [facility] per Shona Simpson, PA-C.  Pharmacist will call April Orozco when the medication is ready for pick up later this evening.

## 2017-10-24 NOTE — Progress Notes (Addendum)
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    Interval Since Last Radiation:  3 weeks   08/11/2017 - 10/03/2017: The patient initially received a dose of 50.4 Gy in 28 fractions to the breast/chest wall and regional nodes using whole-breast tangent fields. This was delivered using a 3-D conformal technique. The patient then was to receive a 10 Gy boost to the seroma, but she stopped treatment early and only received 8 Gy. The total dose was 58.4 Gy.    Narrative:  The patient returns today for routine follow-up. During treatment she did very well with radiotherapy and did develop wet desquamation.  Gentian violet was applied and she tolerated this well.              On review of systems, the patient states she feels better. She has had some tightness in her right shoulder. She denies any progression in her lymphedema but continues to notice this.   ALLERGIES:  is allergic to banana; sulfur; and tramadol.  Meds: Current Outpatient Medications  Medication Sig Dispense Refill  . ALPRAZolam (XANAX) 0.5 MG tablet Take 1 tablet (0.5 mg total) by mouth at bedtime as needed for anxiety. 20 tablet 0  . ibuprofen (ADVIL,MOTRIN) 200 MG tablet Take 800 mg by mouth every 6 (six) hours as needed for headache, mild pain or moderate pain.    . methocarbamol (ROBAXIN) 500 MG tablet Take 1 tablet (500 mg total) by mouth 4 (four) times daily -  before meals and at bedtime. 30 tablet 0  . gabapentin (NEURONTIN) 100 MG capsule TAKE 1 CAPSULE BY MOUTH THREE TIMES A DAY 90 capsule 2  . Phenyleph-Chlorphen-Codeine 03-26-09 MG/5ML SYRP Take 5 mLs by mouth 2 (two) times daily as needed. (Patient not taking: Reported on 10/23/2017) 240 mL 0    . tamoxifen (NOLVADEX) 20 MG tablet Take 1 tablet (20 mg total) by mouth daily. 30 tablet 3  . zolpidem (AMBIEN) 10 MG tablet Take 1 tablet (10 mg total) by mouth at bedtime as needed for sleep. 30 tablet 0   No current facility-administered medications for this encounter.     Physical Findings:  height is 5\' 5"  (1.651 m) and weight is 174 lb 6.4 oz (79.1 kg). Her oral temperature is 98.5 F (36.9 C). Her blood pressure is 122/57 (abnormal) and her pulse is 97. Her respiration is 18 and oxygen saturation is 99%.  Pain Assessment Pain Score: 0-No pain/10 In general this is a well appearing caucasian female in no acute distress. She's alert and oriented x4 and appropriate throughout the examination. Cardiopulmonary assessment is negative for acute distress and she exhibits normal effort. The right chest wall was examined and reveals gentian violet, and a well healing mastectomy scar. No evidence of wet desquamation was noted. She still has mild pitting edema of the RUE.   Lab Findings: Lab Results  Component Value Date   WBC 7.4 09/22/2017   HGB 12.9 09/22/2017   HCT 38.9 09/22/2017   MCV 88.4 09/22/2017   PLT 218 09/22/2017     Radiographic Findings: No results found.  Impression/Plan: 1.  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 The patient has  been doing well since completion of radiotherapy. We discussed that we would be happy to continue to follow her as needed, but she will also continue to follow up with Dr. Burr Medico in medical oncology. She was counseled on skin care as well as measures to avoid sun exposure to this area.  2. Range of motion concerns/lymphedema. We have referred her back to PT at [facility].      Carola Rhine, PAC

## 2017-11-12 ENCOUNTER — Telehealth: Payer: Self-pay | Admitting: *Deleted

## 2017-11-12 NOTE — Telephone Encounter (Signed)
Pt called requesting a call back from nurse due to having pain.  Spoke with pt and was informed that she experiences increasing pain in both legs and joints pain - from groin to knee caps.  Stated has had pain before but gradually worsened.  Pt has been taking Ibuprofen 12 tabs a day for pain - with little relief.  Denied legs  swelling, denied redness or tenderness to touch.  Stated bowel and bladder function fine.  Stated the pain interferes with mobility somewhat.  Pt stated she had taken PERCOCET before with relief.  Dr. Burr Medico notified. Pt's   Phone     [phone].

## 2017-11-13 ENCOUNTER — Encounter: Payer: Self-pay | Admitting: Hematology

## 2017-11-20 IMAGING — MR MR BILATERAL BREAST WITHOUT AND WITH CONTRAST
4 of 10 series · 18 of 48 positions shown · IV contrast (Yes)
Comparison: Previous exams including diagnostic mammogram and
ultrasound dated 09/10/2016.

CLINICAL DATA: New right breast cancer, neoadjuvant chemotherapy.

LABS:  Not applicable
EXAM:
BILATERAL BREAST MRI WITH AND WITHOUT CONTRAST
TECHNIQUE: Multiplanar, multisequence MR images of both breasts were obtained
prior to and following the intravenous administration of 17 ml of
MultiHance.

[Series 4: ax ir · axial · 3.0mm · 0.70mm/px · z∈[-53,+166]mm · 2 of 74 slices shown]
[im 1/74]
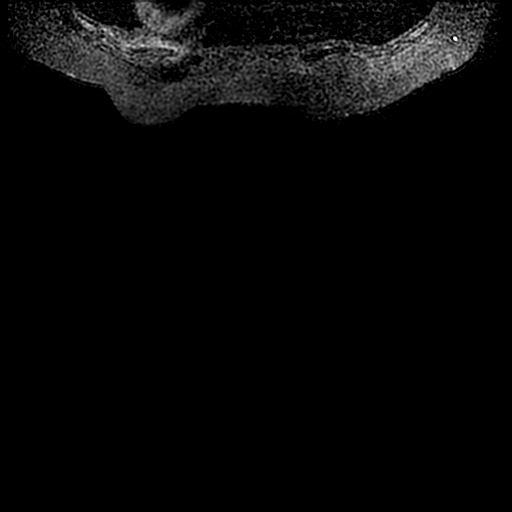
[im 74/74]
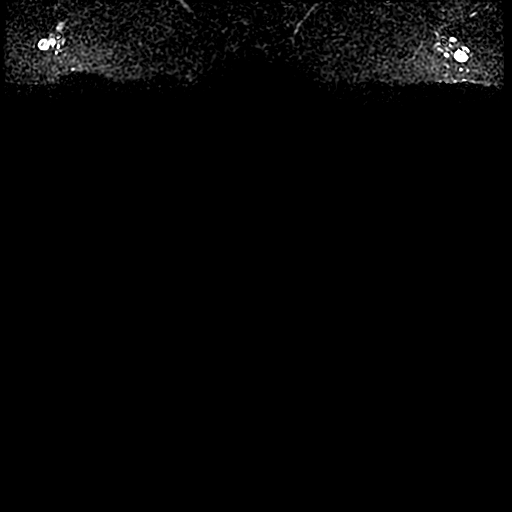

[Series 600: vibrant mph +c · axial · 1.8mm · 0.70mm/px · z∈[-51,+164]mm · 6 of 240 slices shown]
[im 1/240]
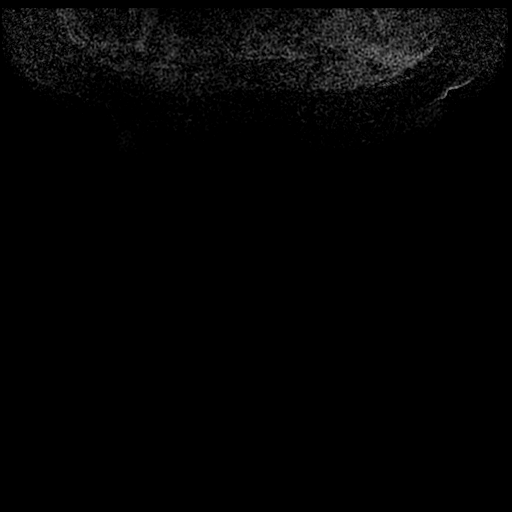
[im 48/240]
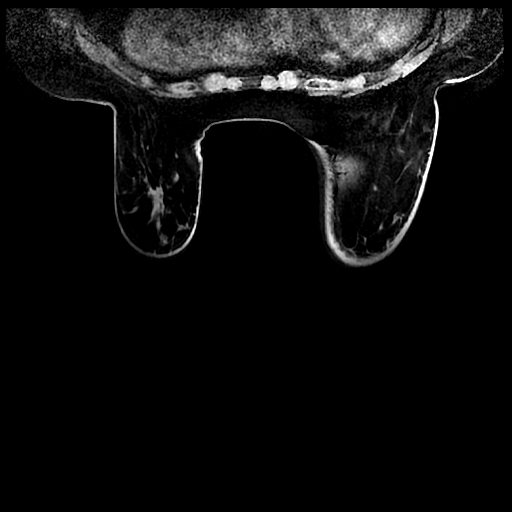
[im 96/240]
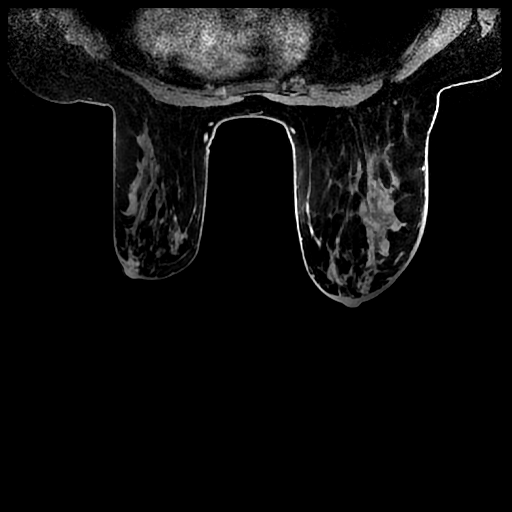
[im 144/240]
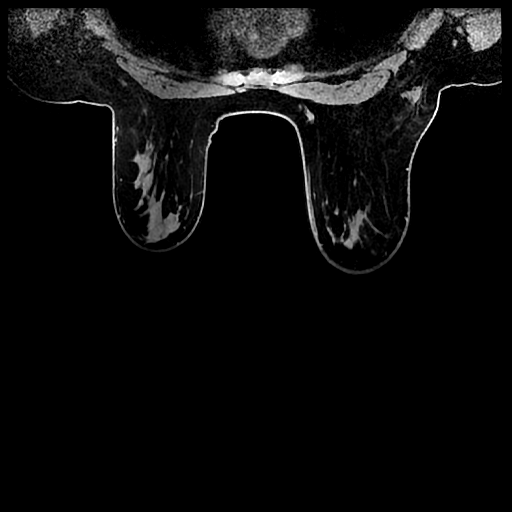
[im 192/240]
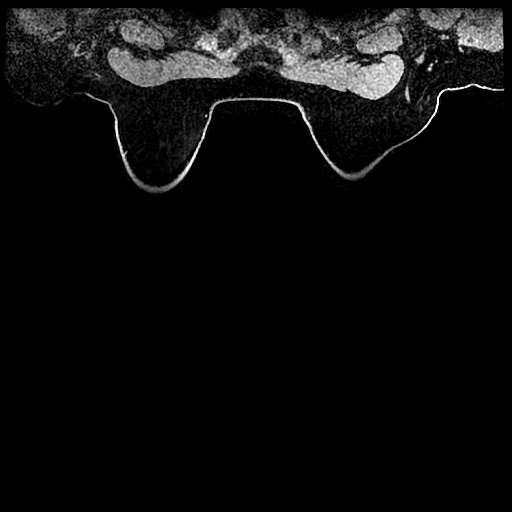
[im 240/240]
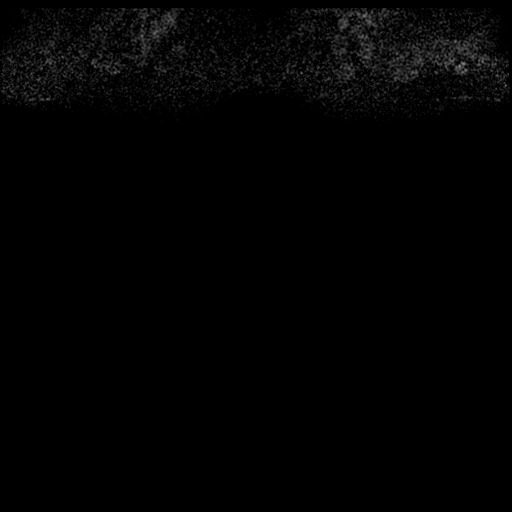

[Series 601: ph1/ vibrant mph · axial · 1.8mm · 0.70mm/px · z∈[-51,+164]mm · 6 of 240 slices shown]
[im 1/240]
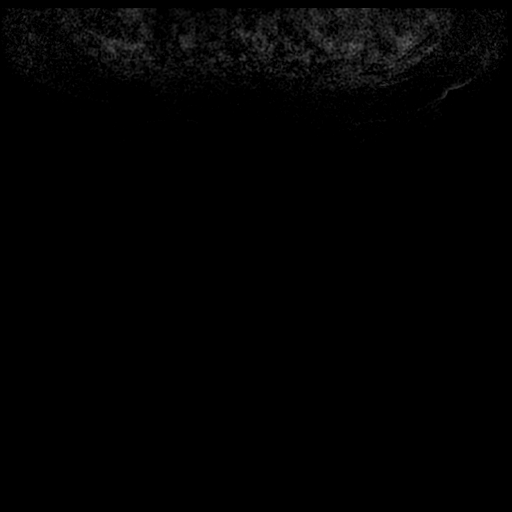
[im 48/240]
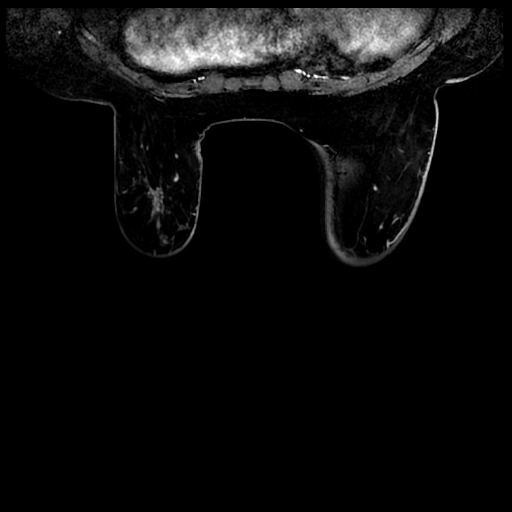
[im 96/240]
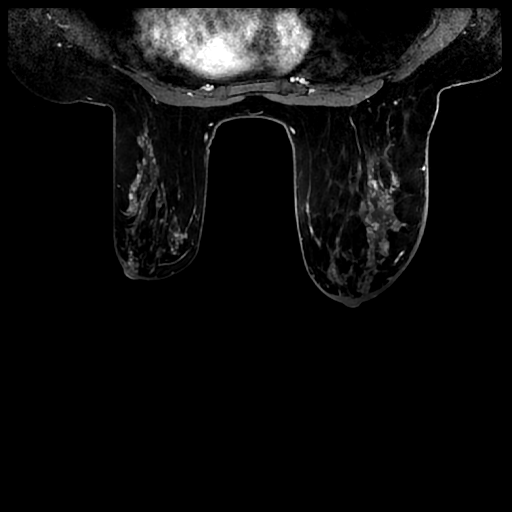
[im 144/240]
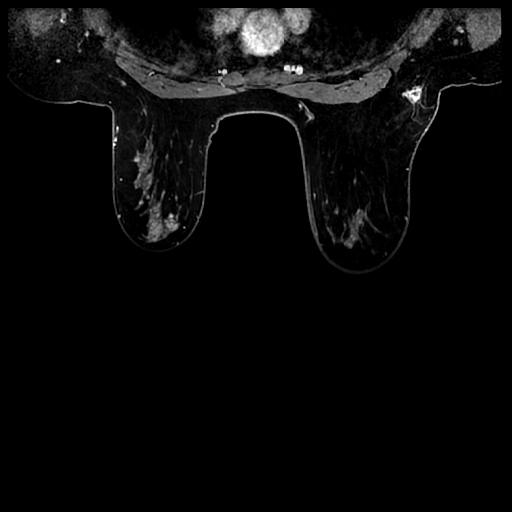
[im 192/240]
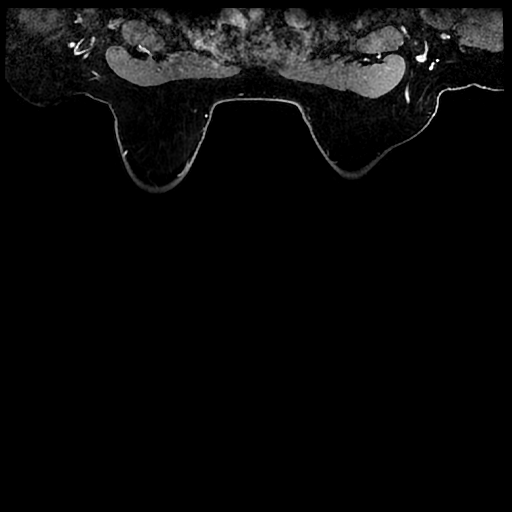
[im 240/240]
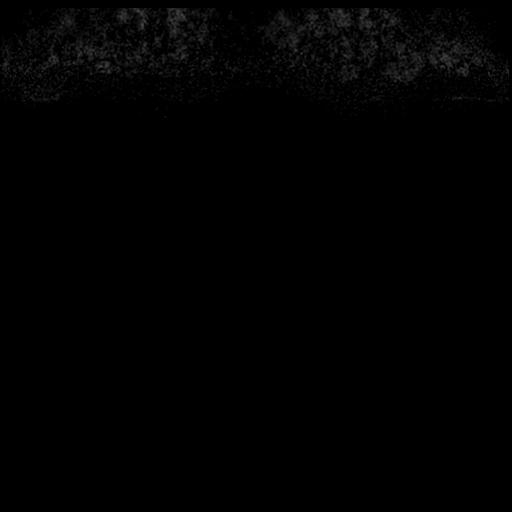

[Series 602: ph2/ vibrant mph · axial · 1.8mm · 0.70mm/px · z∈[-51,+164]mm · 4 of 240 slices shown]
[im 1/240]
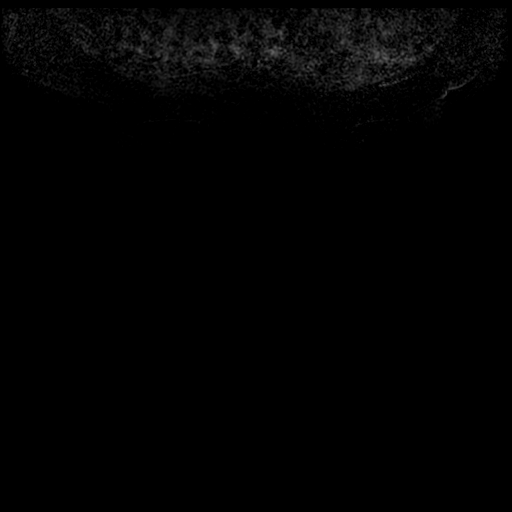
[im 48/240]
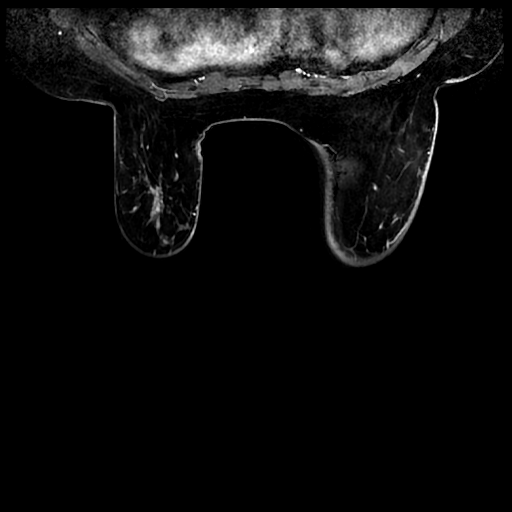
[im 144/240]
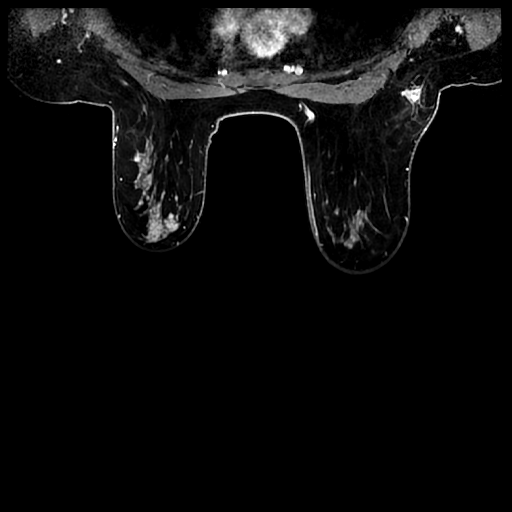
[im 240/240]
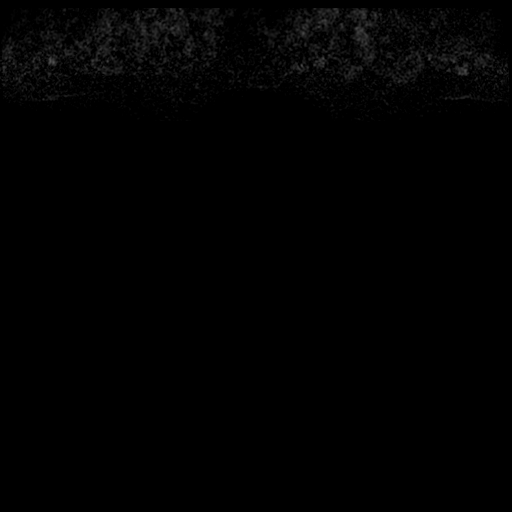

[18 of 48 positions shown; findings below may reference images not displayed]

THREE-DIMENSIONAL MR IMAGE RENDERING ON INDEPENDENT WORKSTATION:

Three-dimensional MR images were rendered by post-processing of the
original MR data on an independent workstation. The
three-dimensional MR images were interpreted, and findings are
reported in the following complete MRI report for this study. Three
dimensional images were evaluated at the independent [employer]
workstation
FINDINGS: Breast composition: c. Heterogeneous fibroglandular tissue.

Background parenchymal enhancement: Marked

Right breast: Irregular mass within the lower outer quadrant of the
right breast, at middle depth, measuring 2.7 x 2.4 x 2.5 cm, with
associated biopsy clip artifact, consistent with biopsy proven right
breast cancer.

No additional suspicious enhancing masses, non mass enhancement or
secondary signs of malignancy within the right breast. Scattered
small cysts.

Left breast: No suspicious enhancing masses, non mass enhancement or
secondary signs of malignancy within the left breast. Scattered
small cysts.

Lymph nodes: Mildly prominent lymph node in the right axilla, with
surrounding edema, with associated biopsy clip artifact, consistent
with recently biopsied lymph node.

No additional enlarged or morphologically abnormal lymph nodes
within the right axilla. No enlarged or morphologically abnormal
lymph nodes identified within the left axilla or within either
internal mammary chain region.

Ancillary findings:  None.
IMPRESSION: 1. Biopsy-proven right breast cancer, lower outer quadrant,
measuring 2.7 x 2.4 x 2.5 cm, with associated biopsy clip.
2. No MRI evidence of multifocal or multicentric disease in the
right breast.
3. No MRI evidence of malignancy in the left breast.
4. No enlarged or morphologically abnormal lymph nodes. Mildly
prominent lymph node, with surrounding edema, in the right axilla,
consistent with recently biopsied lymph node.

RECOMMENDATION:
Per current treatment plan.

BI-RADS CATEGORY  6: Known biopsy-proven malignancy.

## 2017-12-23 ENCOUNTER — Inpatient Hospital Stay: Payer: 59

## 2017-12-23 ENCOUNTER — Encounter: Payer: Self-pay | Admitting: Nurse Practitioner

## 2017-12-23 ENCOUNTER — Inpatient Hospital Stay: Payer: 59 | Attending: Nurse Practitioner | Admitting: Nurse Practitioner

## 2017-12-23 VITALS — BP 119/72 | HR 98 | Temp 98.6°F | Resp 18 | Ht 65.0 in | Wt 174.3 lb

## 2017-12-23 DIAGNOSIS — C50511 Malignant neoplasm of lower-outer quadrant of right female breast: Secondary | ICD-10-CM | POA: Insufficient documentation

## 2017-12-23 DIAGNOSIS — Z170 Estrogen receptor positive status [ER+]: Secondary | ICD-10-CM | POA: Insufficient documentation

## 2017-12-23 DIAGNOSIS — Z791 Long term (current) use of non-steroidal anti-inflammatories (NSAID): Secondary | ICD-10-CM | POA: Insufficient documentation

## 2017-12-23 DIAGNOSIS — Z79810 Long term (current) use of selective estrogen receptor modulators (SERMs): Secondary | ICD-10-CM | POA: Insufficient documentation

## 2017-12-23 DIAGNOSIS — F419 Anxiety disorder, unspecified: Secondary | ICD-10-CM | POA: Diagnosis not present

## 2017-12-23 DIAGNOSIS — G629 Polyneuropathy, unspecified: Secondary | ICD-10-CM | POA: Insufficient documentation

## 2017-12-23 DIAGNOSIS — G4700 Insomnia, unspecified: Secondary | ICD-10-CM | POA: Insufficient documentation

## 2017-12-23 DIAGNOSIS — Z923 Personal history of irradiation: Secondary | ICD-10-CM | POA: Insufficient documentation

## 2017-12-23 LAB — CBC WITH DIFFERENTIAL/PLATELET
Basophils Absolute: 0 10*3/uL (ref 0.0–0.1)
Basophils Relative: 0 %
EOS PCT: 2 %
Eosinophils Absolute: 0.1 10*3/uL (ref 0.0–0.5)
HCT: 42.8 % (ref 34.8–46.6)
Hemoglobin: 14.1 g/dL (ref 11.6–15.9)
LYMPHS ABS: 1.4 10*3/uL (ref 0.9–3.3)
LYMPHS PCT: 23 %
MCH: 29.8 pg (ref 25.1–34.0)
MCHC: 32.9 g/dL (ref 31.5–36.0)
MCV: 90.5 fL (ref 79.5–101.0)
MONO ABS: 0.3 10*3/uL (ref 0.1–0.9)
MONOS PCT: 5 %
NEUTROS ABS: 4.4 10*3/uL (ref 1.5–6.5)
Neutrophils Relative %: 70 %
Platelets: 243 10*3/uL (ref 145–400)
RBC: 4.73 MIL/uL (ref 3.70–5.45)
RDW: 14.1 % (ref 11.2–16.1)
WBC: 6.3 10*3/uL (ref 3.9–10.3)

## 2017-12-23 LAB — COMPREHENSIVE METABOLIC PANEL
ALT: 15 U/L (ref 0–55)
AST: 19 U/L (ref 5–34)
Albumin: 4 g/dL (ref 3.5–5.0)
Alkaline Phosphatase: 100 U/L (ref 40–150)
Anion gap: 12 — ABNORMAL HIGH (ref 3–11)
BUN: 14 mg/dL (ref 7–26)
CO2: 26 mmol/L (ref 22–29)
Calcium: 9.7 mg/dL (ref 8.4–10.4)
Chloride: 100 mmol/L (ref 98–109)
Creatinine, Ser: 0.79 mg/dL (ref 0.60–1.10)
GFR calc Af Amer: >60 mL/min (ref >60)
GFR calc non Af Amer: >60 mL/min (ref >60)
GLUCOSE: 144 mg/dL — AB (ref 70–140)
POTASSIUM: 3.8 mmol/L (ref 3.3–4.7)
SODIUM: 138 mmol/L (ref 136–145)
TOTAL PROTEIN: 8.2 g/dL (ref 6.4–8.3)

## 2017-12-23 MED ORDER — ZOLPIDEM TARTRATE 10 MG PO TABS: 10.0000 mg | ORAL_TABLET | Freq: Every evening | ORAL | 0 refills | Status: DC | PRN

## 2017-12-23 MED ORDER — ALPRAZOLAM 0.5 MG PO TABS: 0.5000 mg | ORAL_TABLET | Freq: Every evening | ORAL | 0 refills | Status: DC | PRN

## 2017-12-23 NOTE — Progress Notes (Signed)
[name]  [phone] [phone]  Clinic Follow up Note   Patient Care Team: Patient, No Pcp Per as PCP - General (General Practice) Rolm Bookbinder, MD as Consulting Physician (General Surgery) Truitt Merle, MD as Consulting Physician (Hematology) Kyung Rudd, MD as Consulting Physician (Radiation Oncology) 12/23/2017  CHIEF COMPLAINT: Follow up right breast cancer  SUMMARY OF ONCOLOGIC HISTORY: Oncology History   Cancer Staging Malignant neoplasm of lower-outer quadrant of right female breast [facility]) Staging form: Breast, AJCC 7th Edition - Clinical stage from 09/12/2016: Stage IIB (T2, N1, M0) - Signed by Truitt Merle, MD on 09/18/2016 - Pathologic stage from 04/07/2017: Stage IIB (T2, N1a, cM0) - Signed by Truitt Merle, MD on 05/10/2017       Malignant neoplasm of lower-outer quadrant of right female breast ([place])   09/10/2016 Mammogram    Mammogram and ultrasound showed a 2.7 cm mass at 8 clock position of the right breast, there is also a lobulated lymph nodes in the right axilla, slightly suspicious for malignancy.       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 Docetaxel 72m/m2 on 01/24/17, was to be given every 3 weeks but stopped due to worsening neuropathy (only 1 cycle given)      11/20/2016 Genetic Testing    Negative  genetic testing on the Breast/Ovarian cancer panel.  Negative genetic testing for the MSH2 inversion analysis ([place] inversion). The Breast/Ovarian gene panel offered by [facility] includes sequencing and rearrangement analysis for the following 20 genes:  ATM, BARD1, BRCA1, BRCA2, BRIP1, CDH1, CHEK2, EPCAM, FANCC, MLH1, MSH2, MSH6, NBN, PALB2, PMS2, PTEN, RAD51C, RAD51D, TP53, and XRCC2.   The report date is December 19 for the [facility] and November 20, 2016 for the [facility].      02/14/2017 -  Anti-estrogen oral therapy    Tamoxifen 20 mg daily starting 02/14/17, held since surgery on 04/07/2017, restart 05/22/17      02/22/2017 Breast MRI    IMPRESSION: 1. Significantly smaller mass within the lower outer quadrant of the right breast, now measuring 1.5 cm (previously 2.7 cm). 2. Smaller right axillary lymph nodes.      04/07/2017 Surgery    RIGHT SKIN SPARING MASTECTOMY WITH RADIOACTIVE SEED GUIDED AXILLARY LYMPH NODE EXCISION AND AXILLARY SENTINEL LYMPH NODE BIOPSY, LEFT PROPHYLACTIC SKIN SPARING MASTECTOMY by Dr. WDonne Hazel      04/07/2017 Pathology Results    Diagnosis 04/07/17 1. Breast, simple mastectomy, Left Prophylactic Skin Sparing - FIBROCYSTIC CHANGES WITH SCLEROSING ADENOSIS AND CALCIFICATIONS. - LOBULAR NEOPLASIA (ATYPICAL LOBULAR HYPERPLASIA). 2. Breast, simple mastectomy, Right Skin Sparing - INVASIVE AND IN SITU DUCTAL CARCINOMA, 2.1 CM. - MARGINS NOT INVOLVED. - FIBROCYSTIC CHANGES WITH CALCIFICATIONS AND FOCAL ATYPICAL DUCTAL HYPERPLASIA. - METASTATIC CARCINOMA IN ONE LYMPH NODE WITH ASSOCIATED BIOPSY CAVITY (1/1). 3. Lymph nodes, regional resection, Right  Axillary - METASTATIC CARCINOMA IN TWO OF SIX LYMPH NODES (2/6).      04/23/2017 Surgery    RIGHT AXILLARY LYMPH NODE DISSECTION by Dr. Donne Hazel       04/23/2017 Pathology Results    Diagnosis 04/23/17 Lymph nodes, regional resection, Right Axillary Contents - NO CARCINOMA IDENTIFIED IN EIGHT LYMPH NODES  (0/8)        06/03/2017 Pathology Results    Skin right breast mastectomy and wound with results of: Skin with ulceration, acute suppurative inflammation and foci of suture material with giant cell reaction. No evidence of malignancy.       08/11/2017 -  Radiation Therapy    Adjuvant breast and axilla radiation      CURRENT THERAPY: Tamoxifen 20 mg daily started 02/14/17, held since surgery on 04/07/2017, restarted 05/22/17   INTERVAL HISTORY: [name] returns for follow up as scheduled. She has bilateral hip bone pain that radiates down to her knees. Without NSAIDs pain increases to 8/10; she takes 6-8 tablets per day. Pain began in 09/2017 and has progressed since then, occasionally limits ambulation. Denies bladder or bowel incontinence. She was on tamoxifen intermittently since 01/2017, held during surgery and recovery from wound dehiscence; she restarted taking tamoxifen consistently around 07/2017 during radiation therapy. Denies starting any other new medication, no injury. Neuropathy has not improved much off chemotherapy; stable with 300 mg gabapentin at bedtime. Has mild but manageable hot flashes, better overall since being off chemotherapy. Continues to report right UE and chest lymphedema; has compression machine that helps some. She did not notice much improvement while in PT. Right arm feels heavy after working all day.   REVIEW OF SYSTEMS:   Constitutional: Denies fatigue, fevers, chills or abnormal weight loss (+) hot flashes, improved  Eyes: Denies blurriness of vision Ears, nose, mouth, throat, and face: Denies mucositis or sore throat Respiratory: Denies cough, dyspnea or wheezes Cardiovascular: Denies palpitation, chest discomfort or lower extremity swelling  Gastrointestinal:  Denies nausea, vomiting, constipation, diarrhea, heartburn or change in bowel habits Skin: Denies abnormal skin rashes Lymphatics: Denies new lymphadenopathy or easy bruising (+) RUE lymphedema (+)  right chest/axilla lymphadenopathy  Neurological:Denies tingling or new weaknesses (+) numbness to fingers and toes bilaterally Behavioral/Psych: Mood is stable, no new changes (+) anxiety (+) insomnia  All other systems were reviewed with the patient and are negative.  MEDICAL HISTORY:  Past Medical History:  Diagnosis Date  . Fracture, sacrum/coccyx ([place]) 1970   "Shattered tailbone"  . Heart murmur    [facility]ovascular has evaluated, felt benign  . Malignant neoplasm of lower-outer quadrant of right female breast ([place]) 09/13/2016  . Tachycardia    Intermittent, resolved with decreased caffeine intake    SURGICAL HISTORY: Past Surgical History:  Procedure Laterality Date  . APPENDECTOMY    . AXILLARY LYMPH NODE DISSECTION Right 04/23/2017   Procedure: RIGHT AXILLARY LYMPH NODE DISSECTION;  Surgeon: Rolm Bookbinder, MD;  Location: [place];  Service: General;  Laterality: Right;  . BREAST LUMPECTOMY WITH AXILLARY LYMPH NODE BIOPSY Right 04/23/2017  . BREAST RECONSTRUCTION WITH PLACEMENT OF TISSUE EXPANDER AND FLEX HD (ACELLULAR HYDRATED DERMIS) Bilateral 04/07/2017   Procedure: BILATERAL BREAST RECONSTRUCTION WITH PLACEMENT OF TISSUE EXPANDER AND FLEX HD (ACELLULAR HYDRATED DERMIS);  Surgeon: Crissie Reese, MD;  Location: [place];  Service: Plastics;  Laterality: Bilateral;  . IR GENERIC HISTORICAL  01/10/2017   IR CV LINE INJECTION 01/10/2017 Greggory Keen, MD [facility]-INTERV RAD  . MASTECTOMY    . MASTECTOMY WITH RADIOACTIVE  SEED GUIDED EXCISION AND AXILLARY SENTINEL LYMPH NODE BIOPSY Bilateral 04/07/2017   Procedure: RIGHT SKIN SPARING MASTECTOMY WITH RADIOACTIVE SEED GUIDED AXILLARY LYMPH NODE EXCISION AND AXILLARY SENTINEL LYMPH NODE BIOPSY, LEFT PROPHYLACTIC SKIN SPARING MASTECTOMY;  Surgeon: Rolm Bookbinder, MD;  Location: [place];  Service: General;  Laterality: Bilateral;  . PORT-A-CATH REMOVAL N/A 04/07/2017   Procedure: REMOVAL PORT-A-CATH;  Surgeon: Rolm Bookbinder, MD;  Location:  [place];  Service: General;  Laterality: N/A;  . PORTACATH PLACEMENT Right 09/23/2016   Procedure: INSERTION PORT-A-CATH WITH [place];  Surgeon: Rolm Bookbinder, MD;  Location: [place];  Service: General;  Laterality: Right;  . TONSILECTOMY/ADENOIDECTOMY WITH MYRINGOTOMY    . TUBAL LIGATION      I have reviewed the social history and family history with the patient and they are unchanged from previous note.  ALLERGIES:  is allergic to banana; sulfur; and tramadol.  MEDICATIONS:  Current Outpatient Medications  Medication Sig Dispense Refill  . ALPRAZolam (XANAX) 0.5 MG tablet Take 1 tablet (0.5 mg total) by mouth at bedtime as needed for anxiety. 20 tablet 0  . gabapentin (NEURONTIN) 100 MG capsule TAKE 1 CAPSULE BY MOUTH THREE TIMES A DAY 90 capsule 2  . ibuprofen (ADVIL,MOTRIN) 200 MG tablet Take 800 mg by mouth every 6 (six) hours as needed for headache, mild pain or moderate pain.    . tamoxifen (NOLVADEX) 20 MG tablet Take 1 tablet (20 mg total) by mouth daily. 30 tablet 3  . zolpidem (AMBIEN) 10 MG tablet Take 1 tablet (10 mg total) by mouth at bedtime as needed for sleep. 30 tablet 0  . methocarbamol (ROBAXIN) 500 MG tablet Take 1 tablet (500 mg total) by mouth 4 (four) times daily -  before meals and at bedtime. 30 tablet 0  . Phenyleph-Chlorphen-Codeine 03-26-09 MG/5ML SYRP Take 5 mLs by mouth 2 (two) times daily as needed. (Patient not taking: Reported on 10/23/2017) 240 mL 0   No current facility-administered medications for this visit.     PHYSICAL EXAMINATION: ECOG PERFORMANCE STATUS: 1 - Symptomatic but completely ambulatory  Vitals:   12/23/17 1438  BP: 119/72  Pulse: 98  Resp: 18  Temp: 98.6 F (37 C)  SpO2: 98%   Filed Weights   12/23/17 1438  Weight: 174 lb 4.8 oz (79.1 kg)    GENERAL:alert, no distress and comfortable SKIN: skin color, texture, turgor are normal, no rashes or significant lesions EYES: normal, Conjunctiva are pink and  non-injected, sclera clear OROPHARYNX:no exudate, no erythema and lips, buccal mucosa, and tongue normal  NECK: supple, thyroid normal size, non-tender, without nodularity LYMPH:  no palpable cervical, supraclavicular, or axillary lymphadenopathy (+) mild right UE lymphedema  LUNGS: clear to auscultation bilaterally with normal breathing effort HEART: regular rate & rhythm and no murmurs and no lower extremity edema ABDOMEN:abdomen soft, non-tender and normal bowel sounds. No hepatomegaly Musculoskeletal:no cyanosis of digits and no clubbing  NEURO: alert & oriented x 3 with fluent speech, no focal motor deficits. (+) mildly decreased peripheral vibratory sense bilaterally per tuning fork exam BREASTS: S/p bilateral mastectomies with tissue expander in left breast, incision is well healed. Right tissue expander has been surgically removed, incision has healed well. Axillary incision is healed well. (+) Mild skin hyperpigmentation to right chest. (+) Mild right chest/axilla lymphedema  LABORATORY DATA:  I have reviewed the data as listed CBC Latest Ref Rng & Units 12/23/2017 09/22/2017 04/09/2017  WBC 3.9 - 10.3 K/uL 6.3 7.4 7.9  Hemoglobin 11.6 -  15.9 g/dL 14.1 12.9 11.3(L)  Hematocrit 34.8 - 46.6 % 42.8 38.9 34.3(L)  Platelets 145 - 400 K/uL 243 218 146(L)     CMP Latest Ref Rng & Units 12/23/2017 09/22/2017 04/09/2017  Glucose 70 - 140 mg/dL 144(H) 108 112(H)  BUN 7 - 26 mg/dL 14 9.2 7  Creatinine 0.60 - 1.10 mg/dL 0.79 0.7 0.71  Sodium 136 - 145 mmol/L 138 137 135  Potassium 3.3 - 4.7 mmol/L 3.8 3.9 3.7  Chloride 98 - 109 mmol/L 100 - 101  CO2 22 - 29 mmol/L _0 Calcium 8.4 - 10.4 mg/dL 9.7 9.6 8.6(L)  Total Protein 6.4 - 8.3 g/dL 8.2 7.9 -  Total Bilirubin 0.2 - 1.2 mg/dL <0.2(L) <0.22 -  Alkaline Phos 40 - 150 U/L 100 106 -  AST 5 - 34 U/L 19 17 -  ALT 0 - 55 U/L 15 12 -   PATHOLOGY REPORTS: NOSIS Diagnosis, 06/03/17 Skin , from right breast mastectomy, and wound - SKIN  WITH ULCERATION, ACUTE SUPPURATIVE INFLAMMATION AND FOCI OF SUTURE MATERIAL WITH GIANT CELL REACTION. - NO EVIDENCE OF MALIGNANCY.  Diagnosis 04/23/17 Lymph nodes, regional resection, Right Axillary Contents - NO CARCINOMA IDENTIFIED IN EIGHT LYMPH NODES (0/8)   Diagnosis 04/07/17 1. Breast, simple mastectomy, Left Prophylactic Skin Sparing - FIBROCYSTIC CHANGES WITH SCLEROSING ADENOSIS AND CALCIFICATIONS. - LOBULAR NEOPLASIA (ATYPICAL LOBULAR HYPERPLASIA). 2. Breast, simple mastectomy, Right Skin Sparing - INVASIVE AND IN SITU DUCTAL CARCINOMA, 2.1 CM. - MARGINS NOT INVOLVED. - FIBROCYSTIC CHANGES WITH CALCIFICATIONS AND FOCAL ATYPICAL DUCTAL HYPERPLASIA. - METASTATIC CARCINOMA IN ONE LYMPH NODE WITH ASSOCIATED BIOPSY CAVITY (1/1). 3. Lymph nodes, regional resection, Right Axillary - METASTATIC CARCINOMA IN TWO OF SIX LYMPH NODES (2/6). Microscopic Comment 2. BREAST, STATUS POST NEOADJUVANT TREATMENT Procedure: Skin sparring mastectomy and axillary lymph node excision. Laterality: Right breast. Tumor Size: 2.1 cm. Histologic Type: Ductal. Grade: III Tubular Differentiation: 3 Nuclear Pleomorphism: 3 Mitotic Count: 2 Ductal Carcinoma in Situ (DCIS): Present, high grade with necrosis. Regional Lymph Nodes: Number of Lymph Nodes Examined: 7 Number of Sentinel Lymph Nodes Examined: 0 Lymph Nodes with Macrometastases: 2 Lymph Nodes with Micrometastases: 1 Lymph Nodes with Isolated Tumor Cells: 0 Margins: Free of tumor. Invasive carcinoma, distance from closest margin: 1.6 cm from anterior margin. DCIS, distance from closest margin: 1.6 cm from anterior margin. Extent of Tumor: Skin: Free of tumor. Nipple: Free of tumor. Skeletal Muscle: N/A Breast Prognostic Profile (pre-neoadjuvant case #: [id number]) Estrogen Receptor: 90% and 100%, positive, strong staining. Progesterone Receptor: 90% and 100%, positive, strong staining. Her2: Negative, 1.09 and 1.52 Ki-67: 70% and  15%. Will be repeated on the current case (Block #: [id number] ) and the results reported separately. Residual Cancer Burden (RCB): Primary Tumor Bed: 21 mm x 14 mm Overall Cancer Cellularity: 90% Percentage of Cancer that is in Situ: 5% Number of Positive Lymph Nodes: 3 Diameter of Largest Lymph Node metastasis: 8 mm Residual Cancer Burden : 3.852 Residual Cancer Burden Class: RCB-III Pathologic Stage Classification (p TNM, AJCC 8th Edition): Primary Tumor (ypT): ypT2 Regional Lymph Nodes (ypN): ypN1a (JDP:gt, 04/08/17)   Diagnosis 09/12/2016 1. Lymph node, needle/core biopsy, right axilla - METASTATIC CARCINOMA WITH EXTRACAPSULAR EXTENSION. 2. Breast, right, needle core biopsy, 8 o'clock - INVASIVE DUCTAL CARCINOMA, GRADE 3.   RADIOGRAPHIC STUDIES: I have personally reviewed the radiological images as listed and agreed with the findings in the report. No results found.   ASSESSMENT & PLAN: 52 y.o. premenopausal Caucasian female, presented with  a palpable right breast mass.  1. 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 Genetics 3. Anxiety, insomnia 4. Neuropathy, G2 5. Hot flash, night sweats 6. Bilateral hip, leg bone pain   April Orozco appears stable today. She has developed bilateral hip pain occasionally causing difficult ambulation that began in 09/2017, no provoking injury. She has heavy NSAID ingestion; I suggest she limit NSAIDs and try tylenol, heat, and other nonpharmacologic interventions. I suggest she hold Tamoxifen for 1 week to see if pain improves. If so, can consider changing to other adjuvant endocrine therapy. Will call patient in 1 week to check on her pain level. If not much improvement can consider imaging. She has some anxiety over this pain, she ran out of [place] and is not sleeping well. I refilled xanax and ambien today. LE is stable; I will send message to our  physical therapist to see if there are other options for her lymphedema as she has exhausted physical therapy, compression sleeve, and a compression machine. G2 neuropathy is stable, continue 300 mg qhs gabapentin. Otherwise she is clinically doing well, labs reviewed, physical exam unremarkable today; no clinical concern for recurrence. She had bilateral mastectomies, no need for mammogram. Will continue close follow up with lab and physical exam q4 months, she will see survivorship NP in 4 months and Dr. Burr Medico in 8 months.   PLAN -Hold Tamoxifen for 1 week to see if bone pain improves; call patient in 1 week -Reduce NSAID use, try tylenol, heat, other non-pharm options -Discuss LE with physical therapist  -Refilled xanax, ambien  -Survivorship NP f/u in 4 months -Return for lab and f/u with Dr. Burr Medico in 8 months   All questions were answered. The patient knows to call the clinic with any problems, questions or concerns. No barriers to learning was detected.    Alla Feeling, NP 12/23/17

## 2017-12-24 ENCOUNTER — Telehealth: Payer: Self-pay | Admitting: Hematology

## 2017-12-24 NOTE — Telephone Encounter (Signed)
Spoke with patient regarding upcoming appointments per 1/29 los

## 2018-01-02 ENCOUNTER — Telehealth: Payer: Self-pay | Admitting: Nurse Practitioner

## 2018-01-02 NOTE — Telephone Encounter (Signed)
I attempted to reach patient to check on status of her pain since holding tamoxifen. No answer. Will re-attempt to reach her at a later date.

## 2018-01-21 ENCOUNTER — Telehealth: Payer: Self-pay | Admitting: Nurse Practitioner

## 2018-01-21 ENCOUNTER — Other Ambulatory Visit: Payer: Self-pay | Admitting: Nurse Practitioner

## 2018-01-21 DIAGNOSIS — M898X9 Other specified disorders of bone, unspecified site: Secondary | ICD-10-CM

## 2018-01-21 NOTE — Telephone Encounter (Signed)
I previously spoke to the patient on 2/12 to follow up on her bone pain of lower body, extending from groin to knees bilaterally. She remained off tamoxifen at that time. Pain had not improved since office visit in January. I discussed with Dr. Burr Medico who recommends bone scan to evaluate bone pain and f/u in office few days after. I called the patient today to discuss the plan. She is concerned now because her legs "shake" with tremor with prolonged standing. She restarted tamoxifen a couple days ago because the pain had not changed. I scheduled bone scan to be done within 1 week, will f/u in office few days after to discuss results and recommendations. The patient verbalizes understanding and thanks for the call.

## 2018-01-28 ENCOUNTER — Telehealth: Payer: Self-pay | Admitting: Hematology

## 2018-01-28 NOTE — Telephone Encounter (Signed)
Scheduled appt per 3/5 sch msg - left voicemail with appts.

## 2018-02-04 NOTE — Progress Notes (Signed)
[place]  [phone] [phone]  Clinic Follow up Note   Patient Care Team: Patient, No Pcp Per as PCP - General (General Practice) Rolm Bookbinder, MD as Consulting Physician (General Surgery) Truitt Merle, MD as Consulting Physician (Hematology) Kyung Rudd, MD as Consulting Physician (Radiation Oncology) 02/06/2018  CHIEF COMPLAINTS:  Follow up right breast cancer  Oncology History   Cancer Staging Malignant neoplasm of lower-outer quadrant of right female breast [facility]) Staging form: Breast, AJCC 7th Edition - Clinical stage from 09/12/2016: Stage IIB (T2, N1, M0) - Signed by Truitt Merle, MD on 09/18/2016 - Pathologic stage from 04/07/2017: Stage IIB (T2, N1a, cM0) - Signed by Truitt Merle, MD on 05/10/2017       Malignant neoplasm of lower-outer quadrant of right female breast ([place])   09/10/2016 Mammogram    Mammogram and ultrasound showed a 2.7 cm mass at 8 clock position of the right breast, there is also a lobulated lymph nodes in the right axilla, slightly suspicious for malignancy.       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 Docetaxel 53m/m2 on 01/24/17, was to be given every 3 weeks but stopped due to worsening neuropathy (only 1 cycle given)      11/20/2016 Genetic Testing    Negative genetic testing on the  Breast/Ovarian cancer panel.  Negative genetic testing for the MSH2 inversion analysis ([place] inversion). The Breast/Ovarian gene panel offered by [facility] includes sequencing and rearrangement analysis for the following 20 genes:  ATM, BARD1, BRCA1, BRCA2, BRIP1, CDH1, CHEK2, EPCAM, FANCC, MLH1, MSH2, MSH6, NBN, PALB2, PMS2, PTEN, RAD51C, RAD51D, TP53, and XRCC2.   [facility] and [facility].      02/14/2017 -  Anti-estrogen oral therapy    Tamoxifen 20 mg daily starting 02/14/17, held since surgery on 04/07/2017, restart 05/22/17      02/22/2017 Breast MRI    IMPRESSION: 1. Significantly smaller mass within the lower outer quadrant of the right breast, now measuring 1.5 cm (previously 2.7 cm). 2. Smaller right axillary lymph nodes.      04/07/2017 Surgery    RIGHT SKIN SPARING MASTECTOMY WITH RADIOACTIVE SEED GUIDED AXILLARY LYMPH NODE EXCISION AND AXILLARY SENTINEL LYMPH NODE BIOPSY, LEFT PROPHYLACTIC SKIN SPARING MASTECTOMY by Dr. WDonne Hazel      04/07/2017 Pathology Results    Diagnosis 04/07/17 1. Breast, simple mastectomy, Left Prophylactic Skin Sparing - FIBROCYSTIC CHANGES WITH SCLEROSING ADENOSIS AND CALCIFICATIONS. - LOBULAR NEOPLASIA (ATYPICAL LOBULAR HYPERPLASIA). 2. Breast, simple mastectomy, Right Skin Sparing - INVASIVE AND IN SITU DUCTAL CARCINOMA, 2.1 CM. - MARGINS NOT INVOLVED. - FIBROCYSTIC CHANGES WITH CALCIFICATIONS AND FOCAL ATYPICAL DUCTAL HYPERPLASIA. - METASTATIC CARCINOMA IN ONE LYMPH NODE WITH ASSOCIATED BIOPSY CAVITY (1/1). 3. Lymph nodes, regional resection, Right Axillary - METASTATIC  CARCINOMA IN TWO OF SIX LYMPH NODES (2/6).      04/23/2017 Surgery    RIGHT AXILLARY LYMPH NODE DISSECTION by Dr. Donne Hazel       04/23/2017 Pathology Results    Diagnosis 04/23/17 Lymph nodes, regional resection, Right Axillary Contents - NO CARCINOMA IDENTIFIED IN EIGHT LYMPH NODES (0/8)        06/03/2017  Pathology Results    Skin right breast mastectomy and wound with results of: Skin with ulceration, acute suppurative inflammation and foci of suture material with giant cell reaction. No evidence of malignancy.       08/11/2017 - 10/07/2017 Radiation Therapy    Adjuvant breast and axilla radiation       02/05/2018 Imaging    Whole Body Bone Scan 02/05/18 IMPRESSION: 1. Focal increased radiotracer uptake in the right greater trochanter and left superior acetabular regions. Advise pelvic radiographic examination to further evaluate these areas for possible metastatic foci. It is possible that these areas show abnormal uptake due to asymmetric areas of arthropathy. 2. Probable arthropathy in the shoulders, elbows, and knees. Increased uptake of radiotracer in the mid face is likely due to paranasal sinus disease. 3. Increased radiotracer uptake throughout much of the left breast may be a nonspecific finding. This finding also may be seen due to breast carcinoma. Appropriate assessment of the left breast in this regard advised.        HISTORY OF PRESENTING ILLNESS:  April Orozco 52 y.o. female is here because of her recently diagnosed right breast cancer. She is accompanied by her boyfriend to our multidisciplinary breast clinic today.   She noticed a right breast lump 3 monthsa ago, no pain or tenderness, no skin change or nipple discharge. She feels the lump has been getting bigger over the past 3 months. She called her gynecologist and was finally seen a few weeks ago. She was referred to have a diagnostic mammogram and ultrasound which showed a 2.7 cm mass at the 8:00 position of the right breast, and also a lobulated lymph nodes in the right axilla. 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  She feels well, denies any skin pain, or other symptoms. No recent  weight loss. 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 Her husband died from small cell cancer several years ago, she has 5 daughters. She recently found out that 3 of her paternal cousins had breast cancer.  GYN HISTORY  Menarchal: 17 LMP: regular  Contraceptive: no  HRT: n/a  G5P5: 5 daughters 30-19  CURRENT THERAPY: Tamoxifen 20 mg daily started 02/14/17, held since 12/24/2017, restart 02/06/2018   INTERVAL HISTORY:  April Orozco returns for follow-up. She presents to the clinic today by herself. She reports to having intermittent pain that starts in her groin area that radiates to her knee. L > R leg. She notes this pain was onset for 2 months. She sometimes has hard time standing so she has had to cut back to part time at work. She is using 600 mg ibuprofen daily. She also reports her legs shake intermittently and some mild back pain. She reports Tamoxifen was held since 12/23/17 and was restarted for a few days in late February. She notes her pain is unchanged since stopping discontinuing.    On review of systems, pt denies any other complaints at this  time. Pertinent positives are listed and detailed within the above HPI.   MEDICAL HISTORY:  Past Medical History:  Diagnosis Date  . Fracture, sacrum/coccyx ([place]) 1970   "Shattered tailbone"  . Heart murmur    [facility] has evaluated, felt benign  . Malignant neoplasm of lower-outer quadrant of right female breast ([place]) 09/13/2016  . Tachycardia    Intermittent, resolved with decreased caffeine intake    SURGICAL HISTORY: Past Surgical History:  Procedure Laterality Date  . APPENDECTOMY    . AXILLARY LYMPH NODE DISSECTION Right 04/23/2017   Procedure: RIGHT AXILLARY LYMPH NODE DISSECTION;  Surgeon: Rolm Bookbinder, MD;  Location: [facility];  Service: General;  Laterality: Right;  . BREAST LUMPECTOMY WITH AXILLARY LYMPH NODE BIOPSY Right 04/23/2017  . BREAST  RECONSTRUCTION WITH PLACEMENT OF TISSUE EXPANDER AND FLEX HD (ACELLULAR HYDRATED DERMIS) Bilateral 04/07/2017   Procedure: BILATERAL BREAST RECONSTRUCTION WITH PLACEMENT OF TISSUE EXPANDER AND FLEX HD (ACELLULAR HYDRATED DERMIS);  Surgeon: Crissie Reese, MD;  Location: [facility];  Service: Plastics;  Laterality: Bilateral;  . IR GENERIC HISTORICAL  01/10/2017   IR CV LINE INJECTION 01/10/2017 Greggory Keen, MD [facility]-INTERV RAD  . MASTECTOMY    . MASTECTOMY WITH RADIOACTIVE SEED GUIDED EXCISION AND AXILLARY SENTINEL LYMPH NODE BIOPSY Bilateral 04/07/2017   Procedure: RIGHT SKIN SPARING MASTECTOMY WITH RADIOACTIVE SEED GUIDED AXILLARY LYMPH NODE EXCISION AND AXILLARY SENTINEL LYMPH NODE BIOPSY, LEFT PROPHYLACTIC SKIN SPARING MASTECTOMY;  Surgeon: Rolm Bookbinder, MD;  Location: [place];  Service: General;  Laterality: Bilateral;  . PORT-A-CATH REMOVAL N/A 04/07/2017   Procedure: REMOVAL PORT-A-CATH;  Surgeon: Rolm Bookbinder, MD;  Location: [place];  Service: General;  Laterality: N/A;  . PORTACATH PLACEMENT Right 09/23/2016   Procedure: INSERTION PORT-A-CATH WITH [place];  Surgeon: Rolm Bookbinder, MD;  Location: [place];  Service: General;  Laterality: Right;  . TONSILECTOMY/ADENOIDECTOMY WITH MYRINGOTOMY    . TUBAL LIGATION      SOCIAL HISTORY: Social History   Socioeconomic History  . Marital status: Widowed    Spouse name: Not on file  . Number of children: 5  . Years of education: Not on file  . Highest education level: Not on file  Social Needs  . Financial resource strain: Not on file  . Food insecurity - worry: Not on file  . Food insecurity - inability: Not on file  . Transportation needs - medical: Not on file  . Transportation needs - non-medical: Not on file  Occupational History  . Occupation: Programmer, multimedia: [employer]    Comment: OR trauma nurse  Tobacco Use  . Smoking status: Never Smoker  . Smokeless tobacco: Never Used  Substance and Sexual Activity  .  Alcohol use: Yes    Comment: social drinker   . Drug use: No  . Sexual activity: Not on file  Other Topics Concern  . Not on file  Social History Narrative  . Not on file    FAMILY HISTORY: Family History  Problem Relation Age of Onset  . Diabetes Mother   . Diabetes Father   . Skin cancer Father 32       NOS type; worked as a Theme park manager for many years  . Hypertension Other   . Breast cancer Cousin 68       paternal 1st cousin; w/ mets to bone  . Breast cancer Cousin        paternal 1st cousin dx 24-50; s/p lump  . Breast cancer Cousin  paternal 1st cousin dx 30-53 w/ mets to LN; s/p BL mastectomies  . Heart disease Maternal Aunt 58  . Colon cancer Paternal Uncle        dx 67-68  . Goiter Maternal Grandmother        d. bled to death following goiter surgery  . Heart attack Maternal Grandfather        d. early 79s  . Lung cancer Paternal Grandmother        dx 71s; d. 88y  . Diabetes Paternal Grandmother   . Heart failure Paternal Grandfather        d. 75-76  . Alzheimer's disease Paternal Grandfather   . Other Daughter 94       daughter w/ hx benign breast lump/cyst removed  . Epilepsy Maternal Aunt   . Alzheimer's disease Paternal Aunt   . Brain cancer Cousin 14       paternal 1st cousin; d. 34y; NOS type    ALLERGIES:  is allergic to banana; sulfur; and tramadol.  MEDICATIONS:  Current Outpatient Medications  Medication Sig Dispense Refill  . acetaminophen-codeine (TYLENOL #3) 300-30 MG tablet Take 1 tablet by mouth every 4 (four) hours as needed for moderate pain. 30 tablet 0  . ALPRAZolam (XANAX) 0.5 MG tablet Take 1 tablet (0.5 mg total) by mouth at bedtime as needed for anxiety. 20 tablet 0  . gabapentin (NEURONTIN) 300 MG capsule Take 1 capsule (300 mg total) by mouth at bedtime. 90 capsule 1  . ibuprofen (ADVIL,MOTRIN) 200 MG tablet Take 800 mg by mouth every 6 (six) hours as needed for headache, mild pain or moderate pain.    . tamoxifen (NOLVADEX) 20  MG tablet Take 1 tablet (20 mg total) by mouth daily. 30 tablet 3  . zolpidem (AMBIEN) 10 MG tablet Take 1 tablet (10 mg total) by mouth at bedtime as needed for sleep. 30 tablet 1   No current facility-administered medications for this visit.     REVIEW OF SYSTEMS:  Constitutional: Denies fevers, chills (+) night sweats, fatigue, hot flashes Eyes: Denies blurriness of vision, double vision or watery eyes Ears, nose, mouth, throat, and face: Denies mucositis or sore throat Respiratory: Denies dyspnea or wheezes Cardiovascular: Denies palpitation, chest discomfort or lower extremity swelling Gastrointestinal:  Denies nausea, heartburn,  Skin: Denies abnormal skin rashes Lymphatics: Denies new lymphadenopathy or easy bruising (+) swelling in hands and feet (+) right arm swelling  Neurological:Denies new weaknesses (+) tingling and numbness in hands and feet (+) left leg nerve pain MSK: (+) hip pain that radiates to knee bilaterally  Behavioral/Psych: Mood is stable, no new changes (+) depression All other systems were reviewed with the patient and are negative.  PHYSICAL EXAMINATION: ECOG PERFORMANCE STATUS: 1  Vitals:   02/06/18 1017  BP: (!) 111/43  Pulse: 89  Resp: 18  Temp: 97.7 F (36.5 C)  SpO2: 100%   Filed Weights   02/06/18 1017  Weight: 176 lb 4.8 oz (80 kg)     GENERAL:alert, no distress and comfortable (+) alopecia  SKIN: skin color, texture, turgor are normal, no rashes or significant lesions EYES: normal, conjunctiva are pink and non-injected, sclera clear OROPHARYNX:no exudate, no erythema and lips, buccal mucosa, and tongue normal  NECK: supple, thyroid normal size, non-tender, without nodularity LYMPH:  no palpable lymphadenopathy in the cervical, axillary or inguinal (+) right arm swelling  LUNGS: clear to auscultation and percussion with normal breathing effort HEART: regular rate & rhythm and no murmurs and  no lower extremity edema ABDOMEN:abdomen soft,  non-tender and normal bowel sounds Musculoskeletal:no cyanosis of digits and no clubbing  PSYCH: alert & oriented x 3 with fluent speech NEURO: (+) Moderately decreased vibration sensation in the extremities. Most prominent on the right upper extremity. Breasts: (+) Post bilateral mastectomy. Right breast: Incision has healed well. There is some soft tissue on both ends of the incision. No palpable mass or drainage.    LABORATORY DATA:  I have reviewed the data as listed CBC Latest Ref Rng & Units 02/06/2018 12/23/2017 09/22/2017  WBC 3.9 - 10.3 K/uL 6.4 6.3 7.4  Hemoglobin 11.6 - 15.9 g/dL 13.4 14.1 12.9  Hematocrit 34.8 - 46.6 % 40.4 42.8 38.9  Platelets 145 - 400 K/uL 238 243 218   CMP Latest Ref Rng & Units 02/06/2018 12/23/2017 09/22/2017  Glucose 70 - 140 mg/dL 139 144(H) 108  BUN 7 - 26 mg/dL 14 14 9.2  Creatinine 0.60 - 1.10 mg/dL 0.77 0.79 0.7  Sodium 136 - 145 mmol/L 139 138 137  Potassium 3.5 - 5.1 mmol/L 4.0 3.8 3.9  Chloride 98 - 109 mmol/L 101 100 -  CO2 22 - 29 mmol/L _0 Calcium 8.4 - 10.4 mg/dL 10.1 9.7 9.6  Total Protein 6.4 - 8.3 g/dL 8.2 8.2 7.9  Total Bilirubin 0.2 - 1.2 mg/dL 0.2 <0.2(L) <0.22  Alkaline Phos 40 - 150 U/L 98 100 106  AST 5 - 34 U/L _1 ALT 0 - 55 U/L _2 PATHOLOGY REPORTS:  Diagnosis 04/23/17 Lymph nodes, regional resection, Right Axillary Contents - NO CARCINOMA IDENTIFIED IN EIGHT LYMPH NODES (0/8)   Diagnosis 04/07/17 1. Breast, simple mastectomy, Left Prophylactic Skin Sparing - FIBROCYSTIC CHANGES WITH SCLEROSING ADENOSIS AND CALCIFICATIONS. - LOBULAR NEOPLASIA (ATYPICAL LOBULAR HYPERPLASIA). 2. Breast, simple mastectomy, Right Skin Sparing - INVASIVE AND IN SITU DUCTAL CARCINOMA, 2.1 CM. - MARGINS NOT INVOLVED. - FIBROCYSTIC CHANGES WITH CALCIFICATIONS AND FOCAL ATYPICAL DUCTAL HYPERPLASIA. - METASTATIC CARCINOMA IN ONE LYMPH NODE WITH ASSOCIATED BIOPSY CAVITY (1/1). 3. Lymph nodes, regional resection, Right  Axillary - METASTATIC CARCINOMA IN TWO OF SIX LYMPH NODES (2/6). Microscopic Comment 2. BREAST, STATUS POST NEOADJUVANT TREATMENT Procedure: Skin sparring mastectomy and axillary lymph node excision. Laterality: Right breast. Tumor Size: 2.1 cm. Histologic Type: Ductal. Grade: III Tubular Differentiation: 3 Nuclear Pleomorphism: 3 Mitotic Count: 2 Ductal Carcinoma in Situ (DCIS): Present, high grade with necrosis. Regional Lymph Nodes: Number of Lymph Nodes Examined: 7 Number of Sentinel Lymph Nodes Examined: 0 Lymph Nodes with Macrometastases: 2 Lymph Nodes with Micrometastases: 1 Lymph Nodes with Isolated Tumor Cells: 0 Margins: Free of tumor. Invasive carcinoma, distance from closest margin: 1.6 cm from anterior margin. DCIS, distance from closest margin: 1.6 cm from anterior margin. Extent of Tumor: Skin: Free of tumor. Nipple: Free of tumor. Skeletal Muscle: N/A Breast Prognostic Profile (pre-neoadjuvant case #: [id number]) Estrogen Receptor: 90% and 100%, positive, strong staining. Progesterone Receptor: 90% and 100%, positive, strong staining. Her2: Negative, 1.09 and 1.52 Ki-67: 70% and 15%. Will be repeated on the current case (Block #: [id number] ) and the results reported separately. Residual Cancer Burden (RCB): Primary Tumor Bed: 21 mm x 14 mm Overall Cancer Cellularity: 90% Percentage of Cancer that is in Situ: 5% Number of Positive Lymph Nodes: 3 Diameter of Largest Lymph Node metastasis: 8 mm Residual Cancer Burden : 3.852 Residual Cancer Burden Class: RCB-III Pathologic Stage Classification (p TNM, AJCC 8th Edition): Primary Tumor (ypT): ypT2  Regional Lymph Nodes (ypN): ypN1a (JDP:gt, 04/08/17)   Diagnosis 09/12/2016 1. Lymph node, needle/core biopsy, right axilla - METASTATIC CARCINOMA WITH EXTRACAPSULAR EXTENSION. 2. Breast, right, needle core biopsy, 8 o'clock - INVASIVE DUCTAL CARCINOMA, GRADE 3.   RADIOGRAPHIC STUDIES: I have personally reviewed the  radiological images as listed and agreed with the findings in the report. Nm Bone Scan Whole Body  Result Date: 02/05/2018 CLINICAL DATA:  Breast carcinoma EXAM: NUCLEAR MEDICINE WHOLE BODY BONE SCAN TECHNIQUE: Whole body anterior and posterior images were obtained approximately 3 hours after intravenous injection of radiopharmaceutical. RADIOPHARMACEUTICALS:  20.1 mCi Technetium-43mMDP IV COMPARISON:  None. FINDINGS: There is focal increased radiotracer uptake in the greater trochanter of the proximal right femur as well as in the superior left acetabulum. Slight increased uptake in both shoulders, elbows, and wrists likely is of arthropathic etiology. Increased uptake in the mid face is likely due to paranasal sinus disease. Kidneys are noted in the flank positions bilaterally. There is increased radiotracer uptake throughout much of the left breast. IMPRESSION: 1. Focal increased radiotracer uptake in the right greater trochanter and left superior acetabular regions. Advise pelvic radiographic examination to further evaluate these areas for possible metastatic foci. It is possible that these areas show abnormal uptake due to asymmetric areas of arthropathy. 2. Probable arthropathy in the shoulders, elbows, and knees. Increased uptake of radiotracer in the mid face is likely due to paranasal sinus disease. 3. Increased radiotracer uptake throughout much of the left breast may be a nonspecific finding. This finding also may be seen due to breast carcinoma. Appropriate assessment of the left breast in this regard advised. Electronically Signed   By: WLowella GripIII M.D.   On: 02/05/2018 16:04    .  ASSESSMENT & PLAN:  52y.o. premenopausal Caucasian female, presented with a palpable right breast mass.  1. 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 -She completed 4 cycles of Adriamycin and Cytoxan, proceeded with Taxol (received 3 doses) and one dose docetaxel, developed moderate peripheral neuropathy, chemotherapy was stopped due to neuropathy. -The patient started Tamoxifen the week of 02/14/17 while she was awaiting surgery. She had it held during her breast surgery  -Pt had right mastectomy 04/07/17 and right axillary lymph node dissection 04/23/17 -I previously reviewed her surgical pathology findings, which showed 3 positive lymph nodes. She did not have a good response to neoadjuvant chemotherapy. She underwent right axilla lymph node dissection. -I did not recommend adjuvant chemo due to her poor tolerance.  -Restarted Tamoxifen 05/2017 after she recovered from surgery. -Pt underwent adjuvant RT with Dr. MLisbeth Renshawon 08/11/17 and completed on 10/07/17. She had multiple treatment delays.    -we discussed cancer surveillance after completion of radiation with lab and physical exam; she had bilateral mastectomy and will not need mammogram -Held Tamoxifen in early Feb 2019 to see if bone pain improved but it did not. I ordered Whole Body Bone scan to rule out bone metastasis. Bone Scan from 02/05/18 revealed focal increased radiotracer uptake in the right greater trochanter and left superior acetabular regions.  I reviewed the image with patient in person.  This is suspicious for bone metastasis but could also be indicative of moderate-severe arthritis. I recommend a pelvic MRI for further evaluation.  If MI is suspicious for bone metastasis, then I also get CT chest, abdomen and  pelvis with contrast to rule out other distant metastasis, and determine the site of biopsy. -She can restart Tamoxifen as her pain is likely not related to Tamoxifen  -labs reviewed, CBC is WNL and CMP is pending today  -f/u in 3 weeks    2. Genetics  -Her young age and strong family history of breast cancer, we'll refer her to his  genetics for testing, to rule out inheritable genetic syndrome. -Negative genetic testing on the Breast/Ovarian cancer panel.  Negative genetic testing for the MSH2 inversion analysis ([place] inversion). The Breast/Ovarian gene panel offered by [facility] includes sequencing and rearrangement analysis for the following 20 genes:  ATM, BARD1, BRCA1, BRCA2, BRIP1, CDH1, CHEK2, EPCAM, FANCC, MLH1, MSH2, MSH6, NBN, PALB2, PMS2, PTEN, RAD51C, RAD51D, TP53, and XRCC2.   The report date is December 19 for the [facility] panel and November 20, 2016 for the [place] inversion.  3. Anxiety  and insomnia -She has been quite anxious since cancer diagnosis -continue lorazepam 0.5 mg as needed for insomnia and anxiety -Lacie refilled xanax and ambien on 12/23/17  4. Neuropathy, G2 -Chemotherapy induced. -Numbness and tingling of her fingers and toes. -Previously prescribed Neurontin, I recommended her to increase from 100 mg to 232m at night, and gradually titrate the dose to 3010mtid if needed  -Currently on 300 mg Neurontin at bedtime, I advised her she can take 400 mg, refilled tday  5. Hot flash and night sweats -She has tried Effexor for a few months, and not feel helpful, she has stop it.  6. Bilateral hip, leg bone pain -She has developed bilateral hip pain occasionally causing difficult ambulation that began in 09/2017, no provoking injury. She has heavy NSAID ingestion; it was suggested she limit NSAIDs and try tylenol, heat, and other nonpharmacologic interventions. -Lacie advised her to hold Tamoxifen for 1 week to see if pain improves. If so, can consider changing to other adjuvant endocrine therapy. -Lacie called pt on 2/12 to f/u, she remained off Tamoxifen at that time with continued pain. I recommend a Whole Body Bone Scan to be done.  -Bone Scan from 02/05/18 revealed focal increased radiotracer uptake in the right greater trochanter and left superior acetabular regions. -She still has significant pain  that is not managed that well. She is allergic to Tramadol, I will prescribed Tylenol #3 today  7. Cancer Screening -I encouraged her to start seeing a PCP. She is [age]ears old and I encouraged her to have a colonoscopy this year and complete routine pap screening.   Plan  -Pelvic MRI with and without contrast next week, I will discuss results on the phone with her -restart Tamoxifen  -prescribed Tylenol #3 for her hip pain today and refilled Neurontin and Ambien    All questions were answered. The patient knows to call the clinic with any problems, questions or concerns.  I spent 25 minutes counseling the patient face to face. The total time spent in the appointment was 30 minutes and more than 50% was on counseling.  This document serves as a record of services personally performed by YaTruitt MerleMD. It was created on her behalf by DaTheresia Bougha trained medical scribe. The creation of this record is based on the scribe's personal observations and the provider's statements to them.   I have reviewed the above documentation for accuracy and completeness, and I agree with the above.     YaTruitt MerleMD 02/06/2018 6:10 PM

## 2018-02-05 ENCOUNTER — Encounter (HOSPITAL_COMMUNITY)
Admission: RE | Admit: 2018-02-05 | Discharge: 2018-02-05 | Disposition: A | Discharge status: Home / Self Care | Payer: 59 | Source: Ambulatory Visit | Attending: Nurse Practitioner | Admitting: Nurse Practitioner

## 2018-02-05 ENCOUNTER — Other Ambulatory Visit: Payer: Self-pay | Admitting: Hematology

## 2018-02-05 DIAGNOSIS — R937 Abnormal findings on diagnostic imaging of other parts of musculoskeletal system: Secondary | ICD-10-CM | POA: Insufficient documentation

## 2018-02-05 DIAGNOSIS — M898X9 Other specified disorders of bone, unspecified site: Secondary | ICD-10-CM | POA: Insufficient documentation

## 2018-02-05 DIAGNOSIS — C50511 Malignant neoplasm of lower-outer quadrant of right female breast: Secondary | ICD-10-CM

## 2018-02-05 DIAGNOSIS — Z170 Estrogen receptor positive status [ER+]: Principal | ICD-10-CM

## 2018-02-05 MED ORDER — TECHNETIUM TC 99M MEDRONATE IV KIT
20.1000 | PACK | Freq: Once | INTRAVENOUS | Status: AC | PRN
  Administered 2018-02-05: 20.1 via INTRAVENOUS

## 2018-02-06 ENCOUNTER — Encounter: Payer: Self-pay | Admitting: Hematology

## 2018-02-06 ENCOUNTER — Inpatient Hospital Stay: Payer: 59

## 2018-02-06 ENCOUNTER — Inpatient Hospital Stay: Payer: 59 | Attending: Nurse Practitioner | Admitting: Hematology

## 2018-02-06 VITALS — BP 111/43 | HR 89 | Temp 97.7°F | Resp 18 | Wt 176.3 lb

## 2018-02-06 DIAGNOSIS — R61 Generalized hyperhidrosis: Secondary | ICD-10-CM | POA: Diagnosis not present

## 2018-02-06 DIAGNOSIS — Z803 Family history of malignant neoplasm of breast: Secondary | ICD-10-CM | POA: Diagnosis not present

## 2018-02-06 DIAGNOSIS — G620 Drug-induced polyneuropathy: Secondary | ICD-10-CM

## 2018-02-06 DIAGNOSIS — Z79899 Other long term (current) drug therapy: Secondary | ICD-10-CM | POA: Insufficient documentation

## 2018-02-06 DIAGNOSIS — R200 Anesthesia of skin: Secondary | ICD-10-CM | POA: Insufficient documentation

## 2018-02-06 DIAGNOSIS — M25552 Pain in left hip: Secondary | ICD-10-CM | POA: Diagnosis not present

## 2018-02-06 DIAGNOSIS — C50511 Malignant neoplasm of lower-outer quadrant of right female breast: Secondary | ICD-10-CM

## 2018-02-06 DIAGNOSIS — Z791 Long term (current) use of non-steroidal anti-inflammatories (NSAID): Secondary | ICD-10-CM | POA: Insufficient documentation

## 2018-02-06 DIAGNOSIS — Z888 Allergy status to other drugs, medicaments and biological substances status: Secondary | ICD-10-CM | POA: Diagnosis not present

## 2018-02-06 DIAGNOSIS — Z170 Estrogen receptor positive status [ER+]: Principal | ICD-10-CM

## 2018-02-06 DIAGNOSIS — M25551 Pain in right hip: Secondary | ICD-10-CM

## 2018-02-06 DIAGNOSIS — G4700 Insomnia, unspecified: Secondary | ICD-10-CM | POA: Diagnosis not present

## 2018-02-06 DIAGNOSIS — G629 Polyneuropathy, unspecified: Secondary | ICD-10-CM | POA: Diagnosis not present

## 2018-02-06 DIAGNOSIS — F419 Anxiety disorder, unspecified: Secondary | ICD-10-CM | POA: Diagnosis not present

## 2018-02-06 DIAGNOSIS — N951 Menopausal and female climacteric states: Secondary | ICD-10-CM | POA: Diagnosis not present

## 2018-02-06 DIAGNOSIS — R202 Paresthesia of skin: Secondary | ICD-10-CM | POA: Diagnosis not present

## 2018-02-06 DIAGNOSIS — Z9011 Acquired absence of right breast and nipple: Secondary | ICD-10-CM | POA: Insufficient documentation

## 2018-02-06 LAB — CBC WITH DIFFERENTIAL/PLATELET
Basophils Absolute: 0 10*3/uL (ref 0.0–0.1)
Basophils Relative: 0 %
EOS ABS: 0.1 10*3/uL (ref 0.0–0.5)
EOS PCT: 1 %
HCT: 40.4 % (ref 34.8–46.6)
Hemoglobin: 13.4 g/dL (ref 11.6–15.9)
LYMPHS ABS: 1.1 10*3/uL (ref 0.9–3.3)
LYMPHS PCT: 17 %
MCH: 30.1 pg (ref 25.1–34.0)
MCHC: 33.2 g/dL (ref 31.5–36.0)
MCV: 90.8 fL (ref 79.5–101.0)
MONOS PCT: 6 %
Monocytes Absolute: 0.4 10*3/uL (ref 0.1–0.9)
Neutro Abs: 4.9 10*3/uL (ref 1.5–6.5)
Neutrophils Relative %: 76 %
PLATELETS: 238 10*3/uL (ref 145–400)
RBC: 4.45 MIL/uL (ref 3.70–5.45)
RDW: 13.7 % (ref 11.2–14.5)
WBC: 6.4 10*3/uL (ref 3.9–10.3)

## 2018-02-06 LAB — COMPREHENSIVE METABOLIC PANEL
ALBUMIN: 4 g/dL (ref 3.5–5.0)
ALT: 16 U/L (ref 0–55)
AST: 17 U/L (ref 5–34)
Alkaline Phosphatase: 98 U/L (ref 40–150)
Anion gap: 10 (ref 3–11)
BUN: 14 mg/dL (ref 7–26)
CHLORIDE: 101 mmol/L (ref 98–109)
CO2: 28 mmol/L (ref 22–29)
CREATININE: 0.77 mg/dL (ref 0.60–1.10)
Calcium: 10.1 mg/dL (ref 8.4–10.4)
GFR calc Af Amer: >60 mL/min (ref >60)
GLUCOSE: 139 mg/dL (ref 70–140)
Potassium: 4 mmol/L (ref 3.5–5.1)
Sodium: 139 mmol/L (ref 136–145)
Total Bilirubin: 0.2 mg/dL (ref 0.2–1.2)
Total Protein: 8.2 g/dL (ref 6.4–8.3)

## 2018-02-06 MED ORDER — ZOLPIDEM TARTRATE 10 MG PO TABS: 10.0000 mg | ORAL_TABLET | Freq: Every evening | ORAL | 1 refills | Status: DC | PRN

## 2018-02-06 MED ORDER — GABAPENTIN 300 MG PO CAPS: 300.0000 mg | ORAL_CAPSULE | Freq: Every day | ORAL | 1 refills | Status: DC

## 2018-02-06 MED ORDER — ACETAMINOPHEN-CODEINE #3 300-30 MG PO TABS: 1.0000 | ORAL_TABLET | ORAL | 0 refills | Status: DC | PRN

## 2018-02-07 LAB — CANCER ANTIGEN 27.29: CAN 27.29: 16.3 U/mL (ref 0.0–38.6)

## 2018-02-12 ENCOUNTER — Ambulatory Visit (HOSPITAL_COMMUNITY)
Admission: RE | Admit: 2018-02-12 | Discharge: 2018-02-12 | Disposition: A | Discharge status: Home / Self Care | Payer: 59 | Source: Ambulatory Visit | Attending: Hematology | Admitting: Hematology

## 2018-02-12 DIAGNOSIS — M1612 Unilateral primary osteoarthritis, left hip: Secondary | ICD-10-CM | POA: Insufficient documentation

## 2018-02-12 DIAGNOSIS — M7061 Trochanteric bursitis, right hip: Secondary | ICD-10-CM | POA: Insufficient documentation

## 2018-02-12 DIAGNOSIS — Z170 Estrogen receptor positive status [ER+]: Secondary | ICD-10-CM | POA: Insufficient documentation

## 2018-02-12 DIAGNOSIS — C50511 Malignant neoplasm of lower-outer quadrant of right female breast: Secondary | ICD-10-CM | POA: Insufficient documentation

## 2018-02-12 MED ORDER — GADOBENATE DIMEGLUMINE 529 MG/ML IV SOLN
15.0000 mL | Freq: Once | INTRAVENOUS | Status: AC | PRN
  Administered 2018-02-12: 15 mL via INTRAVENOUS

## 2018-02-20 ENCOUNTER — Telehealth: Payer: Self-pay | Admitting: *Deleted

## 2018-02-20 NOTE — Telephone Encounter (Signed)
TCT patient regarding results of recent MRI of pelvis. No answer but was able to leave VM message regarding results. Dr. Burr Medico recommends pt to f/u with orthopedic MD or her PCP for bone pain as MRI negative for bone mets.

## 2018-02-27 ENCOUNTER — Ambulatory Visit: Payer: 59 | Admitting: Hematology

## 2018-03-02 ENCOUNTER — Telehealth: Payer: Self-pay | Admitting: Hematology

## 2018-03-02 NOTE — Telephone Encounter (Signed)
Appt scheduled Calendar / Letter mailed to patient per 4/5 sch msg

## 2018-03-10 ENCOUNTER — Ambulatory Visit: Payer: 59 | Admitting: Adult Health

## 2018-03-20 NOTE — Telephone Encounter (Signed)
Error opening  

## 2018-03-31 NOTE — Progress Notes (Signed)
April Orozco  [phone] [phone]  Clinic Follow up Note   Patient Care Team: Patient, No Pcp Per as PCP - General (General Practice) Rolm Bookbinder, MD as Consulting Physician (General Surgery) Truitt Merle, MD as Consulting Physician (Hematology) Kyung Rudd, MD as Consulting Physician (Radiation Oncology) 04/02/2018  CHIEF COMPLAINTS:  Follow up right breast cancer  Oncology History   Cancer Staging Malignant neoplasm of lower-outer quadrant of right female breast [facility]) Staging form: Breast, AJCC 7th Edition - Clinical stage from 09/12/2016: Stage IIB (T2, N1, M0) - Signed by Truitt Merle, MD on 09/18/2016 - Pathologic stage from 04/07/2017: Stage IIB (T2, N1a, cM0) - Signed by Truitt Merle, MD on 05/10/2017       Malignant neoplasm of lower-outer quadrant of right female breast ([place])   09/10/2016 Mammogram    Mammogram and ultrasound showed a 2.7 cm mass at 8 clock position of the right breast, there is also a lobulated lymph nodes in the right axilla, slightly suspicious for malignancy.       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 Docetaxel 47m/m2 on 01/24/17, was to be given every 3 weeks but stopped due to worsening neuropathy (only 1 cycle given)      11/20/2016 Genetic Testing    Negative genetic testing on the  Breast/Ovarian cancer panel.  Negative genetic testing for the MSH2 inversion analysis ([place] inversion). The Breast/Ovarian gene panel offered by [facility] includes sequencing and rearrangement analysis for the following 20 genes:  ATM, BARD1, BRCA1, BRCA2, BRIP1, CDH1, CHEK2, EPCAM, FANCC, MLH1, MSH2, MSH6, NBN, PALB2, PMS2, PTEN, RAD51C, RAD51D, TP53, and XRCC2.   [facility].      02/14/2017 -  Anti-estrogen oral therapy    Tamoxifen 20 mg daily starting 02/14/17, held since surgery on 04/07/2017, restart 05/22/17      02/22/2017 Breast MRI    IMPRESSION: 1. Significantly smaller mass within the lower outer quadrant of the right breast, now measuring 1.5 cm (previously 2.7 cm). 2. Smaller right axillary lymph nodes.      04/07/2017 Surgery    RIGHT SKIN SPARING MASTECTOMY WITH RADIOACTIVE SEED GUIDED AXILLARY LYMPH NODE EXCISION AND AXILLARY SENTINEL LYMPH NODE BIOPSY, LEFT PROPHYLACTIC SKIN SPARING MASTECTOMY by Dr. WDonne Hazel      04/07/2017 Pathology Results    Diagnosis 04/07/17 1. Breast, simple mastectomy, Left Prophylactic Skin Sparing - FIBROCYSTIC CHANGES WITH SCLEROSING ADENOSIS AND CALCIFICATIONS. - LOBULAR NEOPLASIA (ATYPICAL LOBULAR HYPERPLASIA). 2. Breast, simple mastectomy, Right Skin Sparing - INVASIVE AND IN SITU DUCTAL CARCINOMA, 2.1 CM. - MARGINS NOT INVOLVED. - FIBROCYSTIC CHANGES WITH CALCIFICATIONS AND FOCAL ATYPICAL DUCTAL HYPERPLASIA. - METASTATIC CARCINOMA IN ONE LYMPH NODE WITH ASSOCIATED BIOPSY CAVITY (1/1). 3. Lymph nodes, regional resection, Right Axillary - METASTATIC  CARCINOMA IN TWO OF SIX LYMPH NODES (2/6).      04/23/2017 Surgery    RIGHT AXILLARY LYMPH NODE DISSECTION by Dr. Donne Hazel       04/23/2017 Pathology Results    Diagnosis 04/23/17 Lymph nodes, regional resection, Right Axillary Contents - NO CARCINOMA IDENTIFIED IN EIGHT LYMPH NODES (0/8)        06/03/2017  Pathology Results    Skin right breast mastectomy and wound with results of: Skin with ulceration, acute suppurative inflammation and foci of suture material with giant cell reaction. No evidence of malignancy.       08/11/2017 - 10/07/2017 Radiation Therapy    Adjuvant breast and axilla radiation       02/05/2018 Imaging    Whole Body Bone Scan 02/05/18 IMPRESSION: 1. Focal increased radiotracer uptake in the right greater trochanter and left superior acetabular regions. Advise pelvic radiographic examination to further evaluate these areas for possible metastatic foci. It is possible that these areas show abnormal uptake due to asymmetric areas of arthropathy. 2. Probable arthropathy in the shoulders, elbows, and knees. Increased uptake of radiotracer in the mid face is likely due to paranasal sinus disease. 3. Increased radiotracer uptake throughout much of the left breast may be a nonspecific finding. This finding also may be seen due to breast carcinoma. Appropriate assessment of the left breast in this regard advised.      02/12/2018 Imaging    MRI Pelvis IMPRESSION: 1. 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 Finding is not suggestive of metastatic disease. 2. Focal tendinopathy of the distal right gluteal tendons at the insertion on the right greater trochanter.        HISTORY OF PRESENTING ILLNESS:  April Orozco 52 y.o. female is here because of her recently diagnosed right breast cancer. She is accompanied by her boyfriend to our multidisciplinary breast clinic today.   She noticed a right breast lump 3 monthsa ago, no pain or tenderness, no skin change or nipple discharge. She feels the lump has been getting bigger over the past 3 months. She called her gynecologist and was finally seen a few weeks ago. She was referred to have a diagnostic mammogram and ultrasound which  showed a 2.7 cm mass at the 8:00 position of the right breast, and also a lobulated lymph nodes in the right axilla. 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  She feels well, denies any skin pain, or other symptoms. No recent weight loss. 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 Her husband died from small cell cancer several years ago, she has 5 daughters. She recently found out that 3 of her paternal cousins had breast cancer.  GYN HISTORY  Menarchal: 17 LMP: regular  Contraceptive: no  HRT: n/a  G5P5: 5 daughters 30-19  CURRENT THERAPY: Tamoxifen 20 mg daily started 02/14/17, held since 12/24/2017, restart 02/06/2018   INTERVAL HISTORY:  April Orozco returns for follow-up. She is doing well overall. She continues on tamoxifen, with good tolerance. She does note join pain while on tamoxifen.   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 Finding is not suggestive of metastatic disease. Focal tendinopathy of the distal right gluteal tendons at the insertion on the right greater trochanter.  On review of systems, she reports weight loss (due to cutting out sugar and dairy), improved pelvic pain, improved lymphedema (improved with new diet), and right shoulder pain. she denies any other symptoms.    MEDICAL HISTORY:  Past Medical History:  Diagnosis Date  . Fracture, sacrum/coccyx ([place]) 1970   "Shattered tailbone"  . Heart murmur    [facility] has evaluated, felt benign  . Malignant neoplasm of lower-outer quadrant of right female breast ([place]) 09/13/2016  . Tachycardia    Intermittent, resolved with decreased  caffeine intake    SURGICAL HISTORY: Past Surgical History:  Procedure Laterality Date  . APPENDECTOMY    . AXILLARY LYMPH NODE DISSECTION Right 04/23/2017   Procedure: RIGHT AXILLARY LYMPH NODE DISSECTION;  Surgeon: Rolm Bookbinder, MD;  Location: [place];  Service: General;  Laterality: Right;  . BREAST LUMPECTOMY WITH AXILLARY LYMPH NODE BIOPSY Right 04/23/2017  . BREAST RECONSTRUCTION WITH PLACEMENT OF TISSUE EXPANDER AND FLEX HD (ACELLULAR HYDRATED DERMIS) Bilateral 04/07/2017   Procedure: BILATERAL BREAST RECONSTRUCTION WITH PLACEMENT OF TISSUE EXPANDER AND FLEX HD (ACELLULAR HYDRATED DERMIS);  Surgeon: Crissie Reese, MD;  Location: [place];  Service: Plastics;  Laterality: Bilateral;  . IR GENERIC HISTORICAL  01/10/2017   IR CV LINE INJECTION 01/10/2017 Greggory Keen, MD [facility]-INTERV RAD  . MASTECTOMY    . MASTECTOMY WITH RADIOACTIVE SEED GUIDED EXCISION AND AXILLARY SENTINEL LYMPH NODE BIOPSY Bilateral 04/07/2017   Procedure: RIGHT SKIN SPARING MASTECTOMY WITH RADIOACTIVE SEED GUIDED AXILLARY LYMPH NODE EXCISION AND AXILLARY SENTINEL LYMPH NODE BIOPSY, LEFT PROPHYLACTIC SKIN SPARING MASTECTOMY;  Surgeon: Rolm Bookbinder, MD;  Location: [place];  Service: General;  Laterality: Bilateral;  . PORT-A-CATH REMOVAL N/A 04/07/2017   Procedure: REMOVAL PORT-A-CATH;  Surgeon: Rolm Bookbinder, MD;  Location: [place];  Service: General;  Laterality: N/A;  . PORTACATH PLACEMENT Right 09/23/2016   Procedure: INSERTION PORT-A-CATH WITH [place];  Surgeon: Rolm Bookbinder, MD;  Location: [place];  Service: General;  Laterality: Right;  . TONSILECTOMY/ADENOIDECTOMY WITH MYRINGOTOMY    . TUBAL LIGATION      SOCIAL HISTORY: Social History   Socioeconomic History  . Marital status: Widowed    Spouse name: Not on file  . Number of children: 5  . Years of education: Not on file  . Highest education level: Not on file  Occupational History  . Occupation: Programmer, multimedia:      Comment: OR trauma nurse  Social Needs  . Financial resource strain: Not on file  . Food insecurity:    Worry: Not on file    Inability: Not on file  . Transportation needs:    Medical: Not on file    Non-medical: Not on file  Tobacco Use  . Smoking status: Never Smoker  . Smokeless tobacco: Never Used  Substance and Sexual Activity  . Alcohol use: Yes    Comment: social drinker   . Drug use: No  . Sexual activity: Not on file  Lifestyle  . Physical activity:    Days per week: Not on file    Minutes per session: Not on file  . Stress: Not on file  Relationships  . Social connections:    Talks on phone: Not on file    Gets together: Not on file    Attends religious service: Not on file  Active member of club or organization: Not on file    Attends meetings of clubs or organizations: Not on file    Relationship status: Not on file  . Intimate partner violence:    Fear of current or ex partner: Not on file    Emotionally abused: Not on file    Physically abused: Not on file    Forced sexual activity: Not on file  Other Topics Concern  . Not on file  Social History Narrative  . Not on file    FAMILY HISTORY: Family History  Problem Relation Age of Onset  . Diabetes Mother   . Diabetes Father   . Skin cancer Father 76       NOS type; worked as a Theme park manager for many years  . Hypertension Other   . Breast cancer Cousin 60       paternal 1st cousin; w/ mets to bone  . Breast cancer Cousin        paternal 1st cousin dx 11-50; s/p lump  . Breast cancer Cousin        paternal 1st cousin dx 65-53 w/ mets to LN; s/p BL mastectomies  . Heart disease Maternal Aunt 58  . Colon cancer Paternal Uncle        dx 67-68  . Goiter Maternal Grandmother        d. bled to death following goiter surgery  . Heart attack Maternal Grandfather        d. early 105s  . Lung cancer Paternal Grandmother        dx 71s; d. 88y  . Diabetes Paternal Grandmother   . Heart failure Paternal  Grandfather        d. 75-76  . Alzheimer's disease Paternal Grandfather   . Other Daughter 39       daughter w/ hx benign breast lump/cyst removed  . Epilepsy Maternal Aunt   . Alzheimer's disease Paternal Aunt   . Brain cancer Cousin 26       paternal 1st cousin; d. 23y; NOS type    ALLERGIES:  is allergic to banana; sulfur; and tramadol.  MEDICATIONS:  Current Outpatient Medications  Medication Sig Dispense Refill  . ALPRAZolam (XANAX) 0.5 MG tablet Take 1 tablet (0.5 mg total) by mouth at bedtime as needed for anxiety. 20 tablet 0  . gabapentin (NEURONTIN) 300 MG capsule Take 1 capsule (300 mg total) by mouth at bedtime. 90 capsule 1  . ibuprofen (ADVIL,MOTRIN) 200 MG tablet Take 800 mg by mouth every 6 (six) hours as needed for headache, mild pain or moderate pain.    . tamoxifen (NOLVADEX) 20 MG tablet Take 1 tablet (20 mg total) by mouth daily. 30 tablet 3  . zolpidem (AMBIEN) 10 MG tablet Take 1 tablet (10 mg total) by mouth at bedtime as needed for sleep. 30 tablet 1   No current facility-administered medications for this visit.     REVIEW OF SYSTEMS:  Constitutional: Denies fevers, chills (+) night sweats, fatigue, hot flashes Eyes: Denies blurriness of vision, double vision or watery eyes Ears, nose, mouth, throat, and face: Denies mucositis or sore throat Respiratory: Denies dyspnea or wheezes Cardiovascular: Denies palpitation, chest discomfort or lower extremity swelling Gastrointestinal:  Denies nausea, heartburn,  Skin: Denies abnormal skin rashes Lymphatics: Denies new lymphadenopathy or easy bruising (+) swelling in hands and feet (+) right arm swelling  Neurological:Denies new weaknesses (+) tingling and numbness in hands and feet (+) left leg nerve pain MSK: (+) hip  pain that radiates to knee bilaterally, improved  Behavioral/Psych: Mood is stable, no new changes (+) depression All other systems were reviewed with the patient and are negative.  PHYSICAL  EXAMINATION:  ECOG PERFORMANCE STATUS: 1  Vitals:   04/02/18 1427  BP: 104/67  Pulse: 85  Resp: 18  Temp: 98.9 F (37.2 C)  SpO2: 99%   Filed Weights   04/02/18 1427  Weight: 164 lb 4.8 oz (74.5 kg)     GENERAL:alert, no distress and comfortable (+) alopecia  SKIN: skin color, texture, turgor are normal, no rashes or significant lesions EYES: normal, conjunctiva are pink and non-injected, sclera clear OROPHARYNX:no exudate, no erythema and lips, buccal mucosa, and tongue normal  NECK: supple, thyroid normal size, non-tender, without nodularity LYMPH:  no palpable lymphadenopathy in the cervical, axillary or inguinal (+) right arm swelling  LUNGS: clear to auscultation and percussion with normal breathing effort HEART: regular rate & rhythm and no murmurs and no lower extremity edema ABDOMEN:abdomen soft, non-tender and normal bowel sounds Musculoskeletal:no cyanosis of digits and no clubbing  PSYCH: alert & oriented x 3 with fluent speech NEURO: (+) Moderately decreased vibration sensation in the extremities. Most prominent on the right upper extremity. Breasts: (+) Post bilateral mastectomy. Right breast: Incision has healed well. There is some soft tissue on both ends of the incision. No palpable mass or drainage.    LABORATORY DATA:  I have reviewed the data as listed CBC Latest Ref Rng & Units 04/02/2018 02/06/2018 12/23/2017  WBC 3.9 - 10.3 K/uL 6.0 6.4 6.3  Hemoglobin 11.6 - 15.9 g/dL 13.2 13.4 14.1  Hematocrit 34.8 - 46.6 % 39.5 40.4 42.8  Platelets 145 - 400 K/uL 215 238 243   CMP Latest Ref Rng & Units 04/02/2018 02/06/2018 12/23/2017  Glucose 70 - 140 mg/dL 134 139 144(H)  BUN 7 - 26 mg/dL _0 Creatinine 0.60 - 1.10 mg/dL 0.80 0.77 0.79  Sodium 136 - 145 mmol/L 140 139 138  Potassium 3.5 - 5.1 mmol/L 4.0 4.0 3.8  Chloride 98 - 109 mmol/L 104 101 100  CO2 22 - 29 mmol/L _1 Calcium 8.4 - 10.4 mg/dL 9.8 10.1 9.7  Total Protein 6.4 - 8.3 g/dL 7.9 8.2 8.2    Total Bilirubin 0.2 - 1.2 mg/dL 0.2 0.2 <0.2(L)  Alkaline Phos 40 - 150 U/L 89 98 100  AST 5 - 34 U/L _2 ALT 0 - 55 U/L _3 PATHOLOGY REPORTS:  Diagnosis 04/23/17 Lymph nodes, regional resection, Right Axillary Contents - NO CARCINOMA IDENTIFIED IN EIGHT LYMPH NODES (0/8)   Diagnosis 04/07/17 1. Breast, simple mastectomy, Left Prophylactic Skin Sparing - FIBROCYSTIC CHANGES WITH SCLEROSING ADENOSIS AND CALCIFICATIONS. - LOBULAR NEOPLASIA (ATYPICAL LOBULAR HYPERPLASIA). 2. Breast, simple mastectomy, Right Skin Sparing - INVASIVE AND IN SITU DUCTAL CARCINOMA, 2.1 CM. - MARGINS NOT INVOLVED. - FIBROCYSTIC CHANGES WITH CALCIFICATIONS AND FOCAL ATYPICAL DUCTAL HYPERPLASIA. - METASTATIC CARCINOMA IN ONE LYMPH NODE WITH ASSOCIATED BIOPSY CAVITY (1/1). 3. Lymph nodes, regional resection, Right Axillary - METASTATIC CARCINOMA IN TWO OF SIX LYMPH NODES (2/6). Microscopic Comment 2. BREAST, STATUS POST NEOADJUVANT TREATMENT Procedure: Skin sparring mastectomy and axillary lymph node excision. Laterality: Right breast. Tumor Size: 2.1 cm. Histologic Type: Ductal. Grade: III Tubular Differentiation: 3 Nuclear Pleomorphism: 3 Mitotic Count: 2 Ductal Carcinoma in Situ (DCIS): Present, high grade with necrosis. Regional Lymph Nodes: Number of Lymph Nodes Examined: 7 Number of Sentinel Lymph Nodes Examined:  0 Lymph Nodes with Macrometastases: 2 Lymph Nodes with Micrometastases: 1 Lymph Nodes with Isolated Tumor Cells: 0 Margins: Free of tumor. Invasive carcinoma, distance from closest margin: 1.6 cm from anterior margin. DCIS, distance from closest margin: 1.6 cm from anterior margin. Extent of Tumor: Skin: Free of tumor. Nipple: Free of tumor. Skeletal Muscle: N/A Breast Prognostic Profile (pre-neoadjuvant case #: [id number]) Estrogen Receptor: 90% and 100%, positive, strong staining. Progesterone Receptor: 90% and 100%, positive, strong staining. Her2: Negative,  1.09 and 1.52 Ki-67: 70% and 15%. Will be repeated on the current case (Block #: [id number] ) and the results reported separately. Residual Cancer Burden (RCB): Primary Tumor Bed: 21 mm x 14 mm Overall Cancer Cellularity: 90% Percentage of Cancer that is in Situ: 5% Number of Positive Lymph Nodes: 3 Diameter of Largest Lymph Node metastasis: 8 mm Residual Cancer Burden : 3.852 Residual Cancer Burden Class: RCB-III Pathologic Stage Classification (p TNM, AJCC 8th Edition): Primary Tumor (ypT): ypT2 Regional Lymph Nodes (ypN): ypN1a (JDP:gt, 04/08/17)   Diagnosis 09/12/2016 1. Lymph node, needle/core biopsy, right axilla - METASTATIC CARCINOMA WITH EXTRACAPSULAR EXTENSION. 2. Breast, right, needle core biopsy, 8 o'clock - INVASIVE DUCTAL CARCINOMA, GRADE 3.   RADIOGRAPHIC STUDIES: I have personally reviewed the radiological images as listed and agreed with the findings in the report. No results found.  . 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 Finding is not suggestive of metastatic disease. Focal tendinopathy of the distal right gluteal tendons at the insertion on the right greater trochanter.  ASSESSMENT & PLAN:  52 y.o. premenopausal Caucasian female, presented with a palpable right breast mass.  1. 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 -She completed 4 cycles of Adriamycin and Cytoxan, proceeded with Taxol (received 3 doses) and one dose docetaxel, developed moderate peripheral neuropathy, chemotherapy was stopped due to neuropathy. -The patient started Tamoxifen the week of 02/14/17 while she was awaiting surgery. She had it held during her breast surgery  -Pt had right  mastectomy 04/07/17 and right axillary lymph node dissection 04/23/17 -I previously reviewed her surgical pathology findings, which showed 3 positive lymph nodes. She did not have a good response to neoadjuvant chemotherapy. She underwent right axilla lymph node dissection. -I did not recommend adjuvant chemo due to her poor tolerance.  -Restarted Tamoxifen 05/2017 after she recovered from surgery. -Pt underwent adjuvant RT with Dr. Lisbeth Renshaw on 08/11/17 and completed on 10/07/17. She had multiple treatment delays.    -we discussed cancer surveillance after completion of radiation with lab and physical exam; she had bilateral mastectomy and will not need mammogram -Held Tamoxifen in early Feb 2019 to see if bone pain improved but it did not. I ordered Whole Body Bone scan to rule out bone metastasis. Bone Scan from 02/05/18 revealed focal increased radiotracer uptake in the right greater trochanter and left superior acetabular regions.  She subsequently underwent a pelvic MRI, which was negative for bone metastasis. -She restarted Tamoxifen, as been tolerating moderately well overall. -Her hip pain has much improved, since she changed her diet (much less sugar, Shanon Brow products and processed food), she is not taking pain medication much. -labs reviewed, CMP is WNL and CBC has RDW at 15.3.  Her exam was unremarkable, will continue tamoxifen. -Survivorship in 4 months  -  Lab and f/u in 8 months  -She is status post bilateral mastectomy, no need routine mammogram.   2. Genetics  -Her young age and strong family history of breast cancer, we'll refer her to his genetics for testing, to rule out inheritable genetic syndrome. -Negative genetic testing on the Breast/Ovarian cancer panel.  Negative genetic testing for the MSH2 inversion analysis ([place] inversion). The Breast/Ovarian gene panel offered by [facility] includes sequencing and rearrangement analysis for the following 20 genes:  ATM, BARD1, BRCA1, BRCA2, BRIP1,  CDH1, CHEK2, EPCAM, FANCC, MLH1, MSH2, MSH6, NBN, PALB2, PMS2, PTEN, RAD51C, RAD51D, TP53, and XRCC2.   The report date is December 19 for the [facility] panel and November 20, 2016 for the [facility] inversion.  3. Anxiety  and insomnia -She has been quite anxious since cancer diagnosis -continue lorazepam 0.5 mg as needed for insomnia and anxiety -Lacie refilled xanax and ambien on 12/23/17 -Refilled xanax today (04/02/2018)  4. Neuropathy, G2 -Chemotherapy induced. -Numbness and tingling of her fingers and toes. -Previously prescribed Neurontin, I recommended her to increase from 100 mg to 251m at night, and gradually titrate the dose to 3064mtid if needed  -Currently on 300 mg Neurontin at bedtime, I advised her she can take 400 mg, refilled today (02/06/2018)  -Overall improved, she is able to function well.  5. Hot flash and night sweats -She has tried Effexor for a few months, and not feel helpful, she has stop it. -Overall improved.  6. Bilateral hip, leg bone pain -She has developed bilateral hip pain occasionally causing difficult ambulation that began in 09/2017, no provoking injury. She has heavy NSAID ingestion; it was suggested she limit NSAIDs and try tylenol, heat, and other nonpharmacologic interventions. -Lacie advised her to hold Tamoxifen for 1 week to see if pain improves. If so, can consider changing to other adjuvant endocrine therapy. -Lacie called pt on 2/12 to f/u, she remained off Tamoxifen at that time with continued pain. I recommend a Whole Body Bone Scan to be done.  -Bone Scan from 02/05/18 revealed focal increased radiotracer uptake in the right greater trochanter and left superior acetabular regions. -She still has significant pain that is not managed that well. 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 Finding is not suggestive of metastatic disease. Focal tendinopathy of the distal right gluteal tendons at the insertion on the right greater trochanter.  I reviewed to the results with patient.  She is pleased. -Bilateral hip pain improved on new diet.  She is not taking prescription pain medication, only take ibuprofen occasionally.  7. Cancer Screening -I previously encouraged her to start seeing a PCP. She is [age] old and I previously encouraged her to have a colonoscopy this year and complete routine pap screening.   Plan  -Refill xanax -Survivorship in 4 months  -Lab and f/u in 8 months  -Continue tamoxifen.  All questions were answered. The patient knows to call the clinic with any problems, questions or concerns.  I spent 20 minutes counseling the patient face to face. The total time spent in the appointment was 25 minutes and more than 50% was on counseling.  This document serves as a record of services personally performed by YaTruitt MerleMD. It was created on her behalf by SoSteva Coldera trained medical scribe. The creation of this record  is based on the scribe's personal observations and the provider's statements to them.   I have reviewed the above documentation for accuracy and completeness, and I agree with the above.    Truitt Merle, MD 04/02/2018 5:09 PM

## 2018-04-02 ENCOUNTER — Encounter: Payer: Self-pay | Admitting: Hematology

## 2018-04-02 ENCOUNTER — Inpatient Hospital Stay: Payer: 59

## 2018-04-02 ENCOUNTER — Inpatient Hospital Stay: Payer: 59 | Attending: Nurse Practitioner | Admitting: Hematology

## 2018-04-02 VITALS — BP 104/67 | HR 85 | Temp 98.9°F | Resp 18 | Ht 65.0 in | Wt 164.3 lb

## 2018-04-02 DIAGNOSIS — Z9013 Acquired absence of bilateral breasts and nipples: Secondary | ICD-10-CM

## 2018-04-02 DIAGNOSIS — R102 Pelvic and perineal pain: Secondary | ICD-10-CM

## 2018-04-02 DIAGNOSIS — Z79810 Long term (current) use of selective estrogen receptor modulators (SERMs): Secondary | ICD-10-CM | POA: Diagnosis not present

## 2018-04-02 DIAGNOSIS — F419 Anxiety disorder, unspecified: Secondary | ICD-10-CM | POA: Diagnosis not present

## 2018-04-02 DIAGNOSIS — Z9221 Personal history of antineoplastic chemotherapy: Secondary | ICD-10-CM | POA: Diagnosis not present

## 2018-04-02 DIAGNOSIS — C773 Secondary and unspecified malignant neoplasm of axilla and upper limb lymph nodes: Secondary | ICD-10-CM

## 2018-04-02 DIAGNOSIS — Z170 Estrogen receptor positive status [ER+]: Principal | ICD-10-CM

## 2018-04-02 DIAGNOSIS — Z923 Personal history of irradiation: Secondary | ICD-10-CM | POA: Diagnosis not present

## 2018-04-02 DIAGNOSIS — C50511 Malignant neoplasm of lower-outer quadrant of right female breast: Secondary | ICD-10-CM

## 2018-04-02 DIAGNOSIS — Z800 Family history of malignant neoplasm of digestive organs: Secondary | ICD-10-CM

## 2018-04-02 DIAGNOSIS — I890 Lymphedema, not elsewhere classified: Secondary | ICD-10-CM

## 2018-04-02 DIAGNOSIS — R634 Abnormal weight loss: Secondary | ICD-10-CM

## 2018-04-02 DIAGNOSIS — Z803 Family history of malignant neoplasm of breast: Secondary | ICD-10-CM

## 2018-04-02 DIAGNOSIS — M1990 Unspecified osteoarthritis, unspecified site: Secondary | ICD-10-CM

## 2018-04-02 DIAGNOSIS — Z79899 Other long term (current) drug therapy: Secondary | ICD-10-CM | POA: Diagnosis not present

## 2018-04-02 DIAGNOSIS — M25511 Pain in right shoulder: Secondary | ICD-10-CM

## 2018-04-02 DIAGNOSIS — G4700 Insomnia, unspecified: Secondary | ICD-10-CM | POA: Diagnosis not present

## 2018-04-02 LAB — CBC WITH DIFFERENTIAL/PLATELET
BASOS ABS: 0 10*3/uL (ref 0.0–0.1)
BASOS PCT: 0 %
EOS ABS: 0.1 10*3/uL (ref 0.0–0.5)
Eosinophils Relative: 1 %
HCT: 39.5 % (ref 34.8–46.6)
HEMOGLOBIN: 13.2 g/dL (ref 11.6–15.9)
Lymphocytes Relative: 19 %
Lymphs Abs: 1.1 10*3/uL (ref 0.9–3.3)
MCH: 29.1 pg (ref 25.1–34.0)
MCHC: 33.4 g/dL (ref 31.5–36.0)
MCV: 87 fL (ref 79.5–101.0)
Monocytes Absolute: 0.3 10*3/uL (ref 0.1–0.9)
Monocytes Relative: 5 %
Neutro Abs: 4.5 10*3/uL (ref 1.5–6.5)
Neutrophils Relative %: 75 %
Platelets: 215 10*3/uL (ref 145–400)
RBC: 4.53 MIL/uL (ref 3.70–5.45)
RDW: 15.3 % — ABNORMAL HIGH (ref 11.2–14.5)
WBC: 6 10*3/uL (ref 3.9–10.3)

## 2018-04-02 LAB — COMPREHENSIVE METABOLIC PANEL
ALBUMIN: 3.9 g/dL (ref 3.5–5.0)
ALK PHOS: 89 U/L (ref 40–150)
ALT: 16 U/L (ref 0–55)
ANION GAP: 7 (ref 3–11)
AST: 20 U/L (ref 5–34)
BUN: 14 mg/dL (ref 7–26)
CALCIUM: 9.8 mg/dL (ref 8.4–10.4)
CO2: 29 mmol/L (ref 22–29)
CREATININE: 0.8 mg/dL (ref 0.60–1.10)
Chloride: 104 mmol/L (ref 98–109)
GFR calc Af Amer: >60 mL/min (ref >60)
GFR calc non Af Amer: >60 mL/min (ref >60)
GLUCOSE: 134 mg/dL (ref 70–140)
Potassium: 4 mmol/L (ref 3.5–5.1)
SODIUM: 140 mmol/L (ref 136–145)
Total Bilirubin: 0.2 mg/dL (ref 0.2–1.2)
Total Protein: 7.9 g/dL (ref 6.4–8.3)

## 2018-04-02 MED ORDER — ALPRAZOLAM 0.5 MG PO TABS: 0.5000 mg | ORAL_TABLET | Freq: Every evening | ORAL | 0 refills | Status: DC | PRN

## 2018-04-03 ENCOUNTER — Telehealth: Payer: Self-pay | Admitting: Hematology

## 2018-04-03 NOTE — Telephone Encounter (Signed)
Appt scheduled Calendar/letter mailed to patient per 5/9 los

## 2018-04-29 ENCOUNTER — Other Ambulatory Visit: Payer: Self-pay | Admitting: Hematology

## 2018-04-29 DIAGNOSIS — G4700 Insomnia, unspecified: Secondary | ICD-10-CM

## 2018-04-29 DIAGNOSIS — C50511 Malignant neoplasm of lower-outer quadrant of right female breast: Secondary | ICD-10-CM

## 2018-04-29 DIAGNOSIS — Z170 Estrogen receptor positive status [ER+]: Principal | ICD-10-CM

## 2018-04-29 DIAGNOSIS — F419 Anxiety disorder, unspecified: Secondary | ICD-10-CM

## 2018-04-30 ENCOUNTER — Other Ambulatory Visit: Payer: Self-pay | Admitting: Nurse Practitioner

## 2018-04-30 DIAGNOSIS — Z170 Estrogen receptor positive status [ER+]: Principal | ICD-10-CM

## 2018-04-30 DIAGNOSIS — G4700 Insomnia, unspecified: Secondary | ICD-10-CM

## 2018-04-30 DIAGNOSIS — F419 Anxiety disorder, unspecified: Secondary | ICD-10-CM

## 2018-04-30 DIAGNOSIS — C50511 Malignant neoplasm of lower-outer quadrant of right female breast: Secondary | ICD-10-CM

## 2018-04-30 MED ORDER — ALPRAZOLAM 0.5 MG PO TABS: 0.5000 mg | ORAL_TABLET | Freq: Every evening | ORAL | 0 refills | Status: DC | PRN

## 2018-05-01 NOTE — Telephone Encounter (Signed)
Was refilled by Cira Rue NP on 04/30/18

## 2018-05-22 ENCOUNTER — Other Ambulatory Visit: Payer: Self-pay | Admitting: Hematology

## 2018-05-22 DIAGNOSIS — Z170 Estrogen receptor positive status [ER+]: Principal | ICD-10-CM

## 2018-05-22 DIAGNOSIS — F419 Anxiety disorder, unspecified: Secondary | ICD-10-CM

## 2018-05-22 DIAGNOSIS — G4700 Insomnia, unspecified: Secondary | ICD-10-CM

## 2018-05-22 DIAGNOSIS — C50511 Malignant neoplasm of lower-outer quadrant of right female breast: Secondary | ICD-10-CM

## 2018-05-25 ENCOUNTER — Other Ambulatory Visit: Payer: Self-pay | Admitting: Nurse Practitioner

## 2018-05-25 ENCOUNTER — Other Ambulatory Visit: Payer: Self-pay

## 2018-05-25 DIAGNOSIS — C50511 Malignant neoplasm of lower-outer quadrant of right female breast: Secondary | ICD-10-CM

## 2018-05-25 DIAGNOSIS — F419 Anxiety disorder, unspecified: Secondary | ICD-10-CM

## 2018-05-25 DIAGNOSIS — G4700 Insomnia, unspecified: Secondary | ICD-10-CM

## 2018-05-25 DIAGNOSIS — Z170 Estrogen receptor positive status [ER+]: Principal | ICD-10-CM

## 2018-05-25 MED ORDER — ZOLPIDEM TARTRATE 10 MG PO TABS: 10.0000 mg | ORAL_TABLET | Freq: Every evening | ORAL | 0 refills | Status: DC | PRN

## 2018-06-03 ENCOUNTER — Other Ambulatory Visit: Payer: Self-pay | Admitting: Hematology

## 2018-06-03 DIAGNOSIS — C50511 Malignant neoplasm of lower-outer quadrant of right female breast: Secondary | ICD-10-CM

## 2018-06-03 DIAGNOSIS — F419 Anxiety disorder, unspecified: Secondary | ICD-10-CM

## 2018-06-03 DIAGNOSIS — G4700 Insomnia, unspecified: Secondary | ICD-10-CM

## 2018-06-03 DIAGNOSIS — Z170 Estrogen receptor positive status [ER+]: Principal | ICD-10-CM

## 2018-06-04 ENCOUNTER — Other Ambulatory Visit: Payer: Self-pay | Admitting: Nurse Practitioner

## 2018-07-22 ENCOUNTER — Telehealth: Payer: Self-pay

## 2018-07-22 NOTE — Telephone Encounter (Signed)
Faxed signed order back to [facility].  

## 2018-07-29 ENCOUNTER — Telehealth: Payer: Self-pay

## 2018-07-29 NOTE — Telephone Encounter (Signed)
Spoke with patient reminding of SCP visit on 08/06/18 at 10 am.  Patient was unaware of appt but will check her schedule and will try to come, otherwise she said she will call back to reschedule.

## 2018-08-06 ENCOUNTER — Ambulatory Visit: Payer: 59 | Admitting: Hematology

## 2018-08-06 ENCOUNTER — Telehealth: Payer: Self-pay

## 2018-08-06 ENCOUNTER — Inpatient Hospital Stay: Payer: Self-pay | Admitting: Adult Health

## 2018-08-06 ENCOUNTER — Other Ambulatory Visit: Payer: 59

## 2018-08-06 NOTE — Telephone Encounter (Signed)
Left a detailed message for the patient concerning her call to cancel her appointment on 9/13 with [facility] with [facility]. Per 9/12 return calls

## 2018-08-06 NOTE — Progress Notes (Deleted)
CLINIC:  Survivorship   REASON FOR VISIT:  Routine follow-up post-treatment for a recent history of breast cancer.  BRIEF ONCOLOGIC HISTORY:  Oncology History   Cancer Staging Malignant neoplasm of lower-outer quadrant of right female breast [facility]) Staging form: Breast, AJCC 7th Edition - Clinical stage from 09/12/2016: Stage IIB (T2, N1, M0) - Signed by Truitt Merle, MD on 09/18/2016 - Pathologic stage from 04/07/2017: Stage IIB (T2, N1a, cM0) - Signed by Truitt Merle, MD on 05/10/2017       Malignant neoplasm of lower-outer quadrant of right female breast ([place])   09/10/2016 Mammogram    Mammogram and ultrasound showed a 2.7 cm mass at 8 clock position of the right breast, there is also a lobulated lymph nodes in the right axilla, slightly suspicious for malignancy.     09/12/2016 Initial Diagnosis    Malignant neoplasm of lower-outer quadrant of right female breast ([place])    09/12/2016 Initial Biopsy    Right breast 8:00 core needle biopsy and right axillary lymph node biopsy showed metastatic ductal carcinoma, node has extracapsular extension, grade 3    09/12/2016 Receptors her2    ER 90-100% positive, PR  90-100% positive, HER-2 negative, Ki-67 15% in breast mass, 70% in node     10/01/2016 - 01/24/2017 Neo-Adjuvant Chemotherapy    Neoadjuvant Adriamycin and Cytoxan, every 2 weeks, with Neulasta, for total 4 cycles, followed by weekly Taxol for 12 weeks. 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 Docetaxel 22m/m2 on 01/24/17, was to be given every 3 weeks but stopped due to worsening neuropathy (only 1 cycle given)    11/20/2016 Genetic Testing    Negative genetic testing on the Breast/Ovarian cancer panel.  Negative genetic testing for the MSH2 inversion analysis ([place] inversion). The Breast/Ovarian gene panel offered by [facility] includes sequencing and rearrangement analysis for the following 20 genes:  ATM, BARD1, BRCA1, BRCA2, BRIP1, CDH1, CHEK2, EPCAM, FANCC, MLH1,  MSH2, MSH6, NBN, PALB2, PMS2, PTEN, RAD51C, RAD51D, TP53, and XRCC2.   [facility] and November 20, 2016 for the [facility].    02/14/2017 -  Anti-estrogen oral therapy    Tamoxifen 20 mg daily starting 02/14/17, held since surgery on 04/07/2017, restart 05/22/17    02/22/2017 Breast MRI    IMPRESSION: 1. Significantly smaller mass within the lower outer quadrant of the right breast, now measuring 1.5 cm (previously 2.7 cm). 2. Smaller right axillary lymph nodes.    04/07/2017 Surgery    RIGHT SKIN SPARING MASTECTOMY WITH RADIOACTIVE SEED GUIDED AXILLARY LYMPH NODE EXCISION AND AXILLARY SENTINEL LYMPH NODE BIOPSY, LEFT PROPHYLACTIC SKIN SPARING MASTECTOMY by Dr. WDonne Hazel    04/07/2017 Pathology Results    Diagnosis 04/07/17 1. Breast, simple mastectomy, Left Prophylactic Skin Sparing - FIBROCYSTIC CHANGES WITH SCLEROSING ADENOSIS AND CALCIFICATIONS. - LOBULAR NEOPLASIA (ATYPICAL LOBULAR HYPERPLASIA). 2. Breast, simple mastectomy, Right Skin Sparing - INVASIVE AND IN SITU DUCTAL CARCINOMA, 2.1 CM. - MARGINS NOT INVOLVED. - FIBROCYSTIC CHANGES WITH CALCIFICATIONS AND FOCAL ATYPICAL DUCTAL HYPERPLASIA. - METASTATIC CARCINOMA IN ONE LYMPH NODE WITH ASSOCIATED BIOPSY CAVITY (1/1). 3. Lymph nodes, regional resection, Right Axillary - METASTATIC CARCINOMA IN TWO OF SIX LYMPH NODES (2/6).    04/23/2017 Surgery    RIGHT AXILLARY LYMPH NODE DISSECTION by Dr. WDonne Hazel    04/23/2017 Pathology Results    Diagnosis 04/23/17 Lymph nodes, regional resection, Right Axillary Contents - NO CARCINOMA IDENTIFIED IN EIGHT LYMPH NODES (0/8)  06/03/2017 Pathology Results    Skin right breast mastectomy and wound with results of: Skin with ulceration, acute suppurative inflammation and foci of suture material with giant cell reaction. No evidence of malignancy.     08/11/2017 - 10/07/2017 Radiation Therapy    Adjuvant breast and axilla radiation     02/05/2018  Imaging    Whole Body Bone Scan 02/05/18 IMPRESSION: 1. Focal increased radiotracer uptake in the right greater trochanter and left superior acetabular regions. Advise pelvic radiographic examination to further evaluate these areas for possible metastatic foci. It is possible that these areas show abnormal uptake due to asymmetric areas of arthropathy. 2. Probable arthropathy in the shoulders, elbows, and knees. Increased uptake of radiotracer in the mid face is likely due to paranasal sinus disease. 3. Increased radiotracer uptake throughout much of the left breast may be a nonspecific finding. This finding also may be seen due to breast carcinoma. Appropriate assessment of the left breast in this regard advised.    02/12/2018 Imaging    MRI Pelvis IMPRESSION: 1. 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 Finding is not suggestive of metastatic disease. 2. Focal tendinopathy of the distal right gluteal tendons at the insertion on the right greater trochanter.     INTERVAL HISTORY:  Ms. Morro presents to [facility] today for our initial meeting to review her survivorship care plan detailing her treatment course for breast cancer, as well as monitoring long-term side effects of that treatment, education regarding health maintenance, screening, and overall wellness and health promotion.     Overall, Ms. Veenstra reports feeling quite well since completing her radiation therapy approximately 3 months ago.  She ***    REVIEW OF SYSTEMS:  Review of Systems - Oncology Breast: Denies any new nodularity, masses, tenderness, nipple changes, or nipple discharge.      ONCOLOGY TREATMENT TEAM:  1. Surgeon:  Dr. Marland Kitchen at [facility] 2. Medical Oncologist: Dr. Marland Kitchen  3. Radiation Oncologist: Dr. Marland Kitchen    PAST MEDICAL/SURGICAL HISTORY:  Past Medical History:  Diagnosis Date  .  Fracture, sacrum/coccyx ([place]) 1970   "Shattered tailbone"  . Heart murmur    [facility] has evaluated, felt benign  . Malignant neoplasm of lower-outer quadrant of right female breast ([place]) 09/13/2016  . Tachycardia    Intermittent, resolved with decreased caffeine intake   Past Surgical History:  Procedure Laterality Date  . APPENDECTOMY    . AXILLARY LYMPH NODE DISSECTION Right 04/23/2017   Procedure: RIGHT AXILLARY LYMPH NODE DISSECTION;  Surgeon: Rolm Bookbinder, MD;  Location: [place];  Service: General;  Laterality: Right;  . BREAST LUMPECTOMY WITH AXILLARY LYMPH NODE BIOPSY Right 04/23/2017  . BREAST RECONSTRUCTION WITH PLACEMENT OF TISSUE EXPANDER AND FLEX HD (ACELLULAR HYDRATED DERMIS) Bilateral 04/07/2017   Procedure: BILATERAL BREAST RECONSTRUCTION WITH PLACEMENT OF TISSUE EXPANDER AND FLEX HD (ACELLULAR HYDRATED DERMIS);  Surgeon: Crissie Reese, MD;  Location: [place];  Service: Plastics;  Laterality: Bilateral;  . IR GENERIC HISTORICAL  01/10/2017   IR CV LINE INJECTION 01/10/2017 Greggory Keen, MD [facility]-INTERV RAD  . MASTECTOMY    . MASTECTOMY WITH RADIOACTIVE SEED GUIDED EXCISION AND AXILLARY SENTINEL LYMPH NODE BIOPSY Bilateral 04/07/2017   Procedure: RIGHT SKIN SPARING MASTECTOMY WITH RADIOACTIVE SEED GUIDED AXILLARY LYMPH NODE EXCISION AND AXILLARY SENTINEL LYMPH NODE BIOPSY, LEFT PROPHYLACTIC SKIN SPARING MASTECTOMY;  Surgeon: Rolm Bookbinder, MD;  Location: [place];  Service: General;  Laterality: Bilateral;  . PORT-A-CATH REMOVAL N/A 04/07/2017   Procedure: REMOVAL PORT-A-CATH;  Surgeon: Rolm Bookbinder, MD;  Location: [place];  Service: General;  Laterality: N/A;  . PORTACATH PLACEMENT Right 09/23/2016   Procedure: INSERTION PORT-A-CATH WITH [place];  Surgeon: Rolm Bookbinder, MD;  Location: [place];  Service: General;  Laterality: Right;  . TONSILECTOMY/ADENOIDECTOMY WITH MYRINGOTOMY    . TUBAL LIGATION       ALLERGIES:  Allergies  Allergen  Reactions  . Banana Anaphylaxis  . Sulfur Hives  . Tramadol Rash     CURRENT MEDICATIONS:  Outpatient Encounter Medications as of 08/06/2018  Medication Sig  . ALPRAZolam (XANAX) 0.5 MG tablet Take 1 tablet (0.5 mg total) by mouth at bedtime as needed for anxiety.  . gabapentin (NEURONTIN) 300 MG capsule Take 1 capsule (300 mg total) by mouth at bedtime.  Marland Kitchen ibuprofen (ADVIL,MOTRIN) 200 MG tablet Take 800 mg by mouth every 6 (six) hours as needed for headache, mild pain or moderate pain.  . tamoxifen (NOLVADEX) 20 MG tablet Take 1 tablet (20 mg total) by mouth daily.  Marland Kitchen zolpidem (AMBIEN) 10 MG tablet Take 1 tablet (10 mg total) by mouth at bedtime as needed. for sleep   No facility-administered encounter medications on file as of 08/06/2018.      ONCOLOGIC FAMILY HISTORY:  Family History  Problem Relation Age of Onset  . Diabetes Mother   . Diabetes Father   . Skin cancer Father 73       NOS type; worked as a Theme park manager for many years  . Hypertension Other   . Breast cancer Cousin 43       paternal 1st cousin; w/ mets to bone  . Breast cancer Cousin        paternal 1st cousin dx 42-50; s/p lump  . Breast cancer Cousin        paternal 1st cousin dx 39-53 w/ mets to LN; s/p BL mastectomies  . Heart disease Maternal Aunt 58  . Colon cancer Paternal Uncle        dx 67-68  . Goiter Maternal Grandmother        d. bled to death following goiter surgery  . Heart attack Maternal Grandfather        d. early 62s  . Lung cancer Paternal Grandmother        dx 52s; d. 88y  . Diabetes Paternal Grandmother   . Heart failure Paternal Grandfather        d. 75-76  . Alzheimer's disease Paternal Grandfather   . Other Daughter 37       daughter w/ hx benign breast lump/cyst removed  . Epilepsy Maternal Aunt   . Alzheimer's disease Paternal Aunt   . Brain cancer Cousin 38       paternal 1st cousin; d. 26y; NOS type     GENETIC COUNSELING/TESTING: ***  SOCIAL HISTORY:  AMEENA VESEY is  /single/married/divorced/widowed/separated and lives alone/with her spouse/family/friend in (city), [place].  She has (#) children and they live in (city).  Ms. Schneck is currently retired/disabled/working part-time/full-time as ***.  She denies any current or history of tobacco, alcohol, or illicit drug use.     PHYSICAL EXAMINATION:  Vital Signs:  There were no vitals filed for this visit. There were no vitals filed for this visit. General: Well-nourished, well-appearing female in no acute distress.  She is unaccompanied/accompanied in clinic by her ***** today.   HEENT: Head is normocephalic.  Pupils equal  and reactive to light. Conjunctivae clear without exudate.  Sclerae anicteric. Oral mucosa is pink, moist.  Oropharynx is pink without lesions or erythema.  Lymph: No cervical, supraclavicular, or infraclavicular lymphadenopathy noted on palpation.  Cardiovascular: Regular rate and rhythm.Marland Kitchen Respiratory: Clear to auscultation bilaterally. Chest expansion symmetric; breathing non-labored.  GI: Abdomen soft and round; non-tender, non-distended. Bowel sounds normoactive.  GU: Deferred.  Neuro: No focal deficits. Steady gait.  Psych: Mood and affect normal and appropriate for situation.  Extremities: No edema. MSK: No focal spinal tenderness to palpation.  Full range of motion in bilateral upper extremities Skin: Warm and dry.  LABORATORY DATA:  None for this visit.  DIAGNOSTIC IMAGING:  None for this visit.      ASSESSMENT AND PLAN:  Ms.. Orozco is a pleasant 52 y.o. female with Stage IIB right breast invasive ductal carcinoma, ER+/PR+/HER2-, diagnosed in 08/2016, treated with neo adjuvant chemotherapy, bilateral mastectomy, adjuvant radiation therapy, and anti-estrogen therapy with Tamoxifen in 04/2017.  She presents to the Survivorship Clinic for our initial meeting and routine follow-up post-completion of treatment for breast cancer.    1. Stage IIB right breast cancer:  Ms.  Zemanek is continuing to recover from definitive treatment for breast cancer. She will follow-up with her medical oncologist, Dr. Burr Medico in 11/2018 with history and physical exam per surveillance protocol.  She will continue her anti-estrogen therapy with Tamoxifen. Thus far, she is tolerating the Tamoxifen well, with minimal side effects. Today, a comprehensive survivorship care plan and treatment summary was reviewed with the patient today detailing her breast cancer diagnosis, treatment course, potential late/long-term effects of treatment, appropriate follow-up care with recommendations for the future, and patient education resources.  A copy of this summary, along with a letter will be sent to the patient's primary care provider via mail/fax/In Basket message after today's visit.    #. Problem(s) at Visit______________  #. Bone health:  Given Ms. Tiner's history of breast cancer, she is at risk for bone demineralization.  I counseled her that Tamoxifen will have a protective effect on her bones.  She was given education on specific activities to promote bone health.  #. Cancer screening:  Due to Ms. Colver's history and her age, she should receive screening for skin cancers, colon cancer, and gynecologic cancers.  The information and recommendations are listed on the patient's comprehensive care plan/treatment summary and were reviewed in detail with the patient.    #. Health maintenance and wellness promotion: Ms. Ahart was encouraged to consume 5-7 servings of fruits and vegetables per day. We reviewed the "Nutrition Rainbow" handout, as well as the handout "Take Control of Your Health and Reduce Your Cancer Risk" from the [place].  She was also encouraged to engage in moderate to vigorous exercise for 30 minutes per day most days of the week. We discussed the [employer] fitness program, which is designed for cancer survivors to help them become more physically fit after cancer  treatments.  She was instructed to limit her alcohol consumption and continue to abstain from tobacco use.     #. Support services/counseling: It is not uncommon for this period of the patient's cancer care trajectory to be one of many emotions and stressors.  We discussed an opportunity for her to participate in the next session of [facility] ("Finding Your New Normal") support group series designed for patients after they have completed treatment.   Ms. Dambrosia was encouraged to take advantage of our many other support services programs,  support groups, and/or counseling in coping with her new life as a cancer survivor after completing anti-cancer treatment.  She was given information regarding our available services and encouraged to contact me with any questions or for help enrolling in any of our support group/programs.    Dispo:   -Return to cancer center in 11/2018 for f/u with Dr. Burr Medico -Follow up with surgery *** -She is welcome to return back to the Survivorship Clinic at any time; no additional follow-up needed at this time.  -Consider referral back to survivorship as a long-term survivor for continued surveillance  A total of (30) minutes of face-to-face time was spent with this patient with greater than 50% of that time in counseling and care-coordination.   Gardenia Phlegm, NP Survivorship Program [place] [phone]   Note: PRIMARY CARE PROVIDER Patient, No Pcp Per None None

## 2018-08-06 NOTE — Addendum Note (Signed)
Encounter addended by: Hayden Pedro, PA-C on: 08/06/2018 12:39 PM  Actions taken: Sign clinical note

## 2018-08-08 ENCOUNTER — Other Ambulatory Visit: Payer: Self-pay | Admitting: Hematology

## 2018-08-21 ENCOUNTER — Ambulatory Visit: Payer: 59 | Admitting: Hematology

## 2018-08-21 ENCOUNTER — Other Ambulatory Visit: Payer: 59

## 2018-10-14 ENCOUNTER — Telehealth: Payer: Self-pay | Admitting: Hematology

## 2018-10-14 NOTE — Telephone Encounter (Signed)
Faxed office notes for last 5 years to [phone] ([place]) Release [phone]

## 2018-12-02 NOTE — Progress Notes (Addendum)
[place]   [phone] [phone]   Clinic Follow up Note   Patient Care Team: Patient, No Pcp Per as PCP - General (General Practice) Rolm Bookbinder, MD as Consulting Physician (General Surgery) Truitt Merle, MD as Consulting Physician (Hematology) Kyung Rudd, MD as Consulting Physician (Radiation Oncology) Delice Bison, Charlestine Massed, NP as Nurse Practitioner (Hematology and Oncology)  Date of Service:  12/04/2018  CHIEF COMPLAINT: Follow up right breast cancer  SUMMARY OF ONCOLOGIC HISTORY: Oncology History   Cancer Staging Malignant neoplasm of lower-outer quadrant of right female breast [facility]) Staging form: Breast, AJCC 7th Edition - Clinical stage from 09/12/2016: Stage IIB (T2, N1, M0) - Signed by Truitt Merle, MD on 09/18/2016 - Pathologic stage from 04/07/2017: Stage IIB (T2, N1a, cM0) - Signed by Truitt Merle, MD on 05/10/2017       Malignant neoplasm of lower-outer quadrant of right female breast ([place])   09/10/2016 Mammogram    Mammogram and ultrasound showed a 2.7 cm mass at 8 clock position of the right breast, there is also a lobulated lymph nodes in the right axilla, slightly suspicious for malignancy.     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 Docetaxel 41m/m2 on 01/24/17, was to be given every 3 weeks but stopped due to worsening  neuropathy (only 1 cycle given)    11/20/2016 Genetic Testing    Negative genetic testing on the Breast/Ovarian cancer panel.  Negative genetic testing for the MSH2 inversion analysis ([place] inversion). The Breast/Ovarian gene panel offered by [facility] includes sequencing and rearrangement analysis for the following 20 genes:  ATM, BARD1, BRCA1, BRCA2, BRIP1, CDH1, CHEK2, EPCAM, FANCC, MLH1, MSH2, MSH6, NBN, PALB2, PMS2, PTEN, RAD51C, RAD51D, TP53, and XRCC2.   The report date is December 19 [facility] and November 20, 2016 for the [facility].    02/14/2017 -  Anti-estrogen oral therapy    Tamoxifen 20 mg daily starting 02/14/17, held since surgery on 04/07/2017, restart 05/22/17    02/22/2017 Breast MRI    IMPRESSION: 1. Significantly smaller mass within the lower outer quadrant of the right breast, now measuring 1.5 cm (previously 2.7 cm). 2. Smaller right axillary lymph nodes.    04/07/2017 Surgery    RIGHT SKIN SPARING MASTECTOMY WITH RADIOACTIVE SEED GUIDED AXILLARY LYMPH NODE EXCISION AND AXILLARY SENTINEL LYMPH NODE BIOPSY, LEFT PROPHYLACTIC SKIN SPARING MASTECTOMY by Dr. WDonne Hazel    04/07/2017 Pathology Results    Diagnosis 04/07/17 1. Breast, simple mastectomy, Left Prophylactic Skin Sparing - FIBROCYSTIC CHANGES WITH SCLEROSING ADENOSIS AND CALCIFICATIONS. - LOBULAR NEOPLASIA (ATYPICAL LOBULAR HYPERPLASIA). 2. Breast, simple mastectomy, Right Skin Sparing - INVASIVE AND IN SITU DUCTAL CARCINOMA, 2.1 CM. - MARGINS NOT INVOLVED. - FIBROCYSTIC CHANGES WITH CALCIFICATIONS AND FOCAL ATYPICAL DUCTAL HYPERPLASIA. - METASTATIC CARCINOMA IN ONE LYMPH NODE WITH ASSOCIATED BIOPSY CAVITY (1/1). 3. Lymph nodes, regional resection, Right Axillary -  METASTATIC CARCINOMA IN TWO OF SIX LYMPH NODES (2/6).    04/23/2017 Surgery    RIGHT AXILLARY LYMPH NODE DISSECTION by Dr. Donne Hazel     04/23/2017 Pathology Results    Diagnosis 04/23/17 Lymph nodes, regional resection, Right Axillary  Contents - NO CARCINOMA IDENTIFIED IN EIGHT LYMPH NODES (0/8)      06/03/2017 Pathology Results    Skin right breast mastectomy and wound with results of: Skin with ulceration, acute suppurative inflammation and foci of suture material with giant cell reaction. No evidence of malignancy.     08/11/2017 - 10/07/2017 Radiation Therapy    Adjuvant breast and axilla radiation     02/05/2018 Imaging    Whole Body Bone Scan 02/05/18 IMPRESSION: 1. Focal increased radiotracer uptake in the right greater trochanter and left superior acetabular regions. Advise pelvic radiographic examination to further evaluate these areas for possible metastatic foci. It is possible that these areas show abnormal uptake due to asymmetric areas of arthropathy. 2. Probable arthropathy in the shoulders, elbows, and knees. Increased uptake of radiotracer in the mid face is likely due to paranasal sinus disease. 3. Increased radiotracer uptake throughout much of the left breast may be a nonspecific finding. This finding also may be seen due to breast carcinoma. Appropriate assessment of the left breast in this regard advised.    02/12/2018 Imaging    MRI Pelvis IMPRESSION: 1. 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 Finding is not suggestive of metastatic disease. 2. Focal tendinopathy of the distal right gluteal tendons at the insertion on the right greater trochanter.      CURRENT THERAPY:  Tamoxifen 20 mg daily started 02/14/17, held since 12/24/2017, restart 02/06/2018  INTERVAL HISTORY:  April Orozco is here for a follow up of right breast cancer. She presents to the clinic today by herself. She notes she has been able to get in touch with my office to get refill of Ambien. She has been taking benadryl in interim.  She notes right mid chest shooting pain occasionally. She notes SOB recently with any type of  exertion. She does see a cardiologist and will see him soon. She would like [place] to evaluate her right chest wall nodule.    REVIEW OF SYSTEMS:   Constitutional: Denies fevers, chills or abnormal weight loss Eyes: Denies blurriness of vision Ears, nose, mouth, throat, and face: Denies mucositis or sore throat Respiratory: Denies cough or  Wheezes (+) SOB upon exertion  Cardiovascular: Denies palpitation, chest discomfort or lower extremity swelling Gastrointestinal:  Denies nausea, heartburn or change in bowel habits Skin: Denies abnormal skin rashes Lymphatics: Denies new lymphadenopathy or easy bruising Neurological:Denies numbness, tingling or new weaknesses Behavioral/Psych: Mood is stable, no new changes  Breast: (+) right mid chest shooting pain (+) right breast lymphedema and nodule  All other systems were reviewed with the patient and are negative.  MEDICAL HISTORY:  Past Medical History:  Diagnosis Date  . Fracture, sacrum/coccyx ([place]) 1970   "Shattered tailbone"  . Heart murmur    [facility] Cardiovascular has evaluated, felt benign  . Malignant neoplasm of lower-outer quadrant of right female breast ([place]) 09/13/2016  . Tachycardia    Intermittent, resolved with decreased caffeine intake    SURGICAL HISTORY: Past Surgical History:  Procedure Laterality Date  . APPENDECTOMY    . AXILLARY LYMPH NODE DISSECTION Right 04/23/2017   Procedure: RIGHT AXILLARY LYMPH NODE DISSECTION;  Surgeon: Rolm Bookbinder,  MD;  Location: [facility];  Service: General;  Laterality: Right;  . BREAST LUMPECTOMY WITH AXILLARY LYMPH NODE BIOPSY Right 04/23/2017  . BREAST RECONSTRUCTION WITH PLACEMENT OF TISSUE EXPANDER AND FLEX HD (ACELLULAR HYDRATED DERMIS) Bilateral 04/07/2017   Procedure: BILATERAL BREAST RECONSTRUCTION WITH PLACEMENT OF TISSUE EXPANDER AND FLEX HD (ACELLULAR HYDRATED DERMIS);  Surgeon: Crissie Reese, MD;  Location: [facility];  Service: Plastics;  Laterality: Bilateral;  . IR GENERIC  HISTORICAL  01/10/2017   IR CV LINE INJECTION 01/10/2017 Greggory Keen, MD [facility]-INTERV RAD  . MASTECTOMY    . MASTECTOMY WITH RADIOACTIVE SEED GUIDED EXCISION AND AXILLARY SENTINEL LYMPH NODE BIOPSY Bilateral 04/07/2017   Procedure: RIGHT SKIN SPARING MASTECTOMY WITH RADIOACTIVE SEED GUIDED AXILLARY LYMPH NODE EXCISION AND AXILLARY SENTINEL LYMPH NODE BIOPSY, LEFT PROPHYLACTIC SKIN SPARING MASTECTOMY;  Surgeon: Rolm Bookbinder, MD;  Location: [place];  Service: General;  Laterality: Bilateral;  . PORT-A-CATH REMOVAL N/A 04/07/2017   Procedure: REMOVAL PORT-A-CATH;  Surgeon: Rolm Bookbinder, MD;  Location: [place];  Service: General;  Laterality: N/A;  . PORTACATH PLACEMENT Right 09/23/2016   Procedure: INSERTION PORT-A-CATH WITH [place];  Surgeon: Rolm Bookbinder, MD;  Location: [place];  Service: General;  Laterality: Right;  . TONSILECTOMY/ADENOIDECTOMY WITH MYRINGOTOMY    . TUBAL LIGATION      I have reviewed the social history and family history with the patient and they are unchanged from previous note.  ALLERGIES:  is allergic to banana; sulfur; and tramadol.  MEDICATIONS:  Current Outpatient Medications  Medication Sig Dispense Refill  . Folic Acid-Vit [id number] U04 (FOLBEE) 2.5-25-1 MG TABS tablet Take 1 tablet by mouth daily.    Marland Kitchen gabapentin (NEURONTIN) 300 MG capsule Take 2 capsules (600 mg total) by mouth at bedtime. 180 capsule 1  . ibuprofen (ADVIL,MOTRIN) 200 MG tablet Take 800 mg by mouth every 6 (six) hours as needed for headache, mild pain or moderate pain.    . tamoxifen (NOLVADEX) 20 MG tablet Take 1 tablet (20 mg total) by mouth daily. 30 tablet 3  . zolpidem (AMBIEN) 10 MG tablet Take 1 tablet (10 mg total) by mouth at bedtime as needed. for sleep 30 tablet 1  . ALPRAZolam (XANAX) 0.5 MG tablet Take 1 tablet (0.5 mg total) by mouth at bedtime as needed for anxiety. (Patient not taking: Reported on 12/04/2018) 20 tablet 0   No current facility-administered  medications for this visit.     PHYSICAL EXAMINATION: ECOG PERFORMANCE STATUS: 0 - Asymptomatic  Vitals:   12/04/18 0930  BP: 114/62  Pulse: 84  Resp: 18  Temp: 98.7 F (37.1 C)  SpO2: 98%   Filed Weights   12/04/18 0930  Weight: 172 lb 4.8 oz (78.2 kg)    GENERAL:alert, no distress and comfortable SKIN: skin color, texture, turgor are normal, no rashes or significant lesions EYES: normal, Conjunctiva are pink and non-injected, sclera clear OROPHARYNX:no exudate, no erythema and lips, buccal mucosa, and tongue normal  NECK: supple, thyroid normal size, non-tender, without nodularity LYMPH:  no palpable lymphadenopathy in the cervical, axillary or inguinal LUNGS: clear to auscultation and percussion with normal breathing effort HEART: regular rate & rhythm and no murmurs and no lower extremity edema ABDOMEN:abdomen soft, non-tender and normal bowel sounds Musculoskeletal:no cyanosis of digits and no clubbing  NEURO: alert & oriented x 3 with fluent speech, no focal motor/sensory deficits BREAST: S/p B/l mastectomy with tissue expander: (+) b/l breast surgically absent (+) surgical incisions healed well with 0.5cm nodule in mid of  right mastectomy incision line, tender. No other palpable mass or adenopathy    LABORATORY DATA:  I have reviewed the data as listed CBC Latest Ref Rng & Units 12/04/2018 04/02/2018 02/06/2018  WBC 4.0 - 10.5 K/uL 5.5 6.0 6.4  Hemoglobin 12.0 - 15.0 g/dL 13.6 13.2 13.4  Hematocrit 36.0 - 46.0 % 41.1 39.5 40.4  Platelets 150 - 400 K/uL 234 215 238     CMP Latest Ref Rng & Units 12/04/2018 04/02/2018 02/06/2018  Glucose 70 - 99 mg/dL 113(H) 134 139  BUN 6 - 20 mg/dL _0 Creatinine 0.44 - 1.00 mg/dL 0.76 0.80 0.77  Sodium 135 - 145 mmol/L 138 140 139  Potassium 3.5 - 5.1 mmol/L 4.1 4.0 4.0  Chloride 98 - 111 mmol/L 103 104 101  CO2 22 - 32 mmol/L _1 Calcium 8.9 - 10.3 mg/dL 9.6 9.8 10.1  Total Protein 6.5 - 8.1 g/dL 8.2(H) 7.9 8.2  Total  Bilirubin 0.3 - 1.2 mg/dL 0.3 0.2 0.2  Alkaline Phos 38 - 126 U/L 94 89 98  AST 15 - 41 U/L _2 ALT 0 - 44 U/L _3 RADIOGRAPHIC STUDIES: I have personally reviewed the radiological images as listed and agreed with the findings in the report. No results found.   ASSESSMENT & PLAN:  April Orozco is a 53 y.o. female with   1. 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 She is s/p neoadjuvant chemo with AC-T, B/l mastectomy and right axillary LN dissection and adjuvant radiation.  -She is currently on antiestrogen therapy with Tamoxifen. Tolerating well with moderate hot flash. Although she is likely post-menopausal she is concern with side effects of AI, especially arthralgia, so she will continue with Tamoxifen.  -She is clinically doing well. Lab reviewed, her CBC and CMP are within normal limits. Her physical exam was unremarkable, except 0.5cm nodule in mid of right mastectomy incision line, tender. I will get an [place] of right chest wall to rule out local recurrence  -She is status post bilateral mastectomy, no need routine mammogram.  She has had tissue expander in the left breast, she wants to wait longer for her reconstruction due to the concern of local recurrence. -F/u in 4 months   2. Genetics, Testing was negative   3. Anxiety  and insomnia -She has been quite anxious since cancer diagnosis -Overall improved since she completed major breast cancer treatment -She ran out of Ambien a few months ago, has not been able to sleep well.  We discussed dependence from Ambien, she is willing to weaned off.  I refilled her Ambien today, she will take 5 mg daily tonight, and gradually wean off.  4. Neuropathy, G1 -Chemotherapy induced. -Numbness and tingling of her fingers and toes, overall improved -Controlled with Gabapentin. I recommend she  increase to 681m at night which will also help her sleep. I refilled today (12/04/2018)   5. Hot flash and night sweats -Stable  -Effexor was not effective. She will increase her Gabapentin to 6016mnightly.   6. 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 Finding is not suggestive of metastatic disease. Focal tendinopathy of the distal right gluteal tendons at the insertion on the right  greater trochanter -Bilateral hip pain improved on new diet with no sugar She is not taking prescription pain medication, only take ibuprofen occasionally.  7. SOB upon exertion  -I recommend repeating her echocardiogram, due to her previous Adriamycin exposure  Plan  -Refill Gabapentin and Ambien today, she agrees to gradually wean off Ambien -continue Tamoxifen  -[place] of right breast in 1-2 weeks -Lab and f/u in 4 months -echo in 2-3 weeks   No problem-specific Assessment & Plan notes found for this encounter.   Orders Placed This Encounter  Procedures  . US BREAST LTD UNI RIGHT INC AXILLA    Standing Status:   Future    Standing Expiration Date:   02/02/2020    Order Specific Question:   Reason for Exam (SYMPTOM  OR DIAGNOSIS REQUIRED)    Answer:   right chest wall nodule at incision, rule out recurrence    Order Specific Question:   Preferred imaging location?    Answer:   [facility]   All questions were answered. The patient knows to call the clinic with any problems, questions or concerns. No barriers to learning was detected. I spent 20 minutes counseling the patient face to face. The total time spent in the appointment was 25 minutes and more than 50% was on counseling and review of test results     Truitt Merle, MD 12/04/2018   I, Joslyn Devon, am acting as scribe for Truitt Merle, MD.   I have reviewed the above documentation for  accuracy and completeness, and I agree with the above.

## 2018-12-04 ENCOUNTER — Inpatient Hospital Stay (HOSPITAL_BASED_OUTPATIENT_CLINIC_OR_DEPARTMENT_OTHER): Payer: 59 | Admitting: Hematology

## 2018-12-04 ENCOUNTER — Inpatient Hospital Stay: Payer: 59 | Attending: Hematology

## 2018-12-04 ENCOUNTER — Encounter: Payer: Self-pay | Admitting: Hematology

## 2018-12-04 VITALS — BP 114/62 | HR 84 | Temp 98.7°F | Resp 18 | Ht 65.0 in | Wt 172.3 lb

## 2018-12-04 DIAGNOSIS — Z9013 Acquired absence of bilateral breasts and nipples: Secondary | ICD-10-CM | POA: Insufficient documentation

## 2018-12-04 DIAGNOSIS — F419 Anxiety disorder, unspecified: Secondary | ICD-10-CM | POA: Diagnosis not present

## 2018-12-04 DIAGNOSIS — C773 Secondary and unspecified malignant neoplasm of axilla and upper limb lymph nodes: Secondary | ICD-10-CM | POA: Diagnosis not present

## 2018-12-04 DIAGNOSIS — T451X5S Adverse effect of antineoplastic and immunosuppressive drugs, sequela: Secondary | ICD-10-CM | POA: Diagnosis not present

## 2018-12-04 DIAGNOSIS — Z79810 Long term (current) use of selective estrogen receptor modulators (SERMs): Secondary | ICD-10-CM | POA: Insufficient documentation

## 2018-12-04 DIAGNOSIS — C50511 Malignant neoplasm of lower-outer quadrant of right female breast: Secondary | ICD-10-CM | POA: Diagnosis not present

## 2018-12-04 DIAGNOSIS — R61 Generalized hyperhidrosis: Secondary | ICD-10-CM | POA: Diagnosis not present

## 2018-12-04 DIAGNOSIS — R0609 Other forms of dyspnea: Secondary | ICD-10-CM

## 2018-12-04 DIAGNOSIS — G620 Drug-induced polyneuropathy: Secondary | ICD-10-CM

## 2018-12-04 DIAGNOSIS — Z79899 Other long term (current) drug therapy: Secondary | ICD-10-CM | POA: Diagnosis not present

## 2018-12-04 DIAGNOSIS — Z9221 Personal history of antineoplastic chemotherapy: Secondary | ICD-10-CM | POA: Insufficient documentation

## 2018-12-04 DIAGNOSIS — Z170 Estrogen receptor positive status [ER+]: Secondary | ICD-10-CM | POA: Insufficient documentation

## 2018-12-04 DIAGNOSIS — R232 Flushing: Secondary | ICD-10-CM | POA: Diagnosis not present

## 2018-12-04 DIAGNOSIS — Z923 Personal history of irradiation: Secondary | ICD-10-CM | POA: Diagnosis not present

## 2018-12-04 DIAGNOSIS — G4700 Insomnia, unspecified: Secondary | ICD-10-CM | POA: Diagnosis not present

## 2018-12-04 DIAGNOSIS — M25551 Pain in right hip: Secondary | ICD-10-CM | POA: Diagnosis not present

## 2018-12-04 DIAGNOSIS — M25552 Pain in left hip: Secondary | ICD-10-CM | POA: Diagnosis not present

## 2018-12-04 DIAGNOSIS — Z791 Long term (current) use of non-steroidal anti-inflammatories (NSAID): Secondary | ICD-10-CM | POA: Insufficient documentation

## 2018-12-04 DIAGNOSIS — M1990 Unspecified osteoarthritis, unspecified site: Secondary | ICD-10-CM

## 2018-12-04 LAB — COMPREHENSIVE METABOLIC PANEL
ALBUMIN: 4 g/dL (ref 3.5–5.0)
ALK PHOS: 94 U/L (ref 38–126)
ALT: 16 U/L (ref 0–44)
ANION GAP: 8 (ref 5–15)
AST: 19 U/L (ref 15–41)
BILIRUBIN TOTAL: 0.3 mg/dL (ref 0.3–1.2)
BUN: 9 mg/dL (ref 6–20)
CHLORIDE: 103 mmol/L (ref 98–111)
CO2: 27 mmol/L (ref 22–32)
Calcium: 9.6 mg/dL (ref 8.9–10.3)
Creatinine, Ser: 0.76 mg/dL (ref 0.44–1.00)
GFR calc Af Amer: >60 mL/min (ref >60)
GFR calc non Af Amer: >60 mL/min (ref >60)
Glucose, Bld: 113 mg/dL — ABNORMAL HIGH (ref 70–99)
POTASSIUM: 4.1 mmol/L (ref 3.5–5.1)
Sodium: 138 mmol/L (ref 135–145)
Total Protein: 8.2 g/dL — ABNORMAL HIGH (ref 6.5–8.1)

## 2018-12-04 LAB — CBC WITH DIFFERENTIAL/PLATELET
ABS IMMATURE GRANULOCYTES: 0.01 10*3/uL (ref 0.00–0.07)
BASOS ABS: 0 10*3/uL (ref 0.0–0.1)
Basophils Relative: 0 %
Eosinophils Absolute: 0.1 10*3/uL (ref 0.0–0.5)
Eosinophils Relative: 2 %
HCT: 41.1 % (ref 36.0–46.0)
HEMOGLOBIN: 13.6 g/dL (ref 12.0–15.0)
IMMATURE GRANULOCYTES: 0 %
LYMPHS PCT: 21 %
Lymphs Abs: 1.1 10*3/uL (ref 0.7–4.0)
MCH: 29.5 pg (ref 26.0–34.0)
MCHC: 33.1 g/dL (ref 30.0–36.0)
MCV: 89.2 fL (ref 80.0–100.0)
Monocytes Absolute: 0.4 10*3/uL (ref 0.1–1.0)
Monocytes Relative: 7 %
NEUTROS ABS: 3.9 10*3/uL (ref 1.7–7.7)
NEUTROS PCT: 70 %
NRBC: 0 % (ref 0.0–0.2)
Platelets: 234 10*3/uL (ref 150–400)
RBC: 4.61 MIL/uL (ref 3.87–5.11)
RDW: 13.9 % (ref 11.5–15.5)
WBC: 5.5 10*3/uL (ref 4.0–10.5)

## 2018-12-04 MED ORDER — ZOLPIDEM TARTRATE 10 MG PO TABS: 10.0000 mg | ORAL_TABLET | Freq: Every evening | ORAL | 1 refills | Status: DC | PRN

## 2018-12-04 MED ORDER — GABAPENTIN 300 MG PO CAPS: 600.0000 mg | ORAL_CAPSULE | Freq: Every day | ORAL | 1 refills | Status: DC

## 2018-12-04 NOTE — Addendum Note (Signed)
Addended by: Truitt Merle on: 12/04/2018 05:42 PM   Modules accepted: Orders

## 2018-12-07 ENCOUNTER — Telehealth: Payer: Self-pay | Admitting: Hematology

## 2018-12-07 ENCOUNTER — Encounter: Payer: Self-pay | Admitting: Hematology

## 2018-12-07 NOTE — Telephone Encounter (Signed)
Printed and mailed calendar. °

## 2018-12-14 ENCOUNTER — Telehealth: Payer: Self-pay

## 2018-12-14 NOTE — Telephone Encounter (Signed)
New message    Just an FYI. We have made several attempts to contact this patient including sending a letter to schedule or reschedule their echocardiogram. We will be removing the patient from the echo WQ.   Thank you 

## 2018-12-14 NOTE — Telephone Encounter (Signed)
April Orozco, could you try to call her tomorrow? Please document in Epic if we can not get hold of her. Thanks   Truitt Merle MD

## 2018-12-15 ENCOUNTER — Telehealth: Payer: Self-pay

## 2018-12-15 NOTE — Telephone Encounter (Signed)
Left message for patient explaining Dr. Burr Medico would like to get the echocardiogram done, requested she call me back with her availability.

## 2018-12-24 ENCOUNTER — Ambulatory Visit
Admission: RE | Admit: 2018-12-24 | Discharge: 2018-12-24 | Disposition: A | Discharge status: Home / Self Care | Payer: 59 | Source: Ambulatory Visit | Attending: Hematology | Admitting: Hematology

## 2018-12-24 ENCOUNTER — Other Ambulatory Visit: Payer: Self-pay | Admitting: Hematology

## 2018-12-24 DIAGNOSIS — C50511 Malignant neoplasm of lower-outer quadrant of right female breast: Secondary | ICD-10-CM

## 2018-12-24 DIAGNOSIS — Z170 Estrogen receptor positive status [ER+]: Principal | ICD-10-CM

## 2019-03-12 ENCOUNTER — Other Ambulatory Visit: Payer: Self-pay | Admitting: Hematology

## 2019-03-12 DIAGNOSIS — C50511 Malignant neoplasm of lower-outer quadrant of right female breast: Secondary | ICD-10-CM

## 2019-03-12 DIAGNOSIS — Z170 Estrogen receptor positive status [ER+]: Principal | ICD-10-CM

## 2019-03-12 DIAGNOSIS — G4700 Insomnia, unspecified: Secondary | ICD-10-CM

## 2019-03-12 DIAGNOSIS — F419 Anxiety disorder, unspecified: Secondary | ICD-10-CM

## 2019-03-15 ENCOUNTER — Other Ambulatory Visit: Payer: Self-pay | Admitting: Nurse Practitioner

## 2019-03-15 DIAGNOSIS — G4700 Insomnia, unspecified: Secondary | ICD-10-CM

## 2019-03-15 DIAGNOSIS — F419 Anxiety disorder, unspecified: Secondary | ICD-10-CM

## 2019-03-15 DIAGNOSIS — Z170 Estrogen receptor positive status [ER+]: Principal | ICD-10-CM

## 2019-03-15 DIAGNOSIS — C50511 Malignant neoplasm of lower-outer quadrant of right female breast: Secondary | ICD-10-CM

## 2019-03-15 MED ORDER — ZOLPIDEM TARTRATE 10 MG PO TABS: 10.0000 mg | ORAL_TABLET | Freq: Every evening | ORAL | 1 refills | Status: DC | PRN

## 2019-04-05 ENCOUNTER — Telehealth: Payer: Self-pay | Admitting: Hematology

## 2019-04-05 NOTE — Telephone Encounter (Signed)
Called patient regarding upcoming Web[facility] appointment, patient would like to cancel 05/13 appointment until patient can meet with provider face to face. Patient will call back when ready to reschedule, appointment has been cancelled.   Message to provider.

## 2019-04-07 ENCOUNTER — Inpatient Hospital Stay: Payer: 59 | Admitting: Hematology

## 2019-04-07 ENCOUNTER — Other Ambulatory Visit: Payer: 59

## 2019-07-12 ENCOUNTER — Other Ambulatory Visit: Payer: Self-pay | Admitting: Hematology

## 2019-07-13 NOTE — Telephone Encounter (Signed)
See refill request.

## 2019-07-20 ENCOUNTER — Telehealth: Payer: Self-pay | Admitting: *Deleted

## 2019-07-20 NOTE — Telephone Encounter (Signed)
Per Regan Rakers NP, called pt about scheduling for routine visit. Pt stated "I can come any day and time in November." Message sent to scheduling for appt.

## 2019-07-20 NOTE — Telephone Encounter (Signed)
-----   Message from Alla Feeling, NP sent at 07/20/2019  2:06 PM EDT ----- Please call and see how she is doing. She postponed a visit this Spring due to covid. Please offer lab, f/u with me or Dr. Burr Medico in next few weeks if she agrees, not urgent.  Thanks, Regan Rakers

## 2019-07-21 ENCOUNTER — Telehealth: Payer: Self-pay | Admitting: Hematology

## 2019-07-21 NOTE — Telephone Encounter (Signed)
Scheduled appt per 8/25 sch message - unable to reach pt . Left message with appt date and time

## 2019-10-06 NOTE — Progress Notes (Signed)
[place]   [phone] [phone]   Clinic Follow up Note   Patient Care Team: Patient, No Pcp Per as PCP - General (General Practice) Rolm Bookbinder, MD as Consulting Physician (General Surgery) Truitt Merle, MD as Consulting Physician (Hematology) Kyung Rudd, MD as Consulting Physician (Radiation Oncology) Delice Bison, Charlestine Massed, NP as Nurse Practitioner (Hematology and Oncology)  Date of Service:  10/11/2019  CHIEF COMPLAINT: Follow up right breast cancer  SUMMARY OF ONCOLOGIC HISTORY: Oncology History Overview Note  Cancer Staging Malignant neoplasm of lower-outer quadrant of right female breast [facility]) Staging form: Breast, AJCC 7th Edition - Clinical stage from 09/12/2016: Stage IIB (T2, N1, M0) - Signed by Truitt Merle, MD on 09/18/2016 - Pathologic stage from 04/07/2017: Stage IIB (T2, N1a, cM0) - Signed by Truitt Merle, MD on 05/10/2017     Malignant neoplasm of lower-outer quadrant of right female breast ([place])  09/10/2016 Mammogram   Mammogram and ultrasound showed a 2.7 cm mass at 8 clock position of the right breast, there is also a lobulated lymph nodes in the right axilla, slightly suspicious for malignancy.    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 Docetaxel 68m/m2 on 01/24/17, was to be given every 3 weeks but stopped due to worsening  neuropathy (only 1 cycle given)   11/20/2016 Genetic Testing   Negative genetic testing on the Breast/Ovarian cancer panel.  Negative genetic testing for the MSH2 inversion analysis ([place] inversion). The Breast/Ovarian gene panel offered by [facility] includes sequencing and rearrangement analysis for the following 20 genes:  ATM, BARD1, BRCA1, BRCA2, BRIP1, CDH1, CHEK2, EPCAM, FANCC, MLH1, MSH2, MSH6, NBN, PALB2, PMS2, PTEN, RAD51C, RAD51D, TP53, and XRCC2.   The report date is December 19 [facility] and November 20, 2016 for the [facility].   02/14/2017 -  Anti-estrogen oral therapy   Tamoxifen 20 mg daily starting 02/14/17, held since surgery on 04/07/2017, restart 05/22/17   02/22/2017 Breast MRI   IMPRESSION: 1. Significantly smaller mass within the lower outer quadrant of the right breast, now measuring 1.5 cm (previously 2.7 cm). 2. Smaller right axillary lymph nodes.   04/07/2017 Surgery   RIGHT SKIN SPARING MASTECTOMY WITH RADIOACTIVE SEED GUIDED AXILLARY LYMPH NODE EXCISION AND AXILLARY SENTINEL LYMPH NODE BIOPSY, LEFT PROPHYLACTIC SKIN SPARING MASTECTOMY by Dr. WDonne Hazel   04/07/2017 Pathology Results   Diagnosis 04/07/17 1. Breast, simple mastectomy, Left Prophylactic Skin Sparing - FIBROCYSTIC CHANGES WITH SCLEROSING ADENOSIS AND CALCIFICATIONS. - LOBULAR NEOPLASIA (ATYPICAL LOBULAR HYPERPLASIA). 2. Breast, simple mastectomy, Right Skin Sparing - INVASIVE AND IN SITU DUCTAL CARCINOMA, 2.1 CM. - MARGINS NOT INVOLVED. - FIBROCYSTIC CHANGES WITH CALCIFICATIONS AND FOCAL ATYPICAL DUCTAL HYPERPLASIA. - METASTATIC CARCINOMA IN ONE LYMPH NODE WITH ASSOCIATED BIOPSY CAVITY (1/1). 3. Lymph nodes, regional resection, Right Axillary - METASTATIC CARCINOMA IN TWO OF SIX LYMPH NODES (2/6).   04/23/2017 Surgery   RIGHT AXILLARY LYMPH NODE DISSECTION  by Dr. Donne Hazel    04/23/2017 Pathology Results   Diagnosis 04/23/17 Lymph nodes, regional resection, Right Axillary Contents - NO  CARCINOMA IDENTIFIED IN EIGHT LYMPH NODES (0/8)     06/03/2017 Pathology Results   Skin right breast mastectomy and wound with results of: Skin with ulceration, acute suppurative inflammation and foci of suture material with giant cell reaction. No evidence of malignancy.    08/11/2017 - 10/07/2017 Radiation Therapy   Adjuvant breast and axilla radiation    02/05/2018 Imaging   Whole Body Bone Scan 02/05/18 IMPRESSION: 1. Focal increased radiotracer uptake in the right greater trochanter and left superior acetabular regions. Advise pelvic radiographic examination to further evaluate these areas for possible metastatic foci. It is possible that these areas show abnormal uptake due to asymmetric areas of arthropathy. 2. Probable arthropathy in the shoulders, elbows, and knees. Increased uptake of radiotracer in the mid face is likely due to paranasal sinus disease. 3. Increased radiotracer uptake throughout much of the left breast may be a nonspecific finding. This finding also may be seen due to breast carcinoma. Appropriate assessment of the left breast in this regard advised.   02/12/2018 Imaging   MRI Pelvis IMPRESSION: 1. 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 Finding is not suggestive of metastatic disease. 2. Focal tendinopathy of the distal right gluteal tendons at the insertion on the right greater trochanter.      CURRENT THERAPY:  Tamoxifen 20 mg daily started 02/14/17, held since 12/24/2017, restart 02/06/2018, she stopped in the summer of 2020 due to poor tolerance   INTERVAL HISTORY:  April Orozco is here for a follow up of right breast cancer. She presents to the clinic alone. She was last seen in Jan 2020, and has not followed [place] as planned due to the [place] pandemic.  Due to the worsening hot flashes, and joint pain, on top of her severe neuropathy, she has stopped the  tamoxifen in the summer 2020.  Her neuropathy is mainly in her feet and toes, she is on tamoxifen 600 mg at night, due to complaints of severe burning and tingling, especially after long time standing or walking.  She is able to do her job, and continue the daily activities.  She ran out of Ambien several months ago, and has been taking Benadryl 3 tablets at night for insomnia.  She also noticed intermittent dyspnea, and mild dry cough in the past 3 to 4 months.  No chest pain, leg swelling or other new complaints.  She never smoked, no sputum production.  She states her anxiety and depression has much improved, she has not been taking Xanax for long time.  She recently changed her diet, has cut back her carbohydrate, also not intentional, she did lose about 20 pounds since January 2020.  Review of systems otherwise negative.   MEDICAL HISTORY:  Past Medical History:  Diagnosis Date  . Fracture, sacrum/coccyx ([place]) 1970   "Shattered tailbone"  . Heart murmur    [facility] has evaluated, felt benign  . Malignant neoplasm of lower-outer quadrant of right female breast ([place]) 09/13/2016  . Tachycardia    Intermittent, resolved with decreased caffeine intake    SURGICAL HISTORY: Past Surgical History:  Procedure Laterality Date  . APPENDECTOMY    . AXILLARY LYMPH NODE DISSECTION Right 04/23/2017   Procedure: RIGHT AXILLARY LYMPH NODE DISSECTION;  Surgeon: Rolm Bookbinder, MD;  Location: [place];  Service: General;  Laterality: Right;  . BREAST LUMPECTOMY WITH AXILLARY LYMPH NODE BIOPSY Right 04/23/2017  . BREAST RECONSTRUCTION WITH PLACEMENT OF TISSUE EXPANDER AND FLEX HD (ACELLULAR HYDRATED DERMIS) Bilateral 04/07/2017   Procedure: BILATERAL BREAST RECONSTRUCTION WITH PLACEMENT OF TISSUE EXPANDER AND FLEX HD (ACELLULAR HYDRATED DERMIS);  Surgeon: Crissie Reese, MD;  Location: [place];  Service: Plastics;  Laterality: Bilateral;  . IR GENERIC HISTORICAL  01/10/2017   IR CV LINE INJECTION  01/10/2017 Greggory Keen, MD [facility]-INTERV RAD  . MASTECTOMY    . MASTECTOMY WITH RADIOACTIVE SEED GUIDED EXCISION AND AXILLARY SENTINEL LYMPH NODE BIOPSY Bilateral 04/07/2017   Procedure: RIGHT SKIN SPARING MASTECTOMY WITH RADIOACTIVE SEED GUIDED AXILLARY LYMPH NODE EXCISION AND AXILLARY SENTINEL LYMPH NODE BIOPSY, LEFT PROPHYLACTIC SKIN SPARING MASTECTOMY;  Surgeon: Rolm Bookbinder, MD;  Location: [place];  Service: General;  Laterality: Bilateral;  . PORT-A-CATH REMOVAL N/A 04/07/2017   Procedure: REMOVAL PORT-A-CATH;  Surgeon: Rolm Bookbinder, MD;  Location: [place];  Service: General;  Laterality: N/A;  . PORTACATH PLACEMENT Right 09/23/2016   Procedure: INSERTION PORT-A-CATH WITH [place];  Surgeon: Rolm Bookbinder, MD;  Location: [place];  Service: General;  Laterality: Right;  . TONSILECTOMY/ADENOIDECTOMY WITH MYRINGOTOMY    . TUBAL LIGATION      I have reviewed the social history and family history with the patient and they are unchanged from previous note.  ALLERGIES:  is allergic to banana; sulfur; and tramadol.  MEDICATIONS:  Current Outpatient Medications  Medication Sig Dispense Refill  . Folic Acid-Vit H6-PRF F63 (FOLBEE) 2.5-25-1 MG TABS tablet Take 1 tablet by mouth daily.    Marland Kitchen gabapentin (NEURONTIN) 300 MG capsule TAKE TWO CAPSULES BY MOUTH AT BEDTIME 180 capsule 1  . ibuprofen (ADVIL,MOTRIN) 200 MG tablet Take 800 mg by mouth every 6 (six) hours as needed for headache, mild pain or moderate pain.    Marland Kitchen exemestane (AROMASIN) 25 MG tablet Take 1 tablet (25 mg total) by mouth daily after breakfast. 30 tablet 2  . tamoxifen (NOLVADEX) 20 MG tablet Take 1 tablet (20 mg total) by mouth daily. (Patient not taking: Reported on 10/11/2019) 30 tablet 3  . zolpidem (AMBIEN) 5 MG tablet Take 1 tablet (5 mg total) by mouth at bedtime as needed for sleep. 30 tablet 2   No current facility-administered medications for this visit.     PHYSICAL EXAMINATION: ECOG PERFORMANCE  STATUS: 1 - Symptomatic but completely ambulatory  Vitals:   10/11/19 1427  BP: 138/62  Pulse: 88  Resp: 17  Temp: 98 F (36.7 C)  SpO2: 100%   Filed Weights   10/11/19 1427  Weight: 154 lb 1.6 oz (69.9 kg)   GENERAL:alert, no distress and comfortable SKIN: skin color, texture, turgor are normal, no rashes or significant lesions EYES: normal, Conjunctiva are pink and non-injected, sclera clear NECK: supple, thyroid normal size, non-tender, without nodularity LYMPH:  no palpable lymphadenopathy in the cervical, axillary  LUNGS: clear to auscultation and percussion with normal breathing effort HEART: regular rate & rhythm and no murmurs and no lower extremity edema ABDOMEN:abdomen soft, non-tender and normal bowel sounds Musculoskeletal:no cyanosis of digits and no clubbing  NEURO: alert & oriented x 3 with fluent speech, no focal motor/sensory deficits  LABORATORY DATA:  I have reviewed the data as listed CBC Latest Ref Rng & Units 10/11/2019 12/04/2018 04/02/2018  WBC 4.0 - 10.5 K/uL 5.8 5.5 6.0  Hemoglobin 12.0 - 15.0 g/dL 12.8 13.6 13.2  Hematocrit 36.0 - 46.0 % 38.9 41.1  39.5  Platelets 150 - 400 K/uL 241 234 215     CMP Latest Ref Rng & Units 10/11/2019 12/04/2018 04/02/2018  Glucose 70 - 99 mg/dL 109(H) 113(H) 134  BUN 6 - 20 mg/dL _0 Creatinine 0.44 - 1.00 mg/dL 0.76 0.76 0.80  Sodium 135 - 145 mmol/L 141 138 140  Potassium 3.5 - 5.1 mmol/L 3.5 4.1 4.0  Chloride 98 - 111 mmol/L 102 103 104  CO2 22 - 32 mmol/L _1 Calcium 8.9 - 10.3 mg/dL 9.3 9.6 9.8  Total Protein 6.5 - 8.1 g/dL 8.3(H) 8.2(H) 7.9  Total Bilirubin 0.3 - 1.2 mg/dL 0.2(L) 0.3 0.2  Alkaline Phos 38 - 126 U/L 101 94 89  AST 15 - 41 U/L _2 ALT 0 - 44 U/L _3 RADIOGRAPHIC STUDIES: I have personally reviewed the radiological images as listed and agreed with the findings in the report. No results found.   ASSESSMENT & PLAN:  April Orozco is a 53 y.o. female with   1.  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 She is s/p neoadjuvant chemo with AC-T, B/l mastectomy and right axillary LN dissection and adjuvant radiation.  -She has been on antiestrogen therapy with Tamoxifen since 01/2017, but not very compliant due to poor tolerance.  She has stopped on her own to 3 months ago due to severe arthralgia and hot flashes. -Due to her dry cough and intermittent dyspnea for the past 3 to 4 months, and significant weight loss, CT chest to rule out evidence. -She is recently seen by her gynecologist, and her hormone test showed that she is postmenopausal now. -I again discussed her high risk of recurrence due to her node positive disease, and encouraged her to continue antiestrogen therapy.  Since she is postmenopausal, we could consider aromatase inhibitor.  Due to her arthralgia, I recommend her to try exemestane.  Potential side effects discussed with her, she agrees to try.  I will let her start at half dose 12.5 mg daily, for the first months, to see if she tolerates.  If she does, will increase to full dose 25 mg daily from second month -She has had a bilateral mastectomy, no need routine screening mammogram -She is not happy with the implant in the left breast, she was referred by Dr. Donne Hazel to a new plastic surgeon, and plan to have FLAP -f/u in 3 months    2. Genetics, Testing was negative   3. Anxiety and insomnia -Overall improved since she completed major breast cancer treatment -she is not on Xanax any more  -She still has severe insomnia, takes Benadryl 3 tablets every night, I referred her Ambien today, and reduce her dose to 5 mg.  I encouraged her to use only as needed, to decrease the risk of dependence.  4. Neuropathy, G2 -Chemotherapy induced.  -Numbness and tingling of her fingers and toes, overall improved -Controlled  with Gabapentin. She will continue gabapentin at 6106m at night   5. Hot flash and night sweats -Effexor was not effective. She will continue gabapentin at night -Much improved since she came off tamoxifen  6. Dry cough and remittent dyspnea -Exam was unremarkable today -We will get a CT chest with contrast to rule out cancer recurrence    Plan  -she has stopped tamoxifen on her own due  to poor tolerance -I called in exemestane to her pharmacy today, she agrees to try, will start at 12.5 mg daily for first months, if she tolerates well, will increase to 25 mg daily from second months -CT chest with contrast in the next 2 weeks, I will call her with the results -Lab and follow-up in 3 months    No problem-specific Assessment & Plan notes found for this encounter.   Orders Placed This Encounter  Procedures  . CT Chest W Contrast    Cough for 3-4 months    Standing Status:   Future    Standing Expiration Date:   10/10/2020    Order Specific Question:   If indicated for the ordered procedure, I authorize the administration of contrast media per Radiology protocol    Answer:   Yes    Order Specific Question:   Is patient pregnant?    Answer:   No    Order Specific Question:   Preferred imaging location?    Answer:   [facility]    Order Specific Question:   Radiology Contrast Protocol - do NOT remove file path    Answer:   [email or web address]   All questions were answered. The patient knows to call the clinic with any problems, questions or concerns. No barriers to learning was detected. I spent 25 minutes counseling the patient face to face. The total time spent in the appointment was 30 minutes and more than 50% was on counseling and review of test results     Truitt Merle, MD 10/11/2019   I, Joslyn Devon, am acting as scribe for Truitt Merle, MD.   I have reviewed the above documentation for accuracy and completeness, and I agree with the  above.

## 2019-10-11 ENCOUNTER — Encounter: Payer: Self-pay | Admitting: Hematology

## 2019-10-11 ENCOUNTER — Inpatient Hospital Stay: Payer: BC Managed Care – PPO

## 2019-10-11 ENCOUNTER — Other Ambulatory Visit: Payer: Self-pay

## 2019-10-11 ENCOUNTER — Inpatient Hospital Stay: Payer: BC Managed Care – PPO | Attending: Hematology | Admitting: Hematology

## 2019-10-11 VITALS — BP 138/62 | HR 88 | Temp 98.0°F | Resp 17 | Ht 65.0 in | Wt 154.1 lb

## 2019-10-11 DIAGNOSIS — C50511 Malignant neoplasm of lower-outer quadrant of right female breast: Secondary | ICD-10-CM | POA: Insufficient documentation

## 2019-10-11 DIAGNOSIS — R232 Flushing: Secondary | ICD-10-CM | POA: Diagnosis not present

## 2019-10-11 DIAGNOSIS — Z79810 Long term (current) use of selective estrogen receptor modulators (SERMs): Secondary | ICD-10-CM | POA: Diagnosis not present

## 2019-10-11 DIAGNOSIS — R0600 Dyspnea, unspecified: Secondary | ICD-10-CM | POA: Diagnosis not present

## 2019-10-11 DIAGNOSIS — F329 Major depressive disorder, single episode, unspecified: Secondary | ICD-10-CM | POA: Diagnosis not present

## 2019-10-11 DIAGNOSIS — G4700 Insomnia, unspecified: Secondary | ICD-10-CM | POA: Diagnosis not present

## 2019-10-11 DIAGNOSIS — C773 Secondary and unspecified malignant neoplasm of axilla and upper limb lymph nodes: Secondary | ICD-10-CM | POA: Diagnosis not present

## 2019-10-11 DIAGNOSIS — F419 Anxiety disorder, unspecified: Secondary | ICD-10-CM | POA: Insufficient documentation

## 2019-10-11 DIAGNOSIS — G629 Polyneuropathy, unspecified: Secondary | ICD-10-CM | POA: Insufficient documentation

## 2019-10-11 DIAGNOSIS — Z170 Estrogen receptor positive status [ER+]: Secondary | ICD-10-CM | POA: Diagnosis not present

## 2019-10-11 DIAGNOSIS — Z9221 Personal history of antineoplastic chemotherapy: Secondary | ICD-10-CM | POA: Diagnosis not present

## 2019-10-11 DIAGNOSIS — R05 Cough: Secondary | ICD-10-CM | POA: Diagnosis not present

## 2019-10-11 DIAGNOSIS — R61 Generalized hyperhidrosis: Secondary | ICD-10-CM | POA: Diagnosis not present

## 2019-10-11 DIAGNOSIS — Z923 Personal history of irradiation: Secondary | ICD-10-CM | POA: Diagnosis not present

## 2019-10-11 DIAGNOSIS — Z791 Long term (current) use of non-steroidal anti-inflammatories (NSAID): Secondary | ICD-10-CM | POA: Diagnosis not present

## 2019-10-11 DIAGNOSIS — Z79899 Other long term (current) drug therapy: Secondary | ICD-10-CM | POA: Diagnosis not present

## 2019-10-11 DIAGNOSIS — Z9013 Acquired absence of bilateral breasts and nipples: Secondary | ICD-10-CM | POA: Diagnosis not present

## 2019-10-11 LAB — CBC WITH DIFFERENTIAL/PLATELET
Abs Immature Granulocytes: 0.01 10*3/uL (ref 0.00–0.07)
Basophils Absolute: 0 10*3/uL (ref 0.0–0.1)
Basophils Relative: 0 %
Eosinophils Absolute: 0.1 10*3/uL (ref 0.0–0.5)
Eosinophils Relative: 1 %
HCT: 38.9 % (ref 36.0–46.0)
Hemoglobin: 12.8 g/dL (ref 12.0–15.0)
Immature Granulocytes: 0 %
Lymphocytes Relative: 23 %
Lymphs Abs: 1.3 10*3/uL (ref 0.7–4.0)
MCH: 29.4 pg (ref 26.0–34.0)
MCHC: 32.9 g/dL (ref 30.0–36.0)
MCV: 89.2 fL (ref 80.0–100.0)
Monocytes Absolute: 0.4 10*3/uL (ref 0.1–1.0)
Monocytes Relative: 6 %
Neutro Abs: 4.1 10*3/uL (ref 1.7–7.7)
Neutrophils Relative %: 70 %
Platelets: 241 10*3/uL (ref 150–400)
RBC: 4.36 MIL/uL (ref 3.87–5.11)
RDW: 14.3 % (ref 11.5–15.5)
WBC: 5.8 10*3/uL (ref 4.0–10.5)
nRBC: 0 % (ref 0.0–0.2)

## 2019-10-11 LAB — COMPREHENSIVE METABOLIC PANEL
ALT: 19 U/L (ref 0–44)
AST: 20 U/L (ref 15–41)
Albumin: 4 g/dL (ref 3.5–5.0)
Alkaline Phosphatase: 101 U/L (ref 38–126)
Anion gap: 10 (ref 5–15)
BUN: 10 mg/dL (ref 6–20)
CO2: 29 mmol/L (ref 22–32)
Calcium: 9.3 mg/dL (ref 8.9–10.3)
Chloride: 102 mmol/L (ref 98–111)
Creatinine, Ser: 0.76 mg/dL (ref 0.44–1.00)
GFR calc Af Amer: >60 mL/min (ref >60)
GFR calc non Af Amer: >60 mL/min (ref >60)
Glucose, Bld: 109 mg/dL — ABNORMAL HIGH (ref 70–99)
Potassium: 3.5 mmol/L (ref 3.5–5.1)
Sodium: 141 mmol/L (ref 135–145)
Total Bilirubin: 0.2 mg/dL — ABNORMAL LOW (ref 0.3–1.2)
Total Protein: 8.3 g/dL — ABNORMAL HIGH (ref 6.5–8.1)

## 2019-10-11 MED ORDER — ZOLPIDEM TARTRATE 5 MG PO TABS: 5.0000 mg | ORAL_TABLET | Freq: Every evening | ORAL | 2 refills | Status: DC | PRN

## 2019-10-11 MED ORDER — EXEMESTANE 25 MG PO TABS: 25.0000 mg | ORAL_TABLET | Freq: Every day | ORAL | 2 refills | Status: DC

## 2019-10-12 ENCOUNTER — Telehealth: Payer: Self-pay | Admitting: Hematology

## 2019-10-12 DIAGNOSIS — Z01419 Encounter for gynecological examination (general) (routine) without abnormal findings: Secondary | ICD-10-CM | POA: Diagnosis not present

## 2019-10-12 DIAGNOSIS — Z124 Encounter for screening for malignant neoplasm of cervix: Secondary | ICD-10-CM | POA: Diagnosis not present

## 2019-10-12 DIAGNOSIS — Z6829 Body mass index (BMI) 29.0-29.9, adult: Secondary | ICD-10-CM | POA: Diagnosis not present

## 2019-10-12 DIAGNOSIS — Z1151 Encounter for screening for human papillomavirus (HPV): Secondary | ICD-10-CM | POA: Diagnosis not present

## 2019-10-12 NOTE — Telephone Encounter (Signed)
Scheduled appt per 11/16 los.  Spoke with pt and they are aware of the appt date and time.

## 2019-10-27 ENCOUNTER — Encounter (HOSPITAL_COMMUNITY): Payer: Self-pay

## 2019-10-27 ENCOUNTER — Ambulatory Visit (HOSPITAL_COMMUNITY)
Admission: RE | Admit: 2019-10-27 | Discharge: 2019-10-27 | Disposition: A | Discharge status: Home / Self Care | Payer: BC Managed Care – PPO | Source: Ambulatory Visit | Attending: Hematology | Admitting: Hematology

## 2019-10-27 ENCOUNTER — Other Ambulatory Visit: Payer: Self-pay

## 2019-10-27 DIAGNOSIS — Z170 Estrogen receptor positive status [ER+]: Secondary | ICD-10-CM | POA: Insufficient documentation

## 2019-10-27 DIAGNOSIS — C50911 Malignant neoplasm of unspecified site of right female breast: Secondary | ICD-10-CM | POA: Diagnosis not present

## 2019-10-27 DIAGNOSIS — C50511 Malignant neoplasm of lower-outer quadrant of right female breast: Secondary | ICD-10-CM | POA: Insufficient documentation

## 2019-10-27 MED ORDER — SODIUM CHLORIDE (PF) 0.9 % IJ SOLN
INTRAMUSCULAR | Status: AC
  Filled 2019-10-27: qty 50

## 2019-10-27 MED ORDER — IOHEXOL 300 MG/ML  SOLN
75.0000 mL | Freq: Once | INTRAMUSCULAR | Status: AC | PRN
  Administered 2019-10-27: 13:00:00 75 mL via INTRAVENOUS

## 2019-10-28 ENCOUNTER — Other Ambulatory Visit: Payer: Self-pay | Admitting: Hematology

## 2019-10-28 ENCOUNTER — Encounter: Payer: Self-pay | Admitting: Hematology

## 2019-10-28 DIAGNOSIS — Z170 Estrogen receptor positive status [ER+]: Secondary | ICD-10-CM

## 2019-11-09 ENCOUNTER — Telehealth: Payer: Self-pay

## 2019-11-09 NOTE — Telephone Encounter (Signed)
Opened in error

## 2019-11-11 ENCOUNTER — Ambulatory Visit (HOSPITAL_COMMUNITY): Payer: BC Managed Care – PPO

## 2019-11-17 ENCOUNTER — Ambulatory Visit (HOSPITAL_COMMUNITY)
Admission: RE | Admit: 2019-11-17 | Discharge: 2019-11-17 | Disposition: A | Discharge status: Home / Self Care | Payer: BC Managed Care – PPO | Source: Ambulatory Visit | Attending: Hematology | Admitting: Hematology

## 2019-11-17 ENCOUNTER — Ambulatory Visit (HOSPITAL_COMMUNITY): Payer: BC Managed Care – PPO

## 2019-11-17 ENCOUNTER — Other Ambulatory Visit: Payer: Self-pay

## 2019-11-17 DIAGNOSIS — C50911 Malignant neoplasm of unspecified site of right female breast: Secondary | ICD-10-CM | POA: Diagnosis not present

## 2019-11-17 DIAGNOSIS — C50511 Malignant neoplasm of lower-outer quadrant of right female breast: Secondary | ICD-10-CM | POA: Diagnosis not present

## 2019-11-17 DIAGNOSIS — Z170 Estrogen receptor positive status [ER+]: Secondary | ICD-10-CM | POA: Insufficient documentation

## 2019-11-17 LAB — GLUCOSE, CAPILLARY: Glucose-Capillary: 107 mg/dL — ABNORMAL HIGH (ref 70–99)

## 2019-11-17 MED ORDER — FLUDEOXYGLUCOSE F - 18 (FDG) INJECTION
8.0000 | Freq: Once | INTRAVENOUS | Status: AC
  Administered 2019-11-17: 8 via INTRAVENOUS

## 2019-11-22 ENCOUNTER — Inpatient Hospital Stay: Payer: BC Managed Care – PPO | Attending: Hematology | Admitting: Hematology

## 2019-11-22 ENCOUNTER — Ambulatory Visit (HOSPITAL_COMMUNITY): Payer: BC Managed Care – PPO

## 2019-11-22 DIAGNOSIS — Z170 Estrogen receptor positive status [ER+]: Secondary | ICD-10-CM

## 2019-11-22 DIAGNOSIS — C50511 Malignant neoplasm of lower-outer quadrant of right female breast: Secondary | ICD-10-CM | POA: Diagnosis not present

## 2019-11-22 NOTE — Progress Notes (Signed)
[name]   [phone] [phone]   Clinic Follow up Note   Patient Care Team: Patient, No Pcp Per as PCP - General (General Practice) Rolm Bookbinder, MD as Consulting Physician (General Surgery) Truitt Merle, MD as Consulting Physician (Hematology) Kyung Rudd, MD as Consulting Physician (Radiation Oncology) Delice Bison Charlestine Massed, NP as Nurse Practitioner (Hematology and Oncology)   I connected with April Orozco on 11/22/2019 at  4:00 PM EST by video enabled telemedicine visit and verified that I am speaking with the correct person using two identifiers.  I discussed the limitations, risks, security and privacy concerns of performing an evaluation and management service by telephone and the availability of in person appointments. I also discussed with the patient that there may be a patient responsible charge related to this service. The patient expressed understanding and agreed to proceed.    Patient's location:  Her car Provider's location:  My office  CHIEF COMPLAINT: Follow up right breast cancer  SUMMARY OF ONCOLOGIC HISTORY: Oncology History Overview Note  Cancer Staging Malignant neoplasm of lower-outer quadrant of right female breast [facility]) Staging form: Breast, AJCC 7th Edition - Clinical stage from 09/12/2016: Stage IIB (T2, N1, M0) - Signed by Truitt Merle, MD on 09/18/2016 - Pathologic stage from 04/07/2017: Stage IIB (T2, N1a, cM0) - Signed by Truitt Merle, MD on 05/10/2017     Malignant neoplasm of lower-outer quadrant of right female breast ([place])  09/10/2016 Mammogram   Mammogram and ultrasound showed a 2.7 cm mass at 8 clock position of the right breast, there is also a lobulated lymph nodes in the right axilla, slightly suspicious for malignancy.    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 Docetaxel 22m/m2 on 01/24/17, was to be given every 3 weeks but stopped due to worsening neuropathy (only 1 cycle given)   11/20/2016 Genetic Testing   Negative genetic testing on the Breast/Ovarian cancer panel.  Negative genetic testing for the MSH2 inversion analysis ([place] inversion). The Breast/Ovarian gene panel offered by [facility] includes sequencing and rearrangement analysis for the following 20 genes:  ATM, BARD1, BRCA1, BRCA2, BRIP1, CDH1, CHEK2, EPCAM, FANCC, MLH1, MSH2, MSH6, NBN, PALB2, PMS2, PTEN, RAD51C, RAD51D, TP53, and XRCC2.   The report date is December 19 for the [facility] and November 20, 2016 for the BLopenoinversion.   02/14/2017 -  Anti-estrogen oral therapy   Tamoxifen 20 mg daily starting 02/14/17, held since surgery on 04/07/2017, restart 05/22/17   02/22/2017 Breast MRI   IMPRESSION: 1. Significantly smaller mass within the lower outer quadrant of the right breast, now measuring 1.5 cm (previously 2.7 cm). 2. Smaller right axillary lymph nodes.   04/07/2017 Surgery   RIGHT SKIN SPARING MASTECTOMY WITH RADIOACTIVE SEED GUIDED AXILLARY LYMPH NODE EXCISION AND AXILLARY SENTINEL LYMPH NODE BIOPSY, LEFT PROPHYLACTIC SKIN SPARING MASTECTOMY by Dr. WDonne Hazel   04/07/2017 Pathology Results   Diagnosis 04/07/17  1. Breast, simple mastectomy, Left Prophylactic Skin Sparing - FIBROCYSTIC CHANGES WITH SCLEROSING ADENOSIS AND CALCIFICATIONS. - LOBULAR NEOPLASIA (ATYPICAL LOBULAR HYPERPLASIA). 2. Breast, simple  mastectomy, Right Skin Sparing - INVASIVE AND IN SITU DUCTAL CARCINOMA, 2.1 CM. - MARGINS NOT INVOLVED. - FIBROCYSTIC CHANGES WITH CALCIFICATIONS AND FOCAL ATYPICAL DUCTAL HYPERPLASIA. - METASTATIC CARCINOMA IN ONE LYMPH NODE WITH ASSOCIATED BIOPSY CAVITY (1/1). 3. Lymph nodes, regional resection, Right Axillary - METASTATIC CARCINOMA IN TWO OF SIX LYMPH NODES (2/6).   04/23/2017 Surgery   RIGHT AXILLARY LYMPH NODE DISSECTION by Dr. Donne Hazel    04/23/2017 Pathology Results   Diagnosis 04/23/17 Lymph nodes, regional resection, Right Axillary Contents - NO CARCINOMA IDENTIFIED IN EIGHT LYMPH NODES (0/8)     06/03/2017 Pathology Results   Skin right breast mastectomy and wound with results of: Skin with ulceration, acute suppurative inflammation and foci of suture material with giant cell reaction. No evidence of malignancy.    08/11/2017 - 10/07/2017 Radiation Therapy   Adjuvant breast and axilla radiation    02/05/2018 Imaging   Whole Body Bone Scan 02/05/18 IMPRESSION: 1. Focal increased radiotracer uptake in the right greater trochanter and left superior acetabular regions. Advise pelvic radiographic examination to further evaluate these areas for possible metastatic foci. It is possible that these areas show abnormal uptake due to asymmetric areas of arthropathy. 2. Probable arthropathy in the shoulders, elbows, and knees. Increased uptake of radiotracer in the mid face is likely due to paranasal sinus disease. 3. Increased radiotracer uptake throughout much of the left breast may be a nonspecific finding. This finding also may be seen due to breast carcinoma. Appropriate assessment of the left breast in this regard advised.   02/12/2018 Imaging   MRI Pelvis IMPRESSION: 1. 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 Finding is not suggestive of metastatic disease. 2. Focal tendinopathy  of the distal right gluteal tendons at the insertion on the right greater trochanter.   10/27/2019 Imaging   CT Chest with Contrast 10/27/19 IMPRESSION: 1. Interval development of asymmetric enlargement of left internal mammary lymph node. Cannot rule out metastatic adenopathy. Consider further investigation with PET-CT. 2. Emphysema and aortic atherosclerosis. Lad coronary artery calcifications noted.   Aortic Atherosclerosis (ICD10-I70.0) and Emphysema (ICD10-J43.9).   11/17/2019 PET scan   IMPRESSION: 1. 0.8 cm left internal mammary lymph node has a maximum SUV of 5.1, most compatible with malignancy. 2. Enlarging cystic lesion of the left anterior superior acetabulum has CT characteristics most typical for a geode or erosion related synovitis, but also has low-grade metabolic activity with maximum SUV 3.7. I favor arthropathy over malignancy although given the progression, surveillance is likely warranted. 3. Other imaging findings of potential clinical significance: Aortic Atherosclerosis (ICD10-I70.0). Emphysema (ICD10-J43.9).      CURRENT THERAPY:  Tamoxifen 20 mg daily started 02/14/17, held since 12/24/2017, restart 02/06/2018, she stopped in the summer of 2020 due to poor tolerance. Restarted in 09/2019 after she tried exemestane for 1 week.   INTERVAL HISTORY:  April Orozco is here for a follow up. She identified herself by face to face video. She is clinically stable, still has left chest pain, no other new complaints.  Review of system otherwise negative.  MEDICAL HISTORY:  Past Medical History:  Diagnosis Date  . Fracture, sacrum/coccyx ([place]) 1970   "Shattered tailbone"  . Heart murmur    [facility] has evaluated, felt benign  . Malignant neoplasm of lower-outer  quadrant of right female breast ([place]) 09/13/2016  . Tachycardia    Intermittent, resolved with decreased caffeine intake    SURGICAL HISTORY: Past Surgical History:  Procedure Laterality Date   . APPENDECTOMY    . AXILLARY LYMPH NODE DISSECTION Right 04/23/2017   Procedure: RIGHT AXILLARY LYMPH NODE DISSECTION;  Surgeon: Rolm Bookbinder, MD;  Location: [place];  Service: General;  Laterality: Right;  . BREAST LUMPECTOMY WITH AXILLARY LYMPH NODE BIOPSY Right 04/23/2017  . BREAST RECONSTRUCTION WITH PLACEMENT OF TISSUE EXPANDER AND FLEX HD (ACELLULAR HYDRATED DERMIS) Bilateral 04/07/2017   Procedure: BILATERAL BREAST RECONSTRUCTION WITH PLACEMENT OF TISSUE EXPANDER AND FLEX HD (ACELLULAR HYDRATED DERMIS);  Surgeon: Crissie Reese, MD;  Location: [place];  Service: Plastics;  Laterality: Bilateral;  . IR GENERIC HISTORICAL  01/10/2017   IR CV LINE INJECTION 01/10/2017 Greggory Keen, MD [facility]-INTERV RAD  . MASTECTOMY    . MASTECTOMY WITH RADIOACTIVE SEED GUIDED EXCISION AND AXILLARY SENTINEL LYMPH NODE BIOPSY Bilateral 04/07/2017   Procedure: RIGHT SKIN SPARING MASTECTOMY WITH RADIOACTIVE SEED GUIDED AXILLARY LYMPH NODE EXCISION AND AXILLARY SENTINEL LYMPH NODE BIOPSY, LEFT PROPHYLACTIC SKIN SPARING MASTECTOMY;  Surgeon: Rolm Bookbinder, MD;  Location: [place];  Service: General;  Laterality: Bilateral;  . PORT-A-CATH REMOVAL N/A 04/07/2017   Procedure: REMOVAL PORT-A-CATH;  Surgeon: Rolm Bookbinder, MD;  Location: [place];  Service: General;  Laterality: N/A;  . PORTACATH PLACEMENT Right 09/23/2016   Procedure: INSERTION PORT-A-CATH WITH [place];  Surgeon: Rolm Bookbinder, MD;  Location: [place];  Service: General;  Laterality: Right;  . TONSILECTOMY/ADENOIDECTOMY WITH MYRINGOTOMY    . TUBAL LIGATION      I have reviewed the social history and family history with the patient and they are unchanged from previous note.  ALLERGIES:  is allergic to banana; sulfur; and tramadol.  MEDICATIONS:  Current Outpatient Medications  Medication Sig Dispense Refill  . exemestane (AROMASIN) 25 MG tablet Take 1 tablet (25 mg total) by mouth daily after breakfast. 30 tablet 2  . Folic  Acid-Vit K4-YJE B12 (FOLBEE) 2.5-25-1 MG TABS tablet Take 1 tablet by mouth daily.    Marland Kitchen gabapentin (NEURONTIN) 300 MG capsule TAKE TWO CAPSULES BY MOUTH AT BEDTIME 180 capsule 1  . ibuprofen (ADVIL,MOTRIN) 200 MG tablet Take 800 mg by mouth every 6 (six) hours as needed for headache, mild pain or moderate pain.    . tamoxifen (NOLVADEX) 20 MG tablet Take 1 tablet (20 mg total) by mouth daily. (Patient not taking: Reported on 10/11/2019) 30 tablet 3  . zolpidem (AMBIEN) 5 MG tablet Take 1 tablet (5 mg total) by mouth at bedtime as needed for sleep. 30 tablet 2   No current facility-administered medications for this visit.    PHYSICAL EXAMINATION: ECOG PERFORMANCE STATUS: 0 - Asymptomatic  No vitals taken today, Exam not performed today   LABORATORY DATA:  I have reviewed the data as listed CBC Latest Ref Rng & Units 10/11/2019 12/04/2018 04/02/2018  WBC 4.0 - 10.5 K/uL 5.8 5.5 6.0  Hemoglobin 12.0 - 15.0 g/dL 12.8 13.6 13.2  Hematocrit 36.0 - 46.0 % 38.9 41.1 39.5  Platelets 150 - 400 K/uL 241 234 215     CMP Latest Ref Rng & Units 10/11/2019 12/04/2018 04/02/2018  Glucose 70 - 99 mg/dL 109(H) 113(H) 134  BUN 6 - 20 mg/dL 10 9 14   Creatinine 0.44 - 1.00 mg/dL 0.76 0.76 0.80  Sodium 135 - 145 mmol/L 141 138 140  Potassium 3.5 - 5.1 mmol/L 3.5 4.1 4.0  Chloride 98 - 111 mmol/L 102 103 104  CO2 22 - 32 mmol/L 29 27 29   Calcium 8.9 - 10.3 mg/dL 9.3 9.6 9.8  Total Protein 6.5 - 8.1 g/dL 8.3(H) 8.2(H) 7.9  Total Bilirubin 0.3 - 1.2 mg/dL 0.2(L) 0.3 0.2  Alkaline Phos 38 - 126 U/L 101 94 89  AST 15 - 41 U/L 20 19 20   ALT 0 - 44 U/L 19 16 16       RADIOGRAPHIC STUDIES: I have personally reviewed the radiological images as listed and agreed with the findings in the report. No results found.   ASSESSMENT & PLAN:  April Orozco is a 53 y.o. female with   1. 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 She is s/p neoadjuvant chemo with AC-T, B/l mastectomy and right axillary LN dissection and adjuvant radiation.  -She has been on antiestrogen therapy with Tamoxifen since 01/2017, but not very compliant due to poor tolerance.  She has stopped on her own in 06/2019 due to severe arthralgia and hot flashes. She tried Exemestane in 09/2019 for 1 week but switched back to Tamoxifen 1 week later.  -Due to her dry cough and intermittent dyspnea for the past 3 to 4 months, and significant weight loss, did CT chest.  -Her CT chest from 10/27/19 showed Interval development of left internal mammary LN.  -I reviewed her PET images from 11/17/19 with pt which shows 0.8 cm left internal mammary lymph node has a maximum SUV of 5.1, most compatible with malignancy, no also hypermetabolic lesion. This is likely distant metastasis then local recurrence since her initial breast cancer was on right. No evidence of other distant metastasis.  -I recommend LN biopsy for definitive diagnosis although location is challenge.  I have spoken with interventional radiologist Dr. Vernard Gambles this morning, he will try. She is agreeable.  -If found to be cancer recurrence I will recommend SBRT with Dr. Lisbeth Renshaw for local treatment. Will also discuss option of surgery with Dr. Donne Hazel.  -I also briefly discussed continue antiestrogen with adding CDK4/6 inhibitor Ibrance to reduce her risk of future recurrence.  -Virtual visit after biopsy.    2. Genetics, Testing was negative   3. Anxiety and insomnia -Overall improved since she completed major breast cancer treatment -We discussed dependence from Ambien, she is willing to weaned off. She is currently trying to wean off 13m Ambien  4. Neuropathy, G1 -Chemotherapy induced.  -Numbness and tingling of her fingers and toes, overall improved -Controlled with Gabapentin. I recommended she increase to [id number] night which will also help her  sleep.   5. Hot flash and night sweats -Effexor was not effective. She will increase her Gabapentin to 60050mightly.  -Stable   6. 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 Finding is not suggestive of metastatic disease. Focal tendinopathy of the distal right gluteal tendons at the insertion on the right greater trochanter -Bilateral hip pain improved on new diet with no sugar. She is not taking prescription pain medication,only take ibuprofen occasionally.  7. SOB upon exertion  -I recommend repeating her echocardiogram, due to her previous Adriamycin exposure  8. B/l breast tissue expander  -She notes her right breast has been really tight since her Radiation. She has planned for reconstruction surgery in  early 2021, but has been postponed given possible local recurrence to left chest LN.  -Will discuss with Dr. Iran Planas before RT given pain   Plan  -Continue Tamoxifen for now  -LN biopsy in 1-2 weeks by IR  -will discuss with Drs. Moody and [place]  -Virtual visit after biopsy    No problem-specific Assessment & Plan notes found for this encounter.   No orders of the defined types were placed in this encounter.  I discussed the assessment and treatment plan with the patient. The patient was provided an opportunity to ask questions and all were answered. The patient agreed with the plan and demonstrated an understanding of the instructions.  The patient was advised to call back or seek an in-person evaluation if the symptoms worsen or if the condition fails to improve as anticipated.  I provided 22 minutes of face-to-face video visit time during this encounter, and > 50% was spent counseling as documented under my assessment & plan.    Truitt Merle, MD 11/22/2019   I, Joslyn Devon, am acting as scribe for Truitt Merle, MD.    I have reviewed the above documentation for accuracy and completeness, and I agree with the above.

## 2019-11-24 ENCOUNTER — Encounter: Payer: Self-pay | Admitting: Hematology

## 2019-11-25 ENCOUNTER — Telehealth (HOSPITAL_COMMUNITY): Payer: Self-pay

## 2019-11-25 NOTE — Addendum Note (Signed)
Addended by: Gildardo Pounds D on: 11/25/2019 10:27 AM   Modules accepted: Orders

## 2019-11-25 NOTE — Telephone Encounter (Signed)
Called to schedule biopsy, no answer, left vm. AW 

## 2019-11-25 NOTE — Telephone Encounter (Signed)
-----   Message from Lenore Cordia sent at 11/24/2019  1:49 PM EST ----- Regarding: FW: Biopsy  ----- Message ----- From: Arne Cleveland, MD Sent: 11/24/2019   1:46 PM EST To: Lenore Cordia Subject: RE: Biopsy                                     [facility] with IR  Thx  DDH  ----- Message ----- From: Lenore Cordia Sent: 11/24/2019   1:35 PM EST To: Arne Cleveland, MD Subject: RE: Biopsy                                     [facility] be [facility] at [facility]?  ----- Message ----- From: Arne Cleveland, MD Sent: 11/23/2019   2:25 PM EST To: Lenore Cordia Subject: RE: Biopsy                                     Ok  Small  but hot on PET  Try [place] 1st Talked with Dr Wanda Plump, she is  ok with FNA only  DDH  ----- Message ----- From: Lenore Cordia Sent: 11/23/2019   1:32 PM EST To: Ir Procedure Requests Subject: Biopsy                                         Procedure Requested:  [place] FNA    Reason for Procedure:  needle biopsy of the hypermetabolic left internal mammary node on PET   Provider Requesting:    Dr Burr Medico  Provider Telephone:    [phone]   Other Info:  Rad exams in Epic

## 2019-12-02 ENCOUNTER — Other Ambulatory Visit: Payer: Self-pay | Admitting: Radiology

## 2019-12-03 ENCOUNTER — Other Ambulatory Visit: Payer: Self-pay | Admitting: Student

## 2019-12-03 ENCOUNTER — Other Ambulatory Visit: Payer: Self-pay | Admitting: Radiology

## 2019-12-06 ENCOUNTER — Other Ambulatory Visit: Payer: Self-pay | Admitting: Hematology

## 2019-12-06 ENCOUNTER — Ambulatory Visit (HOSPITAL_COMMUNITY)
Admission: RE | Admit: 2019-12-06 | Discharge: 2019-12-06 | Disposition: A | Discharge status: Home / Self Care | Payer: BC Managed Care – PPO | Source: Ambulatory Visit | Attending: Hematology | Admitting: Hematology

## 2019-12-06 ENCOUNTER — Other Ambulatory Visit: Payer: Self-pay

## 2019-12-06 ENCOUNTER — Encounter (HOSPITAL_COMMUNITY): Payer: Self-pay

## 2019-12-06 ENCOUNTER — Ambulatory Visit (HOSPITAL_COMMUNITY)
Admission: RE | Admit: 2019-12-06 | Discharge: 2019-12-06 | Disposition: A | Discharge status: Home / Self Care | Payer: BC Managed Care – PPO | Source: Ambulatory Visit | Attending: Interventional Radiology | Admitting: Interventional Radiology

## 2019-12-06 DIAGNOSIS — Z885 Allergy status to narcotic agent status: Secondary | ICD-10-CM | POA: Diagnosis not present

## 2019-12-06 DIAGNOSIS — Z91018 Allergy to other foods: Secondary | ICD-10-CM | POA: Insufficient documentation

## 2019-12-06 DIAGNOSIS — Z801 Family history of malignant neoplasm of trachea, bronchus and lung: Secondary | ICD-10-CM | POA: Diagnosis not present

## 2019-12-06 DIAGNOSIS — Z9889 Other specified postprocedural states: Secondary | ICD-10-CM

## 2019-12-06 DIAGNOSIS — Z800 Family history of malignant neoplasm of digestive organs: Secondary | ICD-10-CM | POA: Diagnosis not present

## 2019-12-06 DIAGNOSIS — Z79899 Other long term (current) drug therapy: Secondary | ICD-10-CM | POA: Diagnosis not present

## 2019-12-06 DIAGNOSIS — Z808 Family history of malignant neoplasm of other organs or systems: Secondary | ICD-10-CM | POA: Insufficient documentation

## 2019-12-06 DIAGNOSIS — Z803 Family history of malignant neoplasm of breast: Secondary | ICD-10-CM | POA: Diagnosis not present

## 2019-12-06 DIAGNOSIS — R918 Other nonspecific abnormal finding of lung field: Secondary | ICD-10-CM | POA: Diagnosis not present

## 2019-12-06 DIAGNOSIS — Z888 Allergy status to other drugs, medicaments and biological substances status: Secondary | ICD-10-CM | POA: Insufficient documentation

## 2019-12-06 DIAGNOSIS — Z170 Estrogen receptor positive status [ER+]: Secondary | ICD-10-CM | POA: Diagnosis not present

## 2019-12-06 DIAGNOSIS — Z79811 Long term (current) use of aromatase inhibitors: Secondary | ICD-10-CM | POA: Diagnosis not present

## 2019-12-06 DIAGNOSIS — C50511 Malignant neoplasm of lower-outer quadrant of right female breast: Secondary | ICD-10-CM | POA: Diagnosis not present

## 2019-12-06 DIAGNOSIS — Z9851 Tubal ligation status: Secondary | ICD-10-CM | POA: Insufficient documentation

## 2019-12-06 DIAGNOSIS — Z9013 Acquired absence of bilateral breasts and nipples: Secondary | ICD-10-CM | POA: Diagnosis not present

## 2019-12-06 DIAGNOSIS — Z833 Family history of diabetes mellitus: Secondary | ICD-10-CM | POA: Insufficient documentation

## 2019-12-06 DIAGNOSIS — R590 Localized enlarged lymph nodes: Secondary | ICD-10-CM | POA: Diagnosis not present

## 2019-12-06 DIAGNOSIS — R896 Abnormal cytological findings in specimens from other organs, systems and tissues: Secondary | ICD-10-CM | POA: Diagnosis not present

## 2019-12-06 LAB — PROTIME-INR
INR: 0.9 (ref 0.8–1.2)
Prothrombin Time: 11.9 seconds (ref 11.4–15.2)

## 2019-12-06 LAB — CBC
HCT: 41.1 % (ref 36.0–46.0)
Hemoglobin: 13.4 g/dL (ref 12.0–15.0)
MCH: 29 pg (ref 26.0–34.0)
MCHC: 32.6 g/dL (ref 30.0–36.0)
MCV: 89 fL (ref 80.0–100.0)
Platelets: 278 10*3/uL (ref 150–400)
RBC: 4.62 MIL/uL (ref 3.87–5.11)
RDW: 14.6 % (ref 11.5–15.5)
WBC: 9.1 10*3/uL (ref 4.0–10.5)
nRBC: 0 % (ref 0.0–0.2)

## 2019-12-06 MED ORDER — HYDROCODONE-ACETAMINOPHEN 5-325 MG PO TABS: 1.0000 | ORAL_TABLET | ORAL | Status: DC | PRN

## 2019-12-06 MED ORDER — FENTANYL CITRATE (PF) 100 MCG/2ML IJ SOLN
INTRAMUSCULAR | Status: AC | PRN
  Administered 2019-12-06: 50 ug via INTRAVENOUS
  Administered 2019-12-06 (×2): 25 ug via INTRAVENOUS

## 2019-12-06 MED ORDER — SODIUM CHLORIDE 0.9 % IV SOLN
INTRAVENOUS | Status: AC | PRN
  Administered 2019-12-06: 10 mL/h via INTRAVENOUS

## 2019-12-06 MED ORDER — MIDAZOLAM HCL 2 MG/2ML IJ SOLN
INTRAMUSCULAR | Status: AC | PRN
  Administered 2019-12-06 (×2): 0.5 mg via INTRAVENOUS
  Administered 2019-12-06: 1 mg via INTRAVENOUS

## 2019-12-06 MED ORDER — FENTANYL CITRATE (PF) 100 MCG/2ML IJ SOLN
INTRAMUSCULAR | Status: AC
  Filled 2019-12-06: qty 2

## 2019-12-06 MED ORDER — SODIUM CHLORIDE 0.9 % IV SOLN: INTRAVENOUS | Status: DC

## 2019-12-06 MED ORDER — MIDAZOLAM HCL 2 MG/2ML IJ SOLN
INTRAMUSCULAR | Status: AC
  Filled 2019-12-06: qty 2

## 2019-12-06 NOTE — Discharge Instructions (Signed)
Needle Biopsy, Care After These instructions tell you how to care for yourself after your procedure. Your doctor may also give you more specific instructions. Call your doctor if you have any problems or questions. What can I expect after the procedure? After the procedure, it is common to have:  Soreness.  Bruising.  Mild pain. Follow these instructions at home:   Return to your normal activities as told by your doctor. Ask your doctor what activities are safe for you.  Take over-the-counter and prescription medicines only as told by your doctor.  Wash your hands with soap and water before you change your bandage (dressing). If you cannot use soap and water, use hand sanitizer.  Follow instructions from your doctor about: ? How to take care of your puncture site. ? When and how to change your bandage. ? When to remove your bandage.  Check your puncture site every day for signs of infection. Watch for: ? Redness, swelling, or pain. ? Fluid or blood. ? Pus or a bad smell. ? Warmth.  Do not take baths, swim, or use a hot tub until your doctor approves. Ask your doctor if you may take showers. You may only be allowed to take sponge baths.  Keep all follow-up visits as told by your doctor. This is important. Contact a doctor if you have:  A fever.  Redness, swelling, or pain at the puncture site, and it lasts longer than a few days.  Fluid, blood, or pus coming from the puncture site.  Warmth coming from the puncture site. Get help right away if:  You have a lot of bleeding from the puncture site. Summary  After the procedure, it is common to have soreness, bruising, or mild pain at the puncture site.  Check your puncture site every day for signs of infection, such as redness, swelling, or pain.  Get help right away if you have severe bleeding from your puncture site. This information is not intended to replace advice given to you by your health care provider. Make  sure you discuss any questions you have with your health care provider. Document Revised: 11/24/2017 Document Reviewed: 11/24/2017 Elsevier Patient Education  2020 Elsevier Inc. Moderate Conscious Sedation, Adult Sedation is the use of medicines to promote relaxation and relieve discomfort and anxiety. Moderate conscious sedation is a type of sedation. Under moderate conscious sedation, you are less alert than normal, but you are still able to respond to instructions, touch, or both. Moderate conscious sedation is used during short medical and dental procedures. It is milder than deep sedation, which is a type of sedation under which you cannot be easily woken up. It is also milder than general anesthesia, which is the use of medicines to make you unconscious. Moderate conscious sedation allows you to return to your regular activities sooner. Tell a health care provider about:  Any allergies you have.  All medicines you are taking, including vitamins, herbs, eye drops, creams, and over-the-counter medicines.  Use of steroids (by mouth or creams).  Any problems you or family members have had with sedatives and anesthetic medicines.  Any blood disorders you have.  Any surgeries you have had.  Any medical conditions you have, such as sleep apnea.  Whether you are pregnant or may be pregnant.  Any use of cigarettes, alcohol, marijuana, or street drugs. What are the risks? Generally, this is a safe procedure. However, problems may occur, including:  Getting too much medicine (oversedation).  Nausea.  Allergic reaction to   medicines.  Trouble breathing. If this happens, a breathing tube may be used to help with breathing. It will be removed when you are awake and breathing on your own.  Heart trouble.  Lung trouble. What happens before the procedure? Staying hydrated Follow instructions from your health care provider about hydration, which may include:  Up to 2 hours before the  procedure - you may continue to drink clear liquids, such as water, clear fruit juice, black coffee, and plain tea. Eating and drinking restrictions Follow instructions from your health care provider about eating and drinking, which may include:  8 hours before the procedure - stop eating heavy meals or foods such as meat, fried foods, or fatty foods.  6 hours before the procedure - stop eating light meals or foods, such as toast or cereal.  6 hours before the procedure - stop drinking milk or drinks that contain milk.  2 hours before the procedure - stop drinking clear liquids. Medicine Ask your health care provider about:  Changing or stopping your regular medicines. This is especially important if you are taking diabetes medicines or blood thinners.  Taking medicines such as aspirin and ibuprofen. These medicines can thin your blood. Do not take these medicines before your procedure if your health care provider instructs you not to.  Tests and exams  You will have a physical exam.  You may have blood tests done to show: ? How well your kidneys and liver are working. ? How well your blood can clot. General instructions  Plan to have someone take you home from the hospital or clinic.  If you will be going home right after the procedure, plan to have someone with you for 24 hours. What happens during the procedure?  An IV tube will be inserted into one of your veins.  Medicine to help you relax (sedative) will be given through the IV tube.  The medical or dental procedure will be performed. What happens after the procedure?  Your blood pressure, heart rate, breathing rate, and blood oxygen level will be monitored often until the medicines you were given have worn off.  Do not drive for 24 hours. This information is not intended to replace advice given to you by your health care provider. Make sure you discuss any questions you have with your health care provider. Document  Revised: 10/24/2017 Document Reviewed: 03/02/2016 Elsevier Patient Education  2020 Elsevier Inc.  

## 2019-12-06 NOTE — Progress Notes (Signed)
Discharge instructions reviewed with pt and her daughter (via telephone) both voice understanding.  

## 2019-12-06 NOTE — Procedures (Signed)
  Procedure: [place] FNA L int mammary LN x4 EBL:   minimal Complications:  none immediate  See full dictation in [employer].  Dillard Cannon MD Main # [phone] Pager  [phone]

## 2019-12-06 NOTE — H&P (Signed)
Chief Complaint: Patient was seen in consultation today for left internal mammary lymph node biopsy at the request of Feng,Yan  Referring Physician(s): Feng,Yan  Supervising Physician: Arne Cleveland  Patient Status: [facility] - Out-pt  History of Present Illness: April Orozco is a 54 y.o. female   Dx Rt Breast Ca 08/2016 04/07/2017 Surgery   RIGHT SKIN SPARING MASTECTOMY WITH RADIOACTIVE SEED GUIDED AXILLARY LYMPH NODE EXCISION AND AXILLARY SENTINEL LYMPH NODE BIOPSY, LEFT PROPHYLACTIC SKIN SPARING MASTECTOMY by Dr. Donne Hazel   Pt undergoing reconstruction surgeries Noted wt loss and loss of energy  Dr Burr Medico note 11/22/19:  -Her CT chest from 10/27/19 showed Interval development of left internal mammary LN.  -I reviewed her PET images from 11/17/19 with pt which shows 0.8 cm left internal mammary lymph node has a maximum SUV of 5.1, most compatible with malignancy, no also hypermetabolic lesion. This is likely distant metastasis then local recurrence since her initial breast cancer was on right. No evidence of other distant metastasis.  -I recommend LN biopsy for definitive diagnosis although location is challenge.  I have spoken with interventional radiologist Dr. Vernard Gambles this morning, he will try. She is agreeable.   Scheduled now for biopsy   Past Medical History:  Diagnosis Date  . Fracture, sacrum/coccyx ([place]) 1970   "Shattered tailbone"  . Heart murmur    [facility] Cardiovascular has evaluated, felt benign  . 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 Tachycardia    Intermittent, resolved with decreased caffeine intake    Past Surgical History:  Procedure Laterality Date  . APPENDECTOMY    . AXILLARY LYMPH NODE DISSECTION Right 04/23/2017   Procedure: RIGHT AXILLARY LYMPH NODE DISSECTION;  Surgeon: Rolm Bookbinder, MD;  Location: [place];  Service: General;  Laterality: Right;  . BREAST LUMPECTOMY WITH AXILLARY LYMPH NODE BIOPSY  Right 04/23/2017  . BREAST RECONSTRUCTION WITH PLACEMENT OF TISSUE EXPANDER AND FLEX HD (ACELLULAR HYDRATED DERMIS) Bilateral 04/07/2017   Procedure: BILATERAL BREAST RECONSTRUCTION WITH PLACEMENT OF TISSUE EXPANDER AND FLEX HD (ACELLULAR HYDRATED DERMIS);  Surgeon: Crissie Reese, MD;  Location: [place];  Service: Plastics;  Laterality: Bilateral;  . IR GENERIC HISTORICAL  01/10/2017   IR CV LINE INJECTION 01/10/2017 Greggory Keen, MD [facility]-INTERV RAD  . MASTECTOMY    . MASTECTOMY WITH RADIOACTIVE SEED GUIDED EXCISION AND AXILLARY SENTINEL LYMPH NODE BIOPSY Bilateral 04/07/2017   Procedure: RIGHT SKIN SPARING MASTECTOMY WITH RADIOACTIVE SEED GUIDED AXILLARY LYMPH NODE EXCISION AND AXILLARY SENTINEL LYMPH NODE BIOPSY, LEFT PROPHYLACTIC SKIN SPARING MASTECTOMY;  Surgeon: Rolm Bookbinder, MD;  Location: [place] [facility];  Service: General;  Laterality: Bilateral;  . PORT-A-CATH REMOVAL N/A 04/07/2017   Procedure: REMOVAL PORT-A-CATH;  Surgeon: Rolm Bookbinder, MD;  Location: [place];  Service: General;  Laterality: N/A;  . PORTACATH PLACEMENT Right 09/23/2016   Procedure: INSERTION PORT-A-CATH WITH [place];  Surgeon: Rolm Bookbinder, MD;  Location: [place];  Service: General;  Laterality: Right;  . TONSILECTOMY/ADENOIDECTOMY WITH MYRINGOTOMY    . TUBAL LIGATION      Allergies: Banana, Sulfur, and Tramadol  Medications: Prior to Admission medications   Medication Sig Start Date End Date Taking? Authorizing Provider  Folic Acid-Vit [id number] [id number] (FOLBEE) 2.5-25-1 MG TABS tablet Take 1 tablet by mouth daily.   Yes [provider]  gabapentin (NEURONTIN) 300 MG capsule TAKE TWO CAPSULES BY MOUTH AT BEDTIME 07/14/19  Yes Truitt Merle, MD  ibuprofen (ADVIL,MOTRIN) 200 MG tablet Take 800 mg by mouth every 6 (  six) hours as needed for headache, mild pain or moderate pain.   Yes [provider]  tamoxifen (NOLVADEX) 20 MG tablet Take 1 tablet (20 mg total) by mouth daily. 09/22/17  Yes Alla Feeling, NP  zolpidem (AMBIEN) 5 MG tablet Take 1 tablet (5 mg total) by mouth at bedtime as needed for sleep. 10/11/19  Yes Truitt Merle, MD  exemestane (AROMASIN) 25 MG tablet Take 1 tablet (25 mg total) by mouth daily after breakfast. 10/11/19   Truitt Merle, MD     Family History  Problem Relation Age of Onset  . Diabetes Mother   . Diabetes Father   . Skin cancer Father 38       NOS type; worked as a Theme park manager for many years  . Hypertension Other   . Breast cancer Cousin 45       paternal 1st cousin; w/ mets to bone  . Breast cancer Cousin        paternal 1st cousin dx 26-50; s/p lump  . Breast cancer Cousin        paternal 1st cousin dx 77-53 w/ mets to LN; s/p BL mastectomies  . Heart disease Maternal Aunt 58  . Colon cancer Paternal Uncle        dx 67-68  . Goiter Maternal Grandmother        d. bled to death following goiter surgery  . Heart attack Maternal Grandfather        d. early 42s  . Lung cancer Paternal Grandmother        dx 63s; d. 88y  . Diabetes Paternal Grandmother   . Heart failure Paternal Grandfather        d. 75-76  . Alzheimer's disease Paternal Grandfather   . Other Daughter 53       daughter w/ hx benign breast lump/cyst removed  . Epilepsy Maternal Aunt   . Alzheimer's disease Paternal Aunt   . Brain cancer Cousin 33       paternal 1st cousin; d. 33y; NOS type    Social History   Socioeconomic History  . Marital status: Widowed    Spouse name: Not on file  . Number of children: 5  . Years of education: Not on file  . Highest education level: Not on file  Occupational History  . Occupation: Programmer, multimedia: [employer]    Comment: OR trauma nurse  Tobacco Use  . Smoking status: Never Smoker  . Smokeless tobacco: Never Used  Substance and Sexual Activity  . Alcohol use: Yes    Comment: social drinker   . Drug use: No  . Sexual activity: Not on file  Other Topics Concern  . Not on file  Social History Narrative  . Not on file   Social  Determinants of Health   Financial Resource Strain:   . Difficulty of Paying Living Expenses: Not on file  Food Insecurity:   . Worried About Charity fundraiser in the Last Year: Not on file  . Ran Out of Food in the Last Year: Not on file  Transportation Needs:   . Lack of Transportation (Medical): Not on file  . Lack of Transportation (Non-Medical): Not on file  Physical Activity:   . Days of Exercise per Week: Not on file  . Minutes of Exercise per Session: Not on file  Stress:   . Feeling of Stress : Not on file  Social Connections:   . Frequency of Communication with  Friends and Family: Not on file  . Frequency of Social Gatherings with Friends and Family: Not on file  . Attends Religious Services: Not on file  . Active Member of Clubs or Organizations: Not on file  . Attends Archivist Meetings: Not on file  . Marital Status: Not on file    Review of Systems: A 12 point ROS discussed and pertinent positives are indicated in the HPI above.  All other systems are negative.  Review of Systems  Constitutional: Positive for fatigue and unexpected weight change. Negative for activity change.  Respiratory: Negative for cough and shortness of breath.   Cardiovascular: Negative for chest pain.  Gastrointestinal: Negative for abdominal pain.  Neurological: Negative for weakness.  Psychiatric/Behavioral: Negative for behavioral problems and confusion.    Vital Signs: BP 120/60   Pulse 78   Temp 98.2 F (36.8 C) (Oral)   Resp (!) 22   Ht 5\' 4"  (1.626 m)   Wt 154 lb (69.9 kg)   SpO2 99%   BMI 26.43 kg/m   Physical Exam Vitals reviewed.  Cardiovascular:     Rate and Rhythm: Regular rhythm.     Heart sounds: Normal heart sounds.  Pulmonary:     Effort: Pulmonary effort is normal.     Breath sounds: Normal breath sounds.  Abdominal:     Palpations: Abdomen is soft.  Musculoskeletal:        General: Normal range of motion.  Skin:    General: Skin is warm  and dry.  Neurological:     Mental Status: She is alert and oriented to person, place, and time.  Psychiatric:        Mood and Affect: Mood normal.        Behavior: Behavior normal.        Thought Content: Thought content normal.        Judgment: Judgment normal.     Imaging: NM PET Image Initial (PI) Skull Base To Thigh  Result Date: 11/17/2019 CLINICAL DATA:  Subsequent treatment strategy for right breast cancer. EXAM: NUCLEAR MEDICINE PET SKULL BASE TO THIGH TECHNIQUE: 8.0 mCi F-18 FDG was injected intravenously. Full-ring PET imaging was performed from the skull base to thigh after the radiotracer. CT data was obtained and used for attenuation correction and anatomic localization. Fasting blood glucose: 107 mg/dl COMPARISON:  CT chest 10/27/2019 FINDINGS: Mediastinal blood pool activity: SUV max 2.6 Liver activity: SUV max NA NECK: No significant abnormal hypermetabolic activity in this region. Incidental CT findings: Bilateral common carotid atherosclerotic calcification. CHEST: 0.8 cm left internal mammary lymph node on image 68/4 has maximum SUV of 5.1, most compatible with malignancy. A smaller 0.4 cm left internal mammary node on image 73/4 is not discernibly hypermetabolic but is small in size. Incidental CT findings: Right mastectomy. Left breast implant. Atherosclerotic calcification of the thoracic aorta branch vessels. Paraseptal emphysema. Interstitial accentuation anteriorly in the right lung in a pattern compatible with radiation port. ABDOMEN/PELVIS: No significant abnormal hypermetabolic activity in this region. Incidental CT findings: Aortoiliac atherosclerotic vascular disease. SKELETON: Enlarging subcortical cystic lesion along the left anterior superior acetabulum with sclerotic margins, central lucency, and extension potentially to the cortical surface. The lesion measures 2.6 by 1.6 cm on image 177/4 and has maximum SUV of 3.7. This lesion has significantly progressed compared  to the MRI pelvis from 02/13/2018. Incidental CT findings: none IMPRESSION: 1. 0.8 cm left internal mammary lymph node has a maximum SUV of 5.1, most compatible with malignancy. 2.  Enlarging cystic lesion of the left anterior superior acetabulum has CT characteristics most typical for a geode or erosion related synovitis, but also has low-grade metabolic activity with maximum SUV 3.7. I favor arthropathy over malignancy although given the progression, surveillance is likely warranted. 3. Other imaging findings of potential clinical significance: Aortic Atherosclerosis (ICD10-I70.0). Emphysema (ICD10-J43.9). Electronically Signed   By: Van Clines M.D.   On: 11/17/2019 16:34    Labs:  CBC: Recent Labs    10/11/19 1358 12/06/19 0654  WBC 5.8 9.1  HGB 12.8 13.4  HCT 38.9 41.1  PLT 241 278    COAGS: Recent Labs    12/06/19 0654  INR 0.9    BMP: Recent Labs    10/11/19 1358  NA 141  K 3.5  CL 102  CO2 29  GLUCOSE 109*  BUN 10  CALCIUM 9.3  CREATININE 0.76  GFRNONAA >60  GFRAA >60    LIVER FUNCTION TESTS: Recent Labs    10/11/19 1358  BILITOT 0.2*  AST 20  ALT 19  ALKPHOS 101  PROT 8.3*  ALBUMIN 4.0    TUMOR MARKERS: No results for input(s): AFPTM, CEA, CA199, CHROMGRNA in the last 8760 hours.  Assessment and Plan:  Hx Rt Breast Ca 08/2016 Bilat mastectomy and reconstruction ongoing Follow up scan with Dr Burr Medico 12/20: shows new small L Internal Mammary LAN +PET Now scheduled for biopsy of same Risks and benefits of left internal mammary LN bx was discussed with the patient and/or patient's family including, but not limited to bleeding, infection, damage to adjacent structures or low yield requiring additional tests.  All of the questions were answered and there is agreement to proceed.  Consent signed and in chart.    Thank you for this interesting consult.  I greatly enjoyed meeting REUBEN WINER and look forward to participating in their care.   A copy of this report was sent to the requesting provider on this date.  Electronically Signed: Lavonia Drafts, PA-C 12/06/2019, 8:40 AM   I spent a total of  30 Minutes   in face to face in clinical consultation, greater than 50% of which was counseling/coordinating care for L Int mammary LN bx

## 2019-12-07 LAB — CYTOLOGY - NON PAP

## 2019-12-09 ENCOUNTER — Encounter: Payer: Self-pay | Admitting: Hematology

## 2019-12-09 ENCOUNTER — Other Ambulatory Visit: Payer: Self-pay | Admitting: Hematology

## 2019-12-09 ENCOUNTER — Telehealth (HOSPITAL_COMMUNITY): Payer: Self-pay

## 2019-12-09 DIAGNOSIS — C50511 Malignant neoplasm of lower-outer quadrant of right female breast: Secondary | ICD-10-CM

## 2019-12-09 NOTE — Telephone Encounter (Signed)
Called to schedule biopsy, no answer, left vm. AW 

## 2019-12-13 ENCOUNTER — Telehealth (HOSPITAL_COMMUNITY): Payer: Self-pay

## 2019-12-13 NOTE — Telephone Encounter (Signed)
-----   Message from Arne Cleveland, MD sent at 12/10/2019  4:50 PM EST ----- Regarding: RE: Biopsy request [place] FNA again try to get more tissue  Thx DDH  ----- Message ----- From: Danielle Dess Sent: 12/09/2019   3:49 PM EST To: Arne Cleveland, MD Subject: Biopsy request                                 Vernard Gambles,  I just received a call from Dr. Ernestina Penna nurse that you approved a 2nd biopsy for Ms. Neria. Do you want it done in IR or [place] again?  Thanks, Ash   Dx: left internal mammary node biopsy (second time)  Imaging: In Epic

## 2019-12-14 ENCOUNTER — Other Ambulatory Visit: Payer: Self-pay

## 2019-12-14 ENCOUNTER — Ambulatory Visit
Admission: RE | Admit: 2019-12-14 | Discharge: 2019-12-14 | Disposition: A | Discharge status: Home / Self Care | Payer: BC Managed Care – PPO | Source: Ambulatory Visit | Attending: Radiation Oncology | Admitting: Radiation Oncology

## 2019-12-14 ENCOUNTER — Ambulatory Visit
Admission: RE | Admit: 2019-12-14 | Payer: BC Managed Care – PPO | Source: Ambulatory Visit | Admitting: Radiation Oncology

## 2019-12-14 ENCOUNTER — Encounter: Payer: Self-pay | Admitting: Radiation Oncology

## 2019-12-14 DIAGNOSIS — Z510 Encounter for antineoplastic radiation therapy: Secondary | ICD-10-CM | POA: Insufficient documentation

## 2019-12-14 DIAGNOSIS — C50511 Malignant neoplasm of lower-outer quadrant of right female breast: Secondary | ICD-10-CM

## 2019-12-14 DIAGNOSIS — Z170 Estrogen receptor positive status [ER+]: Secondary | ICD-10-CM | POA: Insufficient documentation

## 2019-12-14 DIAGNOSIS — C773 Secondary and unspecified malignant neoplasm of axilla and upper limb lymph nodes: Secondary | ICD-10-CM | POA: Diagnosis not present

## 2019-12-14 DIAGNOSIS — I890 Lymphedema, not elsewhere classified: Secondary | ICD-10-CM | POA: Diagnosis not present

## 2019-12-14 NOTE — Progress Notes (Signed)
Radiation Oncology         [phone] ________________________________  Re-Consultation - Conducted via telephone due to current COVID-19 concerns for limiting patient exposure  I spoke with the patient to conduct this consult visit via telephone to spare the patient unnecessary potential exposure in the healthcare setting during the current COVID-19 pandemic. The patient was notified in advance and was offered a [place] meeting to allow for face to face communication but unfortunately reported that they did not have the appropriate resources/technology to support such a visit and instead preferred to proceed with a telephone visit.  ________________________________  Name: April Orozco        MRN: [id number]  Date of Service: 12/14/2019 DOB: 31-Mar-1966  [id number], No Pcp Per  Truitt Merle, MD     REFERRING PHYSICIAN: Truitt Merle, MD   DIAGNOSIS: The encounter diagnosis was Malignant neoplasm of lower-outer quadrant of right breast of female, estrogen receptor positive (April Orozco).   HISTORY OF PRESENT ILLNESS: April Orozco is a 54 y.o. female seen at the request of Dr. Burr Medico with a known history of breast cancer.  She was previously treated with Dr. 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  She had undergone mastectomy with reconstruction which was complicated by delayed healing, she ultimately went on to receive her radiotherapy but she did have significant desquamation, and her radiotherapy was discontinued early during the boost phase. Since that time, the patient was started on tamoxifen, though this had to be discontinued in December 2020 due to poor tolerance.  This was restarted in November 2020 after trying exemestane.  She developed a dry cough and intermittent dyspnea in late fall 2020 CT scan on 10/27/2019 revealed interval development of a left internal mammary lymph node, she proceeded with PET scan on  11/17/2019 which revealed an 8 mm left internal mammary lymph node with an SUV of 5.1 concerning for malignancy.  No other areas of metastatic disease were identified.  Unfortunately the site was not amenable for surgery, but she did undergo a biopsy on 12/05/2018 one of the left internal mammary lymph node revealing atypical cells. Given this finding and the suspicion for this being recurrent disease she is seen today to discuss stereotactic body radiotherapy to the site.    PREVIOUS RADIATION THERAPY: Yes   08/11/2017 - 10/03/2017: The patient initially received a dose of 50.4 Gy in 28 fractions to the breast/chest wall and regional nodes using whole-breast tangent fields. This was delivered using a 3-D conformal technique. The patient then was to receive a 10 Gy boost to the seroma, but she stopped treatment early and only received 8 Gy. The total dose was 58.4 Gy.  PAST MEDICAL HISTORY:  Past Medical History:  Diagnosis Date   Fracture, sacrum/coccyx ([place]) 1970   "Shattered tailbone"   Heart murmur    [facility] has evaluated, felt benign   Malignant neoplasm of lower-outer quadrant of right female breast (April Orozco) 09/13/2016   Tachycardia    Intermittent, resolved with decreased caffeine intake       PAST SURGICAL HISTORY: Past Surgical History:  Procedure Laterality Date   APPENDECTOMY     AXILLARY LYMPH NODE DISSECTION Right 04/23/2017   Procedure: RIGHT AXILLARY LYMPH NODE DISSECTION;  Surgeon: Rolm Bookbinder, MD;  Location: [facility];  Service: General;  Laterality: Right;   BREAST LUMPECTOMY WITH AXILLARY LYMPH NODE BIOPSY Right 04/23/2017   BREAST RECONSTRUCTION WITH PLACEMENT OF  TISSUE EXPANDER AND FLEX HD (ACELLULAR HYDRATED DERMIS) Bilateral 04/07/2017   Procedure: BILATERAL BREAST RECONSTRUCTION WITH PLACEMENT OF TISSUE EXPANDER AND FLEX HD (ACELLULAR HYDRATED DERMIS);  Surgeon: Crissie Reese, MD;  Location: [facility];  Service: Plastics;  Laterality: Bilateral;    IR GENERIC HISTORICAL  01/10/2017   IR CV LINE INJECTION 01/10/2017 Greggory Keen, MD [facility]   MASTECTOMY     MASTECTOMY WITH RADIOACTIVE SEED GUIDED EXCISION AND AXILLARY SENTINEL LYMPH NODE BIOPSY Bilateral 04/07/2017   Procedure: RIGHT SKIN SPARING MASTECTOMY WITH RADIOACTIVE SEED GUIDED AXILLARY LYMPH NODE EXCISION AND AXILLARY SENTINEL LYMPH NODE BIOPSY, LEFT PROPHYLACTIC SKIN SPARING MASTECTOMY;  Surgeon: Rolm Bookbinder, MD;  Location: [facility];  Service: General;  Laterality: Bilateral;   PORT-A-CATH REMOVAL N/A 04/07/2017   Procedure: REMOVAL PORT-A-CATH;  Surgeon: Rolm Bookbinder, MD;  Location: [facility];  Service: General;  Laterality: N/A;   PORTACATH PLACEMENT Right 09/23/2016   Procedure: [place] WITH [place];  Surgeon: Rolm Bookbinder, MD;  Location: [facility];  Service: General;  Laterality: Right;   TONSILECTOMY/ADENOIDECTOMY WITH MYRINGOTOMY     TUBAL LIGATION       FAMILY HISTORY:  Family History  Problem Relation Age of Onset   Diabetes Mother    Diabetes Father    Skin cancer Father 25       NOS type; worked as a Theme park manager for many years   Hypertension Other    Breast cancer April Orozco 15       paternal 1st cousin; w/ mets to bone   Breast cancer Cousin        paternal 1st cousin dx 51-50; s/p lump   Breast cancer Cousin        paternal 1st cousin dx 80-53 w/ mets to LN; s/p BL mastectomies   Heart disease Maternal Aunt 58   Colon cancer Paternal Uncle        dx 67-68   Goiter Maternal Grandmother        d. bled to death following goiter surgery   Heart attack Maternal Grandfather        d. early 58s   Lung cancer Paternal Grandmother        dx 91s; d. 88y   Diabetes Paternal Grandmother    Heart failure Paternal Grandfather        d. 54-76   Alzheimer's disease Paternal Grandfather    Other Daughter 4       daughter w/ hx benign breast lump/cyst removed   Epilepsy Maternal Aunt    Alzheimer's disease  Paternal Aunt    Brain cancer Cousin 22       paternal 1st cousin; d. 61y; NOS type     SOCIAL HISTORY:  reports that she has never smoked. She has never used smokeless tobacco. She reports current alcohol use. She reports that she does not use drugs.  The patient is widowed.  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   ALLERGIES: Banana, Sulfur, and Tramadol   MEDICATIONS:  Current Outpatient Medications  Medication Sig Dispense Refill   exemestane (AROMASIN) 25 MG tablet Take 1 tablet (25 mg total) by mouth daily after breakfast. 30 tablet 2   Folic Acid-Vit O6-ZTI W58 (FOLBEE) 2.5-25-1 MG TABS tablet Take 1 tablet by mouth daily.     gabapentin (NEURONTIN) 300 MG capsule TAKE TWO CAPSULES BY MOUTH AT BEDTIME 180 capsule 1   ibuprofen (ADVIL,MOTRIN) 200 MG tablet Take 800 mg by mouth every 6 (six)  hours as needed for headache, mild pain or moderate pain.     tamoxifen (NOLVADEX) 20 MG tablet Take 1 tablet (20 mg total) by mouth daily. 30 tablet 3   zolpidem (AMBIEN) 5 MG tablet Take 1 tablet (5 mg total) by mouth at bedtime as needed for sleep. 30 tablet 2   No current facility-administered medications for this encounter.     REVIEW OF SYSTEMS: On review of systems, the patient reports that she is doing okay.  She reports she has been having some terrible joint aches in her hips and reports that she still is able to proceed with tamoxifen but this is quite bothersome. She is still able to work but some days at the end of her shifts it is quite difficult to walk due to pain in her hips. She reports she does have significant discomfort in the right chest and tightness of her skin since completing her radiotherapy. She has been frustrated by lymphedema in this site as well and has worked as much as possible with the PT team in [place]. She thinks she has exhausted her options but is willing to talk to the PT team here in [place]. She denies any sternal chest pain,  shortness of breath, cough, fevers, chills, night sweats, unintended weight changes. She denies any bowel or bladder disturbances, and denies abdominal pain, nausea or vomiting. She denies any new musculoskeletal or joint aches or pains. A complete review of systems is obtained and is otherwise negative.     PHYSICAL EXAM:  Wt Readings from Last 3 Encounters:  12/06/19 154 lb (69.9 kg)  10/11/19 154 lb 1.6 oz (69.9 kg)  12/04/18 172 lb 4.8 oz (78.2 kg)   Temp Readings from Last 3 Encounters:  12/06/19 98.2 F (36.8 C) (Oral)  10/11/19 98 F (36.7 C) (Temporal)  12/04/18 98.7 F (37.1 C) (Oral)   BP Readings from Last 3 Encounters:  12/06/19 (!) 115/59  10/11/19 138/62  12/04/18 114/62   Pulse Readings from Last 3 Encounters:  12/06/19 70  10/11/19 88  12/04/18 84   Unable to assess due to encounter type.  ECOG = 1  0 - Asymptomatic (Fully active, able to carry on all predisease activities without restriction)  1 - Symptomatic but completely ambulatory (Restricted in physically strenuous activity but ambulatory and able to carry out work of a light or sedentary nature. For example, light housework, office work)  2 - Symptomatic, <50% in bed during the day (Ambulatory and capable of all self care but unable to carry out any work activities. Up and about more than 50% of waking hours)  3 - Symptomatic, >50% in bed, but not bedbound (Capable of only limited self-care, confined to bed or chair 50% or more of waking hours)  4 - Bedbound (Completely disabled. Cannot carry on any self-care. Totally confined to bed or chair)  5 - Death   Eustace Pen MM, Creech RH, Tormey DC, et al. [phone]). "Toxicity and response criteria of the [facility]". [place] Oncol. 5 (6): 649-55    LABORATORY DATA:  Lab Results  Component Value Date   WBC 9.1 12/06/2019   HGB 13.4 12/06/2019   HCT 41.1 12/06/2019   MCV 89.0 12/06/2019   PLT 278 12/06/2019   Lab Results   Component Value Date   NA 141 10/11/2019   K 3.5 10/11/2019   CL 102 10/11/2019   CO2 29 10/11/2019   Lab Results  Component Value Date  ALT 19 10/11/2019   AST 20 10/11/2019   ALKPHOS 101 10/11/2019   BILITOT 0.2 (L) 10/11/2019      RADIOGRAPHY: DG Chest 1 View  Result Date: 12/06/2019 CLINICAL DATA:  Status post biopsy of hypermetabolic left internal mammary node on PET scan. EXAM: CHEST  1 VIEW COMPARISON:  Chest x-ray dated 10/05/2016 and chest CT dated 10/27/2019 FINDINGS: The heart size and pulmonary vascularity are normal and the lungs are clear. No pneumothorax after left internal mammary node biopsy. No effusions. Breast expander in the left breast. No bone abnormality. IMPRESSION: No pneumothorax after left internal mammary node biopsy. No significant abnormalities. Electronically Signed   By: Lorriane Shire M.D.   On: 12/06/2019 10:28   NM PET Image Initial (PI) Skull Base To Thigh  Result Date: 11/17/2019 CLINICAL DATA:  Subsequent treatment strategy for right breast cancer. EXAM: NUCLEAR MEDICINE PET SKULL BASE TO THIGH TECHNIQUE: 8.0 mCi F-18 FDG was injected intravenously. Full-ring PET imaging was performed from the skull base to thigh after the radiotracer. CT data was obtained and used for attenuation correction and anatomic localization. Fasting blood glucose: 107 mg/dl COMPARISON:  CT chest 10/27/2019 FINDINGS: Mediastinal blood pool activity: SUV max 2.6 Liver activity: SUV max NA NECK: No significant abnormal hypermetabolic activity in this region. Incidental CT findings: Bilateral common carotid atherosclerotic calcification. CHEST: 0.8 cm left internal mammary lymph node on image 68/4 has maximum SUV of 5.1, most compatible with malignancy. A smaller 0.4 cm left internal mammary node on image 73/4 is not discernibly hypermetabolic but is small in size. Incidental CT findings: Right mastectomy. Left breast implant. Atherosclerotic calcification of the thoracic aorta  Orozco vessels. Paraseptal emphysema. Interstitial accentuation anteriorly in the right lung in a pattern compatible with radiation port. ABDOMEN/PELVIS: No significant abnormal hypermetabolic activity in this region. Incidental CT findings: Aortoiliac atherosclerotic vascular disease. SKELETON: Enlarging subcortical cystic lesion along the left anterior superior acetabulum with sclerotic margins, central lucency, and extension potentially to the cortical surface. The lesion measures 2.6 by 1.6 cm on image 177/4 and has maximum SUV of 3.7. This lesion has significantly progressed compared to the MRI pelvis from 02/13/2018. Incidental CT findings: none IMPRESSION: 1. 0.8 cm left internal mammary lymph node has a maximum SUV of 5.1, most compatible with malignancy. 2. Enlarging cystic lesion of the left anterior superior acetabulum has CT characteristics most typical for a geode or erosion related synovitis, but also has low-grade metabolic activity with maximum SUV 3.7. I favor arthropathy over malignancy although given the progression, surveillance is likely warranted. 3. Other imaging findings of potential clinical significance: Aortic Atherosclerosis (ICD10-I70.0). Emphysema (ICD10-J43.9). Electronically Signed   By: Van Clines M.D.   On: 11/17/2019 16:34       IMPRESSION/PLAN: 1. Recurrent Stage IIB, pT2N1aMx ER/PR positive invasive ductal carcinoma of the right breast with disease in the left internal mammary nodal region. Dr. Lisbeth Renshaw discusses the pathology findings and agrees that we can proceed while she's still proceeding with her biopsy on Tuesday next week which will help Dr. Burr Medico to determine next steps. Dr. Lisbeth Renshaw recommends a course of stereotactic body radiotherapy (SBRT) to the left internal mammary node with curative intent. We discussed the risks, benefits, short, and April term effects of radiotherapy, and the patient is interested in proceeding. Dr. Lisbeth Renshaw discusses the delivery and  logistics of radiotherapy and anticipates a course of 5 fractions of radiotherapy. We will see her today for simulation and will sign written consent at that time.  2. 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 The patient has met with the PT folks in [place], but I did suggest she meet with Leone Payor to make sure there isn't any other therapy she hasn't already tried that may help. 3. Arthralgias and history of arthritic changes of the hip. I encouraged her to consider an evaluation with an orthopedic physician and she is in agreement with this.    Given current concerns for patient exposure during the COVID-19 pandemic, this encounter was conducted via telephone.  The patient has given verbal consent for this type of encounter. The time spent during this encounter was 38 minutes and 50% of that time was spent in the coordination of her care. The attendants for this meeting include Dr. Lisbeth Renshaw, Shona Simpson, [facility] and [name]  During the encounter, Dr. Lisbeth Renshaw and Shona Simpson [facility] were located at [facility] Radiation Oncology Department.  [name]  was located at home.     The above documentation reflects my direct findings during this shared patient visit. Please see the separate note by Dr. Lisbeth Renshaw on this date for the remainder of the patient's plan of care.    Carola Rhine, PAC

## 2019-12-20 ENCOUNTER — Ambulatory Visit: Payer: BC Managed Care – PPO | Attending: Radiation Oncology | Admitting: Physical Therapy

## 2019-12-20 ENCOUNTER — Other Ambulatory Visit: Payer: Self-pay

## 2019-12-20 ENCOUNTER — Other Ambulatory Visit: Payer: Self-pay | Admitting: Radiology

## 2019-12-20 DIAGNOSIS — M25511 Pain in right shoulder: Secondary | ICD-10-CM | POA: Diagnosis not present

## 2019-12-20 DIAGNOSIS — I972 Postmastectomy lymphedema syndrome: Secondary | ICD-10-CM | POA: Diagnosis not present

## 2019-12-20 DIAGNOSIS — G8929 Other chronic pain: Secondary | ICD-10-CM | POA: Insufficient documentation

## 2019-12-20 DIAGNOSIS — M6281 Muscle weakness (generalized): Secondary | ICD-10-CM | POA: Diagnosis not present

## 2019-12-20 NOTE — Therapy (Signed)
[place], [place], [address] Phone: [phone]   Fax:  [phone]  Physical Therapy Evaluation  Patient Details  Name: April Orozco MRN: [id number] Date of Birth: Jun 21, 1966 Referring Provider (PT): Worthy Flank    Encounter Date: 12/20/2019  PT End of Session - 12/20/19 1242    Visit Number  1    Number of Visits  17    Date for PT Re-Evaluation  02/17/20   prolonged auth period due to radiation and copay   PT Start Time  1100    PT Stop Time  1145    PT Time Calculation (min)  45 min    Activity Tolerance  Patient tolerated treatment well    Behavior During Therapy  [facility] for tasks assessed/performed       Past Medical History:  Diagnosis Date  . Fracture, sacrum/coccyx ([place]) 1970   "Shattered tailbone"  . Heart murmur    [facility] has evaluated, felt benign  . Malignant neoplasm of lower-outer quadrant of right female breast ([place]) 09/13/2016  . Tachycardia    Intermittent, resolved with decreased caffeine intake    Past Surgical History:  Procedure Laterality Date  . APPENDECTOMY    . AXILLARY LYMPH NODE DISSECTION Right 04/23/2017   Procedure: RIGHT AXILLARY LYMPH NODE DISSECTION;  Surgeon: Rolm Bookbinder, MD;  Location: [place];  Service: General;  Laterality: Right;  . BREAST LUMPECTOMY WITH AXILLARY LYMPH NODE BIOPSY Right 04/23/2017  . BREAST RECONSTRUCTION WITH PLACEMENT OF TISSUE EXPANDER AND FLEX HD (ACELLULAR HYDRATED DERMIS) Bilateral 04/07/2017   Procedure: BILATERAL BREAST RECONSTRUCTION WITH PLACEMENT OF TISSUE EXPANDER AND FLEX HD (ACELLULAR HYDRATED DERMIS);  Surgeon: Crissie Reese, MD;  Location: [facility];  Service: Plastics;  Laterality: Bilateral;  . IR GENERIC HISTORICAL  01/10/2017   IR CV LINE INJECTION 01/10/2017 Greggory Keen, MD [facility]-INTER[facility] RAD  . MASTECTOMY    . MASTECTOMY WITH RADIOACTIVE SEED GUIDED EXCISION AND AXILLARY SENTINEL LYMPH NODE BIOPSY  Bilateral 04/07/2017   Procedure: RIGHT SKIN SPARING MASTECTOMY WITH RADIOACTIVE SEED GUIDED AXILLARY LYMPH NODE EXCISION AND AXILLARY SENTINEL LYMPH NODE BIOPSY, LEFT PROPHYLACTIC SKIN SPARING MASTECTOMY;  Surgeon: Rolm Bookbinder, MD;  Location: [place];  Service: General;  Laterality: Bilateral;  . PORT-A-CATH REMOVAL N/A 04/07/2017   Procedure: REMOVAL PORT-A-CATH;  Surgeon: Rolm Bookbinder, MD;  Location: [facility];  Service: General;  Laterality: N/A;  . PORTACATH PLACEMENT Right 09/23/2016   Procedure: INSERTION PORT-A-CATH WITH [place];  Surgeon: Rolm Bookbinder, MD;  Location: [facility];  Service: General;  Laterality: Right;  . TONSILECTOMY/ADENOIDECTOMY WITH MYRINGOTOMY    . TUBAL LIGATION      There were no vitals filed for this visit.   Subjective Assessment - 12/20/19 1109    Subjective  Pt has lymphedema in right trunk that gets worse with pain and heaviness by the end of the day  She also has alot of tightness in the right side    Pertinent History  right breast cancer in 2018 with chemo,double  mastectomy 5/18 2018 with 6 nodes removed on right, with 8 more nodes on right removed on 04/23/2017.  Pt with immediate placement of tissue expanders, but ultmately had the right tissue expander removed. She developed lymphedema in her right  arm. and trunk and received PT in [place] for it at that time. She has daytime sleeve and a compression pump for the arm only . Stil has expander on the left side, but had a recurrance of  cancer on the left side in internal mammary lymph node. . She plans to have recontstruction. She will have another biopsy for the left side tomrrow and will have radiation starting in February . 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  No/denies         [facility] PT Assessment - 12/20/19 0001      Assessment   Medical Diagnosis  right postmastectomy lymphedma     Referring Provider (PT)  Worthy Flank     Onset Date/Surgical Date  12/19/16    Hand Dominance  Right      Precautions   Precautions  Other (comment)    Precaution Comments  at risk for lymphedema       Restrictions   Weight Bearing Restrictions  No      Balance Screen   Has the patient fallen in the past 6 months  No    Has the patient had a decrease in activity level because of a fear of falling?   No    Is the patient reluctant to leave their home because of a fear of falling?   No      Home Film/video editor residence      Prior Function   Level of Independence  Independent    Vocation  Full time employment    Vocation Requirements  works as a Marine scientist in [place]  tries to exercise on a regular basis but it makes her side swell up       Cognition   Overall Cognitive Status  Within Functional Limits for tasks assessed      Observation/Other Assessments   Observations  pt with visible fullness in right upper arm and lateral chest.  Scar on right chest is adhered to chest wall with full tissue around it. Her Right shoulder appears to be foward  expander in place on left chest wall with markers for upcoming radiation     Skin Integrity  no open areas    Other Surveys   Quick Dash   lymphedema life impact scale 61.76   Quick DASH   38.64   lymphedema life impact scale 61.76     Sensation   Light Touch  Not tested      Coordination   Gross Motor Movements are Fluid and Coordinated  Yes      Posture/Postural Control   Posture/Postural Control  Postural limitations    Postural Limitations  Rounded Shoulders;Forward head    Posture Comments  right shoulder more forward than left       ROM / Strength   AROM / PROM / Strength  AROM;Strength      AROM   Overall AROM   Within functional limits for  tasks performed;Unable to assess    Right/Left Shoulder  Right;Left    Right Shoulder Flexion  165 Degrees    Right Shoulder ABduction  168 Degrees      Strength   Overall Strength  Deficits    Overall Strength Comments  pt unable to hold right arm overhead due to pain, appears to have generalized deconditioing,       Special Tests  Special Tests  Rotator Cuff Impingement    Rotator Cuff Impingment tests  Neer impingement test;Hawkins- Kennedy test      Neer Impingement test    Findings  Negative    Side  Right      Hawkins-Kennedy test   Findings  Negative    Side  Right        LYMPHEDEMA/ONCOLOGY QUESTIONNAIRE - 12/20/19 1125      Type   Cancer Type  right and left breast cancer   (Pended)       Treatment   Past Chemotherapy Treatment  Yes  (Pended)     Past Radiation Treatment  Yes  (Pended)       What other symptoms do you have   Are you Having Heaviness or Tightness  Yes  (Pended)     Are you having Pain  Yes  (Pended)    in shoulder , worse at the end of the day    Are you having pitting edema  No  (Pended)     Do you have infections  No  (Pended)     Is there Decreased scar mobility  Yes  (Pended)       Lymphedema Assessments   Lymphedema Assessments  Upper extremities  (Pended)       Right Upper Extremity Lymphedema   10 cm Proximal to Olecranon Process  34 cm  (Pended)     Olecranon Process  28 cm  (Pended)     15 cm Proximal to Ulnar Styloid Process  28 cm  (Pended)     10 cm Proximal to Ulnar Styloid Process  25 cm  (Pended)     Just Proximal to Ulnar Styloid Process  16.5 cm  (Pended)     Across Hand at [employer]  20 cm  (Pended)     At [place] of 2nd Digit  6.5 cm  (Pended)       Left Upper Extremity Lymphedema   10 cm Proximal to Olecranon Process  30.8 cm  (Pended)     Olecranon Process  27 cm  (Pended)     15 cm Proximal to Ulnar Styloid Process  27 cm  (Pended)     10 cm Proximal to Ulnar Styloid Process  24 cm  (Pended)     Just  Proximal to Ulnar Styloid Process  16 cm  (Pended)     Across Hand at [employer]  19.5 cm  (Pended)     At [place] of 2nd Digit  6.5 cm  (Pended)           Quick Dash - 12/20/19 0001    Open a tight or new jar  Moderate difficulty    Do heavy household chores (wash walls, wash floors)  Moderate difficulty    Carry a shopping bag or briefcase  Mild difficulty    Wash your back  Mild difficulty    Use a knife to cut food  Mild difficulty    Recreational activities in which you take some force or impact through your arm, shoulder, or hand (golf, hammering, tennis)  Moderate difficulty    During the past week, to what extent has your arm, shoulder or hand problem interfered with your normal social activities with family, friends, neighbors, or groups?  Modererately    During the past week, to what extent has your arm, shoulder or hand problem limited your work or other regular daily activities  Slightly    Arm,  shoulder, or hand pain.  Moderate    Tingling (pins and needles) in your arm, shoulder, or hand  Mild    Difficulty Sleeping  Moderate difficulty    DASH Score  38.64 %        [facility]  Lymphedema Life Impact Scale Total Score  61.76 %      Objective measurements completed on examination: See above findings.                PT Short Term Goals - 12/20/19 1253      PT SHORT TERM GOAL #1   Title  Pt will be measured for garments so that she can control her lymphedema at hom    Time  4    Period  Weeks    Status  New        PT Long Term Goals - 12/20/19 1254      PT LONG TERM GOAL #1   Title  Pt will report that pain and heaviness in her right arm at the end of the day are decreased by 50%    Time  8    Period  Weeks    Status  New      PT LONG TERM GOAL #2   Title  Pt will decrease Quick DASH to < 20 indicating a functional improvmemt of Right UE    Baseline  38.64    Time  8     Period  Weeks    Status  New      PT LONG TERM GOAL #4   Title  Pt will be independent in a home exercise program for strength    Time  8    Period  Weeks    Status  New             Plan - 12/20/19 1243    Clinical Impression Statement  April Orozco comes to PT with longstanding problems with right upper quadrant lymphedema and shoulder pain. 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  She will be starting radiation therapy.  She has a compression pump and daytime sleeve that are not adequate to control her lymphedema especially in her trunk chest and back.  She has more lymphedema in her proximal arm than her distal arm so getting the right  compression garments will be critical. She might best tolerate a [place] night jacket with one sleeve Will need to explore options for daytime garments and she has appt to see fitter this Friday after her PT appt.  She will call back with brand of her compression pump so we can see if she just needs another jacket piece vs, another pump.    Personal Factors and Comorbidities  Comorbidity 3+    Comorbidities  14 nodes removed, previous radiation, new onset of cancer on left side, large copay    Examination-Activity Limitations  Reach Overhead    Examination-Participation Restrictions  Other   lives far away   Stability/Clinical Decision Making  Evolving/Moderate complexity    Clinical Decision Making  Moderate    Rehab Potential  Good    PT Frequency  2x / week    PT Duration  8 weeks    PT Treatment/Interventions  ADLs/Self Care Home Management;Therapeutic activities;Therapeutic exercise;Patient/family education;Manual lymph drainage;Passive range of motion;Scar mobilization;Compression bandaging;Taping;Vasopneumatic Device    PT Next Visit Plan  manual lymph drainage , begin scapular and shoulder strengthening  program. Determine garments for pt to be measured by Melissa on Friday at 11:45     Consulted and Agree with Plan of Care  Patient       Patient will benefit from skilled therapeutic intervention in order to improve the following deficits and impairments:  Decreased knowledge of use of DME, Decreased knowledge of precautions, Pain, Increased edema, Impaired perceived functional ability, Increased fascial restricitons, Impaired UE functional use, Postural dysfunction, Decreased scar mobility, Decreased range of motion  Visit Diagnosis: Postmastectomy lymphedema - Plan: PT plan of care cert/re-cert  Chronic pain in right shoulder - Plan: PT plan of care cert/re-cert  Muscle weakness (generalized) - Plan: PT plan of care cert/re-cert     Problem List Patient Active Problem List   Diagnosis Date Noted  . Acute left-sided low back pain without sciatica 05/29/2017  . Menopausal syndrome (hot flushes) 01/23/2017  . Genetic testing 11/22/2016  . Neutropenia ([place]) 11/22/2016  . Angular cheilosis 11/22/2016  . Nausea with vomiting 11/13/2016  . Night sweats 11/13/2016  . Anxiety 11/01/2016  . Insomnia 11/01/2016  . Port catheter in place 11/01/2016  . Family history of breast cancer in female 10/24/2016  . Malignant neoplasm of lower-outer quadrant of right female breast ([place]) 09/13/2016  . Fracture, sacrum/coccyx ([place])    Donato Heinz. Owens Shark PT   Norwood Levo 12/20/2019, 1:02 PM  [place], [place], [address] Phone: [phone]   Fax:  [phone]  Name: April Orozco MRN: [id number] Date of Birth: [birth year]

## 2019-12-21 ENCOUNTER — Other Ambulatory Visit: Payer: Self-pay

## 2019-12-21 ENCOUNTER — Ambulatory Visit (HOSPITAL_COMMUNITY)
Admission: RE | Admit: 2019-12-21 | Discharge: 2019-12-21 | Disposition: A | Discharge status: Home / Self Care | Payer: BC Managed Care – PPO | Source: Ambulatory Visit | Attending: Hematology | Admitting: Hematology

## 2019-12-21 ENCOUNTER — Ambulatory Visit (HOSPITAL_COMMUNITY)
Admission: RE | Admit: 2019-12-21 | Discharge: 2019-12-21 | Disposition: A | Discharge status: Home / Self Care | Payer: BC Managed Care – PPO | Source: Ambulatory Visit | Attending: Interventional Radiology | Admitting: Interventional Radiology

## 2019-12-21 ENCOUNTER — Other Ambulatory Visit: Payer: Self-pay | Admitting: Hematology

## 2019-12-21 ENCOUNTER — Encounter (HOSPITAL_COMMUNITY): Payer: Self-pay

## 2019-12-21 DIAGNOSIS — Z853 Personal history of malignant neoplasm of breast: Secondary | ICD-10-CM | POA: Diagnosis not present

## 2019-12-21 DIAGNOSIS — Z9889 Other specified postprocedural states: Secondary | ICD-10-CM | POA: Insufficient documentation

## 2019-12-21 DIAGNOSIS — R590 Localized enlarged lymph nodes: Secondary | ICD-10-CM | POA: Diagnosis not present

## 2019-12-21 DIAGNOSIS — C50511 Malignant neoplasm of lower-outer quadrant of right female breast: Secondary | ICD-10-CM | POA: Diagnosis not present

## 2019-12-21 DIAGNOSIS — Z170 Estrogen receptor positive status [ER+]: Secondary | ICD-10-CM | POA: Diagnosis not present

## 2019-12-21 DIAGNOSIS — R918 Other nonspecific abnormal finding of lung field: Secondary | ICD-10-CM | POA: Diagnosis not present

## 2019-12-21 MED ORDER — FENTANYL CITRATE (PF) 100 MCG/2ML IJ SOLN
INTRAMUSCULAR | Status: AC | PRN
  Administered 2019-12-21: 25 ug via INTRAVENOUS
  Administered 2019-12-21: 50 ug via INTRAVENOUS
  Administered 2019-12-21: 25 ug via INTRAVENOUS

## 2019-12-21 MED ORDER — MIDAZOLAM HCL 2 MG/2ML IJ SOLN
INTRAMUSCULAR | Status: AC | PRN
  Administered 2019-12-21: 0.5 mg via INTRAVENOUS
  Administered 2019-12-21: 1 mg via INTRAVENOUS
  Administered 2019-12-21: 0.5 mg via INTRAVENOUS

## 2019-12-21 MED ORDER — FENTANYL CITRATE (PF) 100 MCG/2ML IJ SOLN
INTRAMUSCULAR | Status: AC
  Filled 2019-12-21: qty 2

## 2019-12-21 MED ORDER — SODIUM CHLORIDE 0.9 % IV SOLN: INTRAVENOUS | Status: DC

## 2019-12-21 MED ORDER — MIDAZOLAM HCL 2 MG/2ML IJ SOLN
INTRAMUSCULAR | Status: AC
  Filled 2019-12-21: qty 2

## 2019-12-21 NOTE — Procedures (Signed)
Interventional Radiology Procedure Note  Procedure: CT guided biopsy of LEFT internal mammary LN.   Complications: None  Estimated Blood Loss: None  Recommendations: - CXR in 1 hr - DC home   Signed,  Criselda Peaches, MD

## 2019-12-21 NOTE — H&P (Addendum)
Chief Complaint: Patient was seen in consultation today for  left internal mammary lymph node biopsy  at the request of Feng,Yan  Referring Physician(s): Feng,Yan  Supervising Physician: Jacqulynn Cadet  Patient Status: [facility] - Out-pt  History of Present Illness: April Orozco is a 54 y.o. female   Dx Rt Breast C 2017 Rt mastectomy 2018  CT 11/22/19 showing enlarged L Int Mammary LAN +PET This islikely distant metastasis then local recurrence since her initial breast cancer was on right.No evidence of otherdistant metastasis. Per Dr Burr Medico  Bx Lt Int Mammary LN 12/06/19: FINAL MICROSCOPIC DIAGNOSIS:  - Atypical cells present   DIAGNOSTIC COMMENTS:  The atypical cells observed are concerning for a more serious process.  The atypical cells are not present in the cell block material thereby  precluding further evaluation with immunohistochemistry. Recommend  clinical correlation and further evaluation as indicated.   For repeat biopsy today for additional samples- per Dr Burr Medico to determine next steps To start radiation treatment next week   Past Medical History:  Diagnosis Date  . Fracture, sacrum/coccyx ([place]) 1970   "Shattered tailbone"  . Heart murmur    SE Cardiovascular has evaluated, felt benign  . 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 Tachycardia    Intermittent, resolved with decreased caffeine intake    Past Surgical History:  Procedure Laterality Date  . APPENDECTOMY    . AXILLARY LYMPH NODE DISSECTION Right 04/23/2017   Procedure: RIGHT AXILLARY LYMPH NODE DISSECTION;  Surgeon: Rolm Bookbinder, MD;  Location: [place];  Service: General;  Laterality: Right;  . BREAST LUMPECTOMY WITH AXILLARY LYMPH NODE BIOPSY Right 04/23/2017  . BREAST RECONSTRUCTION WITH PLACEMENT OF TISSUE EXPANDER AND FLEX HD (ACELLULAR HYDRATED DERMIS) Bilateral 04/07/2017   Procedure: BILATERAL BREAST RECONSTRUCTION WITH PLACEMENT OF  TISSUE EXPANDER AND FLEX HD (ACELLULAR HYDRATED DERMIS);  Surgeon: Crissie Reese, MD;  Location: [place];  Service: Plastics;  Laterality: Bilateral;  . IR GENERIC HISTORICAL  01/10/2017   IR CV LINE INJECTION 01/10/2017 Greggory Keen, MD [facility]-INTERV RAD  . MASTECTOMY    . MASTECTOMY WITH RADIOACTIVE SEED GUIDED EXCISION AND AXILLARY SENTINEL LYMPH NODE BIOPSY Bilateral 04/07/2017   Procedure: RIGHT SKIN SPARING MASTECTOMY WITH RADIOACTIVE SEED GUIDED AXILLARY LYMPH NODE EXCISION AND AXILLARY SENTINEL LYMPH NODE BIOPSY, LEFT PROPHYLACTIC SKIN SPARING MASTECTOMY;  Surgeon: Rolm Bookbinder, MD;  Location: [place];  Service: General;  Laterality: Bilateral;  . PORT-A-CATH REMOVAL N/A 04/07/2017   Procedure: REMOVAL PORT-A-CATH;  Surgeon: Rolm Bookbinder, MD;  Location: [place];  Service: General;  Laterality: N/A;  . PORTACATH PLACEMENT Right 09/23/2016   Procedure: INSERTION PORT-A-CATH WITH [place];  Surgeon: Rolm Bookbinder, MD;  Location: [place];  Service: General;  Laterality: Right;  . TONSILECTOMY/ADENOIDECTOMY WITH MYRINGOTOMY    . TUBAL LIGATION      Allergies: Banana, Sulfur, and Tramadol  Medications: Prior to Admission medications   Medication Sig Start Date End Date Taking? Authorizing Provider  Folic Acid-Vit [id number] [id number] (FOLBEE) 2.5-25-1 MG TABS tablet Take 1 tablet by mouth daily.   Yes [provider]  gabapentin (NEURONTIN) 300 MG capsule TAKE TWO CAPSULES BY MOUTH AT BEDTIME 07/14/19  Yes Truitt Merle, MD  tamoxifen (NOLVADEX) 20 MG tablet Take 1 tablet (20 mg total) by mouth daily. 09/22/17  Yes Alla Feeling, NP  zolpidem (AMBIEN) 5 MG tablet Take 1 tablet (5 mg total) by mouth at bedtime as needed for sleep. 10/11/19  Yes Truitt Merle,  MD  exemestane (AROMASIN) 25 MG tablet Take 1 tablet (25 mg total) by mouth daily after breakfast. Patient not taking: Reported on 12/14/2019 10/11/19   Truitt Merle, MD  ibuprofen (ADVIL,MOTRIN) 200 MG tablet Take 800 mg by mouth  every 6 (six) hours as needed for headache, mild pain or moderate pain.    [provider]     Family History  Problem Relation Age of Onset  . Diabetes Mother   . Diabetes Father   . Skin cancer Father 68       NOS type; worked as a Theme park manager for many years  . Hypertension Other   . Breast cancer Cousin 40       paternal 1st cousin; w/ mets to bone  . Breast cancer Cousin        paternal 1st cousin dx 83-50; s/p lump  . Breast cancer Cousin        paternal 1st cousin dx 29-53 w/ mets to LN; s/p BL mastectomies  . Heart disease Maternal Aunt 58  . Colon cancer Paternal Uncle        dx 67-68  . Goiter Maternal Grandmother        d. bled to death following goiter surgery  . Heart attack Maternal Grandfather        d. early 70s  . Lung cancer Paternal Grandmother        dx 28s; d. 88y  . Diabetes Paternal Grandmother   . Heart failure Paternal Grandfather        d. 75-76  . Alzheimer's disease Paternal Grandfather   . Other Daughter 93       daughter w/ hx benign breast lump/cyst removed  . Epilepsy Maternal Aunt   . Alzheimer's disease Paternal Aunt   . Brain cancer Cousin 72       paternal 1st cousin; d. 42y; NOS type    Social History   Socioeconomic History  . Marital status: Widowed    Spouse name: Not on file  . Number of children: 5  . Years of education: Not on file  . Highest education level: Not on file  Occupational History  . Occupation: Programmer, multimedia: [employer]    Comment: OR trauma nurse  Tobacco Use  . Smoking status: Never Smoker  . Smokeless tobacco: Never Used  Substance and Sexual Activity  . Alcohol use: Yes    Comment: social drinker   . Drug use: No  . Sexual activity: Not on file  Other Topics Concern  . Not on file  Social History Narrative  . Not on file   Social Determinants of Health   Financial Resource Strain:   . Difficulty of Paying Living Expenses: Not on file  Food Insecurity:   . Worried About Sales executive in the Last Year: Not on file  . Ran Out of Food in the Last Year: Not on file  Transportation Needs:   . Lack of Transportation (Medical): Not on file  . Lack of Transportation (Non-Medical): Not on file  Physical Activity:   . Days of Exercise per Week: Not on file  . Minutes of Exercise per Session: Not on file  Stress:   . Feeling of Stress : Not on file  Social Connections:   . Frequency of Communication with Friends and Family: Not on file  . Frequency of Social Gatherings with Friends and Family: Not on file  . Attends Religious Services: Not on file  .  Active Member of Clubs or Organizations: Not on file  . Attends Archivist Meetings: Not on file  . Marital Status: Not on file     Review of Systems: A 12 point ROS discussed and pertinent positives are indicated in the HPI above.  All other systems are negative.  Review of Systems  Constitutional: Negative for activity change, fatigue and fever.  Respiratory: Negative for cough and shortness of breath.   Cardiovascular: Negative for chest pain.  Psychiatric/Behavioral: Negative for behavioral problems and confusion.    Vital Signs: BP (!) 117/49   Pulse 75   Temp 98.3 F (36.8 C) (Oral)   Resp 16   Ht 5\' 4"  (1.626 m)   Wt 154 lb (69.9 kg)   SpO2 98%   BMI 26.43 kg/m   Physical Exam Vitals reviewed.  Cardiovascular:     Rate and Rhythm: Regular rhythm.     Heart sounds: Normal heart sounds.  Pulmonary:     Effort: Pulmonary effort is normal.     Breath sounds: Normal breath sounds.  Abdominal:     Palpations: Abdomen is soft.  Musculoskeletal:        General: Normal range of motion.  Skin:    General: Skin is warm and dry.  Neurological:     Mental Status: She is alert and oriented to person, place, and time.  Psychiatric:        Mood and Affect: Mood normal.        Behavior: Behavior normal.        Thought Content: Thought content normal.        Judgment: Judgment normal.      Imaging: DG Chest 1 View  Result Date: 12/06/2019 CLINICAL DATA:  Status post biopsy of hypermetabolic left internal mammary node on PET scan. EXAM: CHEST  1 VIEW COMPARISON:  Chest x-ray dated 10/05/2016 and chest CT dated 10/27/2019 FINDINGS: The heart size and pulmonary vascularity are normal and the lungs are clear. No pneumothorax after left internal mammary node biopsy. No effusions. Breast expander in the left breast. No bone abnormality. IMPRESSION: No pneumothorax after left internal mammary node biopsy. No significant abnormalities. Electronically Signed   By: Lorriane Shire M.D.   On: 12/06/2019 10:28   [place] FNA SOFT TISSUE  Result Date: 12/14/2019 CLINICAL DATA:  Hypermetabolic left internal mammary lymph node on recent PET-CT. History of breast carcinoma. EXAM: ULTRASOUND GUIDED FNA BIOPSY OF LEFT INTERNAL MAMMARY LYMPH NODE MEDICATIONS: Intravenous Fentanyl 31mcg and Versed 1mg  were administered as conscious sedation during continuous monitoring of the patient's level of consciousness and physiological / cardiorespiratory status by the radiology RN, with a total moderate sedation time of 21 minutes. PROCEDURE: The procedure, risks, benefits, and alternatives were explained to the patient. Questions regarding the procedure were encouraged and answered. The patient understands and consents to the procedure. Using an intercostal anterior approach, the left ir mammary lymph node was identified on ultrasound. Appropriate skin entry site determined and marked. The operative field was prepped with chlorhexidine in a sterile fashion, and a sterile drape was applied covering the operative field. A sterile gown and sterile gloves were used for the procedure. Local anesthesia was provided with 1% Lidocaine. Under real-time ultrasound guidance, multiple FNA biopsies were obtained with and red needles, submitted to cytopathology. The patient tolerated the procedure well. COMPLICATIONS: None. FINDINGS:  Enlarged left internal mammary lymph node identified. FNA biopsy samples obtained as above. IMPRESSION: 1. Technically successful ultrasound-guided FNA biopsy, left internal mammary  lymph node. 2. Follow-up chest x-ray negative for pneumothorax. Electronically Signed   By: Lucrezia Europe M.D.   On: 12/14/2019 07:17    Labs:  CBC: Recent Labs    10/11/19 1358 12/06/19 0654  WBC 5.8 9.1  HGB 12.8 13.4  HCT 38.9 41.1  PLT 241 278    COAGS: Recent Labs    12/06/19 0654  INR 0.9    BMP: Recent Labs    10/11/19 1358  NA 141  K 3.5  CL 102  CO2 29  GLUCOSE 109*  BUN 10  CALCIUM 9.3  CREATININE 0.76  GFRNONAA >60  GFRAA >60    LIVER FUNCTION TESTS: Recent Labs    10/11/19 1358  BILITOT 0.2*  AST 20  ALT 19  ALKPHOS 101  PROT 8.3*  ALBUMIN 4.0    TUMOR MARKERS: No results for input(s): AFPTM, CEA, CA199, CHROMGRNA in the last 8760 hours.  Assessment and Plan:  Hx Rt Breat Ca 2017 Mastectomy 2018 New left internal mammary LN; +PET Initial bx of this node 12/06/19: atypical cells For repeat bx for additional samples today Risks and benefits of left internal mammary Lymph node biopsy was discussed with the patient and/or patient's family including, but not limited to bleeding, infection, damage to adjacent structures or low yield requiring additional tests.  All of the questions were answered and there is agreement to proceed.  Consent signed and in chart.  Thank you for this interesting consult.  I greatly enjoyed meeting TIMESHIA SCHELLIN and look forward to participating in their care.  A copy of this report was sent to the requesting provider on this date.  Electronically Signed: Lavonia Drafts, PA-C 12/21/2019, 7:10 AM   I spent a total of    25 Minutes in face to face in clinical consultation, greater than 50% of which was counseling/coordinating care for L Int Mammary LN bx

## 2019-12-21 NOTE — Discharge Instructions (Signed)

## 2019-12-21 NOTE — Discharge Instructions (Signed)
Needle Biopsy, Care After This sheet gives you information about how to care for yourself after your procedure. Your health care provider may also give you more specific instructions. If you have problems or questions, contact your health care provider. What can I expect after the procedure? After the procedure, it is common to have soreness, bruising, or mild pain at the puncture site. This should go away in a few days. Follow these instructions at home: Needle insertion site care   Wash your hands with soap and water before you change your bandage (dressing). If you cannot use soap and water, use hand sanitizer.  Follow instructions from your health care provider about how to take care of your puncture site. This includes: ? When and how to change your dressing. ? When to remove your dressing.  Check your puncture site every day for signs of infection. Check for: ? Redness, swelling, or pain. ? Fluid or blood. ? Pus or a bad smell. ? Warmth. General instructions  Return to your normal activities as told by your health care provider. Ask your health care provider what activities are safe for you.  Do not take baths, swim, or use a hot tub until your health care provider approves. Ask your health care provider if you may take showers. You may only be allowed to take sponge baths.  Take over-the-counter and prescription medicines only as told by your health care provider.  Keep all follow-up visits as told by your health care provider. This is important. Contact a health care provider if:  You have a fever.  You have redness, swelling, or pain at the puncture site that lasts longer than a few days.  You have fluid, blood, or pus coming from your puncture site.  Your puncture site feels warm to the touch. Get help right away if:  You have severe bleeding from the puncture site. Summary  After the procedure, it is common to have soreness, bruising, or mild pain at the puncture  site. This should go away in a few days.  Check your puncture site every day for signs of infection, such as redness, swelling, or pain.  Get help right away if you have severe bleeding from your puncture site. This information is not intended to replace advice given to you by your health care provider. Make sure you discuss any questions you have with your health care provider. Document Revised: 01/23/2018 Document Reviewed: 11/24/2017 Elsevier Patient Education  2020 Elsevier Inc. Moderate Conscious Sedation, Adult Sedation is the use of medicines to promote relaxation and relieve discomfort and anxiety. Moderate conscious sedation is a type of sedation. Under moderate conscious sedation, you are less alert than normal, but you are still able to respond to instructions, touch, or both. Moderate conscious sedation is used during short medical and dental procedures. It is milder than deep sedation, which is a type of sedation under which you cannot be easily woken up. It is also milder than general anesthesia, which is the use of medicines to make you unconscious. Moderate conscious sedation allows you to return to your regular activities sooner. Tell a health care provider about:  Any allergies you have.  All medicines you are taking, including vitamins, herbs, eye drops, creams, and over-the-counter medicines.  Use of steroids (by mouth or creams).  Any problems you or family members have had with sedatives and anesthetic medicines.  Any blood disorders you have.  Any surgeries you have had.  Any medical conditions you have, such   as sleep apnea.  Whether you are pregnant or may be pregnant.  Any use of cigarettes, alcohol, marijuana, or street drugs. What are the risks? Generally, this is a safe procedure. However, problems may occur, including:  Getting too much medicine (oversedation).  Nausea.  Allergic reaction to medicines.  Trouble breathing. If this happens, a  breathing tube may be used to help with breathing. It will be removed when you are awake and breathing on your own.  Heart trouble.  Lung trouble. What happens before the procedure? Staying hydrated Follow instructions from your health care provider about hydration, which may include:  Up to 2 hours before the procedure - you may continue to drink clear liquids, such as water, clear fruit juice, black coffee, and plain tea. Eating and drinking restrictions Follow instructions from your health care provider about eating and drinking, which may include:  8 hours before the procedure - stop eating heavy meals or foods such as meat, fried foods, or fatty foods.  6 hours before the procedure - stop eating light meals or foods, such as toast or cereal.  6 hours before the procedure - stop drinking milk or drinks that contain milk.  2 hours before the procedure - stop drinking clear liquids. Medicine Ask your health care provider about:  Changing or stopping your regular medicines. This is especially important if you are taking diabetes medicines or blood thinners.  Taking medicines such as aspirin and ibuprofen. These medicines can thin your blood. Do not take these medicines before your procedure if your health care provider instructs you not to.  Tests and exams  You will have a physical exam.  You may have blood tests done to show: ? How well your kidneys and liver are working. ? How well your blood can clot. General instructions  Plan to have someone take you home from the hospital or clinic.  If you will be going home right after the procedure, plan to have someone with you for 24 hours. What happens during the procedure?  An IV tube will be inserted into one of your veins.  Medicine to help you relax (sedative) will be given through the IV tube.  The medical or dental procedure will be performed. What happens after the procedure?  Your blood pressure, heart rate,  breathing rate, and blood oxygen level will be monitored often until the medicines you were given have worn off.  Do not drive for 24 hours. This information is not intended to replace advice given to you by your health care provider. Make sure you discuss any questions you have with your health care provider. Document Revised: 10/24/2017 Document Reviewed: 03/02/2016 Elsevier Patient Education  2020 Elsevier Inc.  

## 2019-12-22 ENCOUNTER — Other Ambulatory Visit (HOSPITAL_COMMUNITY): Payer: BC Managed Care – PPO

## 2019-12-22 ENCOUNTER — Ambulatory Visit (HOSPITAL_COMMUNITY): Payer: BC Managed Care – PPO

## 2019-12-23 LAB — SURGICAL PATHOLOGY

## 2019-12-24 ENCOUNTER — Other Ambulatory Visit: Payer: Self-pay

## 2019-12-24 ENCOUNTER — Ambulatory Visit: Payer: BC Managed Care – PPO

## 2019-12-24 ENCOUNTER — Telehealth: Payer: Self-pay | Admitting: Hematology

## 2019-12-24 ENCOUNTER — Other Ambulatory Visit: Payer: Self-pay | Admitting: Hematology

## 2019-12-24 DIAGNOSIS — M25511 Pain in right shoulder: Secondary | ICD-10-CM | POA: Diagnosis not present

## 2019-12-24 DIAGNOSIS — G8929 Other chronic pain: Secondary | ICD-10-CM

## 2019-12-24 DIAGNOSIS — M6281 Muscle weakness (generalized): Secondary | ICD-10-CM

## 2019-12-24 DIAGNOSIS — I972 Postmastectomy lymphedema syndrome: Secondary | ICD-10-CM

## 2019-12-24 DIAGNOSIS — Z170 Estrogen receptor positive status [ER+]: Secondary | ICD-10-CM

## 2019-12-24 DIAGNOSIS — C50511 Malignant neoplasm of lower-outer quadrant of right female breast: Secondary | ICD-10-CM

## 2019-12-24 NOTE — Telephone Encounter (Signed)
I called pt and discussed her second biopsy result, it was multiple good core biopsy, negative for malignant cell, and normal lymphoid tissue present. I think the biopsy was adequate and better than the first FNA. I discussed with Dr. Lisbeth Renshaw, and we decide to hold on her SBRT, and monitor it with a f/u PET or CT in 2 months (3 months from last scan).  I explained to pt the hypermetabolic left internal mammary lymph node could be reactive, will continue monitoring.  I encouraged her to continue tamoxifen.  She was quite relieved.  She plans to take turmeric to see if it improves her hot flash and joint pain from tamoxifen.  I will see her back in 2 months after PET scan lab, scheduling message sent.  April Orozco  12/24/2019

## 2019-12-24 NOTE — Therapy (Signed)
[place], [place], [address] Phone: [phone]   Fax:  [phone]  Physical Therapy Treatment  Patient Details  Name: April Orozco MRN: [id number] Date of Birth: 09/18/1966 Referring Provider (PT): Worthy Flank    Encounter Date: 12/24/2019  PT End of Session - 12/24/19 1105    Visit Number  2    Number of Visits  17    Date for PT Re-Evaluation  02/17/20    PT Start Time  1105    PT Stop Time  1200    PT Time Calculation (min)  55 min    Activity Tolerance  Patient tolerated treatment well    Behavior During Therapy  [facility] for tasks assessed/performed       Past Medical History:  Diagnosis Date  . Fracture, sacrum/coccyx ([place]) 1970   "Shattered tailbone"  . Heart murmur    [facility] has evaluated, felt benign  . Malignant neoplasm of lower-outer quadrant of right female breast ([place]) 09/13/2016  . Tachycardia    Intermittent, resolved with decreased caffeine intake    Past Surgical History:  Procedure Laterality Date  . APPENDECTOMY    . AXILLARY LYMPH NODE DISSECTION Right 04/23/2017   Procedure: RIGHT AXILLARY LYMPH NODE DISSECTION;  Surgeon: Rolm Bookbinder, MD;  Location: [place];  Service: General;  Laterality: Right;  . BREAST LUMPECTOMY WITH AXILLARY LYMPH NODE BIOPSY Right 04/23/2017  . BREAST RECONSTRUCTION WITH PLACEMENT OF TISSUE EXPANDER AND FLEX HD (ACELLULAR HYDRATED DERMIS) Bilateral 04/07/2017   Procedure: BILATERAL BREAST RECONSTRUCTION WITH PLACEMENT OF TISSUE EXPANDER AND FLEX HD (ACELLULAR HYDRATED DERMIS);  Surgeon: Crissie Reese, MD;  Location: [place];  Service: Plastics;  Laterality: Bilateral;  . IR GENERIC HISTORICAL  01/10/2017   IR CV LINE INJECTION 01/10/2017 Greggory Keen, MD [facility]INTER[facility] RAD  . MASTECTOMY    . MASTECTOMY WITH RADIOACTIVE SEED GUIDED EXCISION AND AXILLARY SENTINEL LYMPH NODE BIOPSY Bilateral 04/07/2017   Procedure: RIGHT SKIN SPARING  MASTECTOMY WITH RADIOACTIVE SEED GUIDED AXILLARY LYMPH NODE EXCISION AND AXILLARY SENTINEL LYMPH NODE BIOPSY, LEFT PROPHYLACTIC SKIN SPARING MASTECTOMY;  Surgeon: Rolm Bookbinder, MD;  Location: [place];  Service: General;  Laterality: Bilateral;  . PORT-A-CATH REMOVAL N/A 04/07/2017   Procedure: REMOVAL PORT-A-CATH;  Surgeon: Rolm Bookbinder, MD;  Location: [place];  Service: General;  Laterality: N/A;  . PORTACATH PLACEMENT Right 09/23/2016   Procedure: INSERTION PORT-A-CATH WITH [place];  Surgeon: Rolm Bookbinder, MD;  Location: [place];  Service: General;  Laterality: Right;  . TONSILECTOMY/ADENOIDECTOMY WITH MYRINGOTOMY    . TUBAL LIGATION      There were no vitals filed for this visit.  Subjective Assessment - 12/24/19 1106    Subjective  Pt states that she has had lymphedema treatment before in [place]. She states that she has a pump at home that she uses and feels that it is tight in her trunk area. She reports that she feels better in the morning and much worse as the day progresses.    Pertinent History  right breast cancer in 2018 with chemo,double  mastectomy 5/18 2018 with 6 nodes removed on right, with 8 more nodes on right removed on 04/23/2017.  Pt with immediate placement of tissue expanders, but ultmately had the right tissue expander removed. She developed lymphedema in her right  arm. and trunk and received PT in [place] for it at that time. She has daytime sleeve and a compression pump for the arm only . Stil has expander  on the left side, but had a recurrance of cancer on the left side in internal mammary lymph node. . She plans to have recontstruction. She will have another biopsy for the left side tomrrow and will have radiation starting in February . 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  No/denies    Pain Score  0-No pain                  Outpatient Rehab from 12/20/2019 in [place]  Lymphedema Life Impact Scale Total Score  61.76 %           [facility] Adult PT Treatment/Exercise - 12/24/19 0001      Manual Therapy   Manual Therapy  Edema management;Manual Lymphatic Drainage (MLD)    Edema Management  Pt was measured by the fitter during her treatment session. Pt was educated by the physical therapist and fitter on multiple options of day/night garments due to edema in her abdomen.     Manual Lymphatic Drainage (MLD)  MLD was performed at the anterior trunk in supine: short neck, swimming in the terminus, Bil shoulders, Bil axillary nodes, Bil inguinal nodes, Bil axillo-inguinal anastomosis, figure 7 to the R side with extra time spent here, figure 7 to the L side, in L side-lying figure 7 on the back down toward inguinal nodes, posterior inter-axillary anastomosis from lateral aspect of the R breast, in supine: re-worked all surfaces followed by deep abodminals.              PT Education - 12/24/19 1251    Education Details  Pt will only use light weights if she decides to increase exercising at home. Assessment in plan.    Person(s) Educated  Patient    Methods  Explanation    Comprehension  Verbalized understanding       PT Short Term Goals - 12/20/19 1253      PT SHORT TERM GOAL #1   Title  Pt will be measured for garments so that she can control her lymphedema at hom    Time  4    Period  Weeks    Status  New        PT Long Term Goals - 12/20/19 1254      PT LONG TERM GOAL #1   Title  Pt will report that pain and heaviness in her right arm at the end of the day are decreased by 50%    Time  8    Period  Weeks    Status  New      PT LONG TERM GOAL #2   Title  Pt will decrease Quick DASH to < 20 indicating a functional improvmemt of Right UE     Baseline  38.64    Time  8    Period  Weeks    Status  New      PT LONG TERM GOAL #4   Title  Pt will be independent in a home exercise program for strength    Time  8    Period  Weeks    Status  New            Plan - 12/24/19 1105  Clinical Impression Statement  Pt presents to physical therapy this session with visible edema in her R trunk down to her pelvis. Fitter came in during session to measure and fitter, patient and phyical therapist all discussed night time garments, day time garments for the arm and trunk. Pt will benefit best from compression garment for the trunk with sleeves and compression sleeve along with a night time garment for the trunk due to increased congestion in the abdomen. Pt abdomen cleared well with MLD for the anterior trunk and R side of the posterior trunk as demonstrated by pt had no waist prior to MLD on the R side compared to the L that improved following MLD. Pt was educated on the anatomy and physiology of the lymphatic system and the importance of clearing the trunk to clear the upper quadrant. Pt agreed to have [employer] come out and look at pump andpossilby get garment for the trunk due to currently she only has a garment for her RUE. Pt will benefit from continued POC at this time.    Personal Factors and Comorbidities  Comorbidity 3+    Comorbidities  14 nodes removed, previous radiation, new onset of cancer on left side, large copay    Examination-Activity Limitations  Reach Overhead    Examination-Participation Restrictions  Other    Stability/Clinical Decision Making  Evolving/Moderate complexity    Rehab Potential  Good    PT Frequency  2x / week    PT Duration  8 weeks    PT Treatment/Interventions  ADLs/Self Care Home Management;Therapeutic activities;Therapeutic exercise;Patient/family education;Manual lymph drainage;Passive range of motion;Scar mobilization;Compression bandaging;Taping;Vasopneumatic Device    PT Next Visit Plan  begin  scapular strength  training, continue MLD to the trunk and the RUE.    Recommended Other Services  compression T-shirt, new compression sleeve, vasopneumatic pump piece for the trunk    Consulted and Agree with Plan of Care  Patient       Patient will benefit from skilled therapeutic intervention in order to improve the following deficits and impairments:  Decreased knowledge of use of DME, Decreased knowledge of precautions, Pain, Increased edema, Impaired perceived functional ability, Increased fascial restricitons, Impaired UE functional use, Postural dysfunction, Decreased scar mobility, Decreased range of motion  Visit Diagnosis: Postmastectomy lymphedema  Chronic pain in right shoulder  Muscle weakness (generalized)     Problem List Patient Active Problem List   Diagnosis Date Noted  . Acute left-sided low back pain without sciatica 05/29/2017  . Menopausal syndrome (hot flushes) 01/23/2017  . Genetic testing 11/22/2016  . Neutropenia ([place]) 11/22/2016  . Angular cheilosis 11/22/2016  . Nausea with vomiting 11/13/2016  . Night sweats 11/13/2016  . Anxiety 11/01/2016  . Insomnia 11/01/2016  . Port catheter in place 11/01/2016  . Family history of breast cancer in female 10/24/2016  . Malignant neoplasm of lower-outer quadrant of right female breast ([place]) 09/13/2016  . Fracture, sacrum/coccyx [facility])     Ander Purpura, PT 12/24/2019, 12:57 PM  [address] [place], [place], [address] Phone: [phone]   Fax:  [phone]  Name: April Orozco MRN: [id number] Date of Birth: Apr 30, 1966

## 2019-12-28 ENCOUNTER — Encounter: Payer: Self-pay | Admitting: Hematology

## 2019-12-28 ENCOUNTER — Ambulatory Visit: Payer: BC Managed Care – PPO | Attending: Radiation Oncology | Admitting: Physical Therapy

## 2019-12-28 ENCOUNTER — Ambulatory Visit: Payer: BC Managed Care – PPO | Admitting: Radiation Oncology

## 2019-12-28 ENCOUNTER — Other Ambulatory Visit: Payer: Self-pay

## 2019-12-28 DIAGNOSIS — M6281 Muscle weakness (generalized): Secondary | ICD-10-CM | POA: Diagnosis not present

## 2019-12-28 DIAGNOSIS — M25511 Pain in right shoulder: Secondary | ICD-10-CM | POA: Diagnosis not present

## 2019-12-28 DIAGNOSIS — I972 Postmastectomy lymphedema syndrome: Secondary | ICD-10-CM | POA: Diagnosis not present

## 2019-12-28 DIAGNOSIS — G8929 Other chronic pain: Secondary | ICD-10-CM | POA: Insufficient documentation

## 2019-12-28 NOTE — Therapy (Addendum)
[place], [place], [address] Phone: [phone]   Fax:  [phone]  Physical Therapy Treatment  Patient Details  Name: April Orozco MRN: [id number] Date of Birth: Dec 21, 1965 Referring Provider (PT): Worthy Flank    Encounter Date: 12/28/2019  PT End of Session - 12/28/19 1401    Visit Number  3    Number of Visits  17    Date for PT Re-Evaluation  02/17/20    PT Start Time  1300    PT Stop Time  1345    PT Time Calculation (min)  45 min    Activity Tolerance  Patient tolerated treatment well    Behavior During Therapy  [facility] for tasks assessed/performed       Past Medical History:  Diagnosis Date  . Fracture, sacrum/coccyx ([place]) 1970   "Shattered tailbone"  . Heart murmur    [facility] has evaluated, felt benign  . Malignant neoplasm of lower-outer quadrant of right female breast ([place]) 09/13/2016  . Tachycardia    Intermittent, resolved with decreased caffeine intake    Past Surgical History:  Procedure Laterality Date  . APPENDECTOMY    . AXILLARY LYMPH NODE DISSECTION Right 04/23/2017   Procedure: RIGHT AXILLARY LYMPH NODE DISSECTION;  Surgeon: Rolm Bookbinder, MD;  Location: [place];  Service: General;  Laterality: Right;  . BREAST LUMPECTOMY WITH AXILLARY LYMPH NODE BIOPSY Right 04/23/2017  . BREAST RECONSTRUCTION WITH PLACEMENT OF TISSUE EXPANDER AND FLEX HD (ACELLULAR HYDRATED DERMIS) Bilateral 04/07/2017   Procedure: BILATERAL BREAST RECONSTRUCTION WITH PLACEMENT OF TISSUE EXPANDER AND FLEX HD (ACELLULAR HYDRATED DERMIS);  Surgeon: Crissie Reese, MD;  Location: [place];  Service: Plastics;  Laterality: Bilateral;  . IR GENERIC HISTORICAL  01/10/2017   IR CV LINE INJECTION 01/10/2017 Greggory Keen, MD [facility]INTER[facility]  . MASTECTOMY    . MASTECTOMY WITH RADIOACTIVE SEED GUIDED EXCISION AND AXILLARY SENTINEL LYMPH NODE BIOPSY Bilateral 04/07/2017   Procedure: RIGHT SKIN SPARING  MASTECTOMY WITH RADIOACTIVE SEED GUIDED AXILLARY LYMPH NODE EXCISION AND AXILLARY SENTINEL LYMPH NODE BIOPSY, LEFT PROPHYLACTIC SKIN SPARING MASTECTOMY;  Surgeon: Rolm Bookbinder, MD;  Location: [place];  Service: General;  Laterality: Bilateral;  . PORT-A-CATH REMOVAL N/A 04/07/2017   Procedure: REMOVAL PORT-A-CATH;  Surgeon: Rolm Bookbinder, MD;  Location: [place];  Service: General;  Laterality: N/A;  . PORTACATH PLACEMENT Right 09/23/2016   Procedure: INSERTION PORT-A-CATH WITH [place];  Surgeon: Rolm Bookbinder, MD;  Location: [place];  Service: General;  Laterality: Right;  . TONSILECTOMY/ADENOIDECTOMY WITH MYRINGOTOMY    . TUBAL LIGATION      There were no vitals filed for this visit.  Subjective Assessment - 12/28/19 1351    Subjective  Pt has decided to decrease her dietary sugar to try to decrease the inflammation in her body.    Pertinent History  right breast cancer in 2018 with chemo,double  mastectomy 5/18 2018 with 6 nodes removed on right, with 8 more nodes on right removed on 04/23/2017.  Pt with immediate placement of tissue expanders, but ultmately had the right tissue expander removed. She developed lymphedema in her right  arm. and trunk and received PT in [place] for it at that time. She has daytime sleeve and a compression pump for the arm only . Stil has expander on the left side, but had a recurrance of cancer on the left side in internal mammary lymph node. . She plans to have recontstruction. She will have another biopsy for the  left side tomrrow and will have radiation starting in February . 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  Yes    Pain Score  4     Pain Location  Shoulder    Pain Orientation  Right    Pain Descriptors /  Indicators  Aching    Pain Type  Chronic pain    Pain Radiating Towards  to back by scapular area    Pain Onset  More than a month ago    Pain Frequency  Constant    Aggravating Factors   using it at work    Pain Relieving Factors  ibuprofen                  Outpatient Rehab from 12/20/2019 in [place]  Lymphedema Life Impact Scale Total Score  61.76 %           [facility] Adult PT Treatment/Exercise - 12/28/19 0001      Exercises   Exercises  Shoulder      Shoulder Exercises: Supine   Horizontal ABduction  Strengthening;Right;Left;5 reps;Theraband    Theraband Level (Shoulder Horizontal ABduction)  Level 1 (Yellow)    External Rotation  Strengthening;Right;Left;5 reps;Theraband    Theraband Level (Shoulder External Rotation)  Level 1 (Yellow)    Flexion  Strengthening;Right;Left;5 reps;Theraband   narrow and wide grip   Theraband Level (Shoulder Flexion)  Level 1 (Yellow)    Diagonals  Strengthening;Right;Left;5 reps;Theraband    Theraband Level (Shoulder Diagonals)  Level 1 (Yellow)      Shoulder Exercises: Sidelying   ABduction  AROM    Other Sidelying Exercises  small circles with hand pointed to ceiling       Shoulder Exercises: Standing   Row  Strengthening;Right;Left;10 reps;Theraband    Theraband Level (Shoulder Row)  Level 2 (Red)      Manual Therapy   Manual Therapy  Manual Lymphatic Drainage (MLD);Taping    Manual Lymphatic Drainage (MLD)  short neck, superficial and deep abdominals, right inguinal nodes and right axillo-inguinal anastamosis, right chest, shoulder, upper arm, lower arm and hand and return along pathways, then to left sidelying for posterior interaxillary anastamosis and more work on side and back     Kinesiotex  Edema      Kinesiotix   Edema  2 pieces with 4 "fingers" one across back at posterior interaxillary anastamosis, another at right axillo-inguinal anastamosis Pt instructed to roll it off at  any sign of skin irritation or when it starts to come off, do not pull it off              PT Education - 12/28/19 1400    Education Details  how to remove kinesiotape    Person(s) Educated  Patient    Methods  Explanation    Comprehension  Verbalized understanding       PT Short Term Goals - 12/20/19 1253      PT SHORT TERM GOAL #1   Title  Pt will be measured for garments so that she can control her lymphedema at hom    Time  4    Period  Weeks    Status  New  PT Long Term Goals - 12/20/19 1254      PT LONG TERM GOAL #1   Title  Pt will report that pain and heaviness in her right arm at the end of the day are decreased by 50%    Time  8    Period  Weeks    Status  New      PT LONG TERM GOAL #2   Title  Pt will decrease Quick DASH to < 20 indicating a functional improvmemt of Right UE    Baseline  38.64    Time  8    Period  Weeks    Status  New      PT LONG TERM GOAL #4   Title  Pt will be independent in a home exercise program for strength    Time  8    Period  Weeks    Status  New            Plan - 12/28/19 1401    Clinical Impression Statement  Pt continue to struggel with fullness in her right trunk, back , shoulder and upper arm.  Added scapular exercises this session.  Pt will try to come one time a week and work on her exercises and pump at home.    Comorbidities  14 nodes removed, previous radiation, new onset of cancer on left side, large copay    Stability/Clinical Decision Making  Evolving/Moderate complexity    Rehab Potential  Good    PT Frequency  2x / week    PT Duration  8 weeks    PT Treatment/Interventions  ADLs/Self Care Home Management;Therapeutic activities;Therapeutic exercise;Patient/family education;Manual lymph drainage;Passive range of motion;Scar mobilization;Compression bandaging;Taping;Vasopneumatic Device    PT Next Visit Plan  check  scapular strength  training progess to [place], continue MLD to the trunk and  the RUE.    Consulted and Agree with Plan of Care  Patient       Patient will benefit from skilled therapeutic intervention in order to improve the following deficits and impairments:  Decreased knowledge of use of DME, Decreased knowledge of precautions, Pain, Increased edema, Impaired perceived functional ability, Increased fascial restricitons, Impaired UE functional use, Postural dysfunction, Decreased scar mobility, Decreased range of motion  Visit Diagnosis: Postmastectomy lymphedema  Chronic pain in right shoulder  Muscle weakness (generalized)     Problem List Patient Active Problem List   Diagnosis Date Noted  . Acute left-sided low back pain without sciatica 05/29/2017  . Menopausal syndrome (hot flushes) 01/23/2017  . Genetic testing 11/22/2016  . Neutropenia ([place]) 11/22/2016  . Angular cheilosis 11/22/2016  . Nausea with vomiting 11/13/2016  . Night sweats 11/13/2016  . Anxiety 11/01/2016  . Insomnia 11/01/2016  . Port catheter in place 11/01/2016  . Family history of breast cancer in female 10/24/2016  . Malignant neoplasm of lower-outer quadrant of right female breast ([place]) 09/13/2016  . Fracture, sacrum/coccyx ([place])    Donato Heinz. Owens Shark PT  [place] Levo 12/28/2019, 2:04 PM  [place], [place], [address] Phone: [phone]   Fax:  [phone]  Name: April Orozco MRN: [id number] Date of Birth: [birth year]  PHYSICAL THERAPY DISCHARGE SUMMARY  Visits from Start of Care: 3  Current functional level related to goals / functional outcomes: unknown   Remaining deficits: unknown   Education / Equipment: Lymphedema management  Plan: Patient agrees to discharge.  Patient goals were partially met. Patient is being discharged due  to not returning since the last visit.  ?????    Maudry Diego, PT 05/22/20 10:40 AM

## 2019-12-28 NOTE — Patient Instructions (Addendum)

## 2019-12-29 ENCOUNTER — Telehealth: Payer: Self-pay | Admitting: Hematology

## 2019-12-29 ENCOUNTER — Ambulatory Visit: Payer: BC Managed Care – PPO | Admitting: Radiation Oncology

## 2019-12-29 ENCOUNTER — Telehealth: Payer: Self-pay

## 2019-12-29 NOTE — Telephone Encounter (Signed)
TC to pt to let her know that per [facility] NP I called and scheduled her for PET scan on Monday 02/21/20 @10am . She is to arrive at 9:30am. No carb's 12 hours before. Water only 6 hours prior. Patient aware of appointment date, time, location, and instructions and verbalized understanding. Also sent over schedule message to get patient scheduled for LAB appointment on 02/21/20 and scheduled for follow up doctors appointment with Dr Burr Medico 1-2 days after PET scan.

## 2019-12-29 NOTE — Telephone Encounter (Signed)
I talk with patient regarding schedule  

## 2019-12-30 ENCOUNTER — Ambulatory Visit: Payer: BC Managed Care – PPO | Admitting: Radiation Oncology

## 2019-12-31 ENCOUNTER — Ambulatory Visit: Payer: BC Managed Care – PPO | Admitting: Radiation Oncology

## 2020-01-03 ENCOUNTER — Ambulatory Visit: Payer: BC Managed Care – PPO | Admitting: Radiation Oncology

## 2020-01-04 ENCOUNTER — Ambulatory Visit: Payer: BC Managed Care – PPO | Admitting: Physical Therapy

## 2020-01-05 ENCOUNTER — Ambulatory Visit: Payer: BC Managed Care – PPO | Admitting: Radiation Oncology

## 2020-01-07 ENCOUNTER — Ambulatory Visit: Payer: BC Managed Care – PPO | Admitting: Radiation Oncology

## 2020-01-10 ENCOUNTER — Ambulatory Visit: Payer: BC Managed Care – PPO | Admitting: Radiation Oncology

## 2020-01-12 ENCOUNTER — Ambulatory Visit: Payer: BC Managed Care – PPO | Admitting: Hematology

## 2020-01-12 ENCOUNTER — Other Ambulatory Visit: Payer: BC Managed Care – PPO

## 2020-02-13 ENCOUNTER — Other Ambulatory Visit: Payer: Self-pay | Admitting: Hematology

## 2020-02-14 NOTE — Progress Notes (Signed)
[place]   [phone] [phone]) [phone]   Clinic Follow up Note   Patient Care Team: Patient, No Pcp Per as PCP - General (General Practice) Rolm Bookbinder, MD as Consulting Physician (General Surgery) Truitt Merle, MD as Consulting Physician (Hematology) Kyung Rudd, MD as Consulting Physician (Radiation Oncology) Delice Bison Charlestine Massed, NP as Nurse Practitioner (Hematology and Oncology)  I connected with April Orozco on 02/24/2020 at  8:40 AM EDT by telephone visit and verified that I am speaking with the correct person using two identifiers.  I discussed the limitations, risks, security and privacy concerns of performing an evaluation and management service by telephone and the availability of in person appointments. I also discussed with the patient that there may be a patient responsible charge related to this service. The patient expressed understanding and agreed to proceed.    Patient's location:  Her car Provider's location:  My office   CHIEF COMPLAINT: Follow up right breast cancer  SUMMARY OF ONCOLOGIC HISTORY: Oncology History Overview Note  Cancer Staging Malignant neoplasm of lower-outer quadrant of right female breast [facility]) Staging form: Breast, AJCC 7th Edition - Clinical stage from 09/12/2016: Stage IIB (T2, N1, M0) - Signed by Truitt Merle, MD on 09/18/2016 - Pathologic stage from 04/07/2017: Stage IIB (T2, N1a, cM0) - Signed by Truitt Merle, MD on 05/10/2017     Malignant neoplasm of lower-outer quadrant of right female breast ([place])  09/10/2016 Mammogram   Mammogram and ultrasound showed a 2.7 cm mass at 8 clock position of the right breast, there is also a lobulated lymph nodes in the right axilla, slightly suspicious for malignancy.    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 Docetaxel 27m/m2 on 01/24/17, was to be given every 3 weeks but stopped due to worsening neuropathy (only 1 cycle given)   11/20/2016 Genetic Testing   Negative genetic testing on the Breast/Ovarian cancer panel.  Negative genetic testing for the MSH2 inversion analysis ([place] inversion). The Breast/Ovarian gene panel offered by [facility] includes sequencing and rearrangement analysis for the following 20 genes:  ATM, BARD1, BRCA1, BRCA2, BRIP1, CDH1, CHEK2, EPCAM, FANCC, MLH1, MSH2, MSH6, NBN, PALB2, PMS2, PTEN, RAD51C, RAD51D, TP53, and XRCC2.   The report date [facility] December [facility] and November 20, 2016 for the [facility].   02/14/2017 -  Anti-estrogen oral therapy   Tamoxifen 20 mg daily starting 02/14/17, held since surgery on 04/07/2017, restart 05/22/17   02/22/2017 Breast MRI   IMPRESSION: 1. Significantly smaller mass within the lower outer quadrant of the right breast, now measuring 1.5 cm (previously 2.7 cm). 2. Smaller right axillary lymph nodes.   04/07/2017 Surgery   RIGHT SKIN SPARING MASTECTOMY WITH RADIOACTIVE SEED GUIDED AXILLARY LYMPH NODE EXCISION AND AXILLARY SENTINEL LYMPH NODE BIOPSY, LEFT PROPHYLACTIC SKIN SPARING MASTECTOMY by Dr. WDonne Hazel   04/07/2017 Pathology Results   Diagnosis 04/07/17 1. Breast,  simple mastectomy, Left Prophylactic Skin Sparing - FIBROCYSTIC CHANGES WITH SCLEROSING ADENOSIS AND CALCIFICATIONS. - LOBULAR NEOPLASIA (ATYPICAL LOBULAR HYPERPLASIA). 2. Breast, simple mastectomy, Right Skin  Sparing - INVASIVE AND IN SITU DUCTAL CARCINOMA, 2.1 CM. - MARGINS NOT INVOLVED. - FIBROCYSTIC CHANGES WITH CALCIFICATIONS AND FOCAL ATYPICAL DUCTAL HYPERPLASIA. - METASTATIC CARCINOMA IN ONE LYMPH NODE WITH ASSOCIATED BIOPSY CAVITY (1/1). 3. Lymph nodes, regional resection, Right Axillary - METASTATIC CARCINOMA IN TWO OF SIX LYMPH NODES (2/6).   04/23/2017 Surgery   RIGHT AXILLARY LYMPH NODE DISSECTION by Dr. Donne Hazel    04/23/2017 Pathology Results   Diagnosis 04/23/17 Lymph nodes, regional resection, Right Axillary Contents - NO CARCINOMA IDENTIFIED IN EIGHT LYMPH NODES (0/8)     06/03/2017 Pathology Results   Skin right breast mastectomy and wound with results of: Skin with ulceration, acute suppurative inflammation and foci of suture material with giant cell reaction. No evidence of malignancy.    08/11/2017 - 10/07/2017 Radiation Therapy   Adjuvant breast and axilla radiation    02/05/2018 Imaging   Whole Body Bone Scan 02/05/18 IMPRESSION: 1. Focal increased radiotracer uptake in the right greater trochanter and left superior acetabular regions. Advise pelvic radiographic examination to further evaluate these areas for possible metastatic foci. It is possible that these areas show abnormal uptake due to asymmetric areas of arthropathy. 2. Probable arthropathy in the shoulders, elbows, and knees. Increased uptake of radiotracer in the mid face is likely due to paranasal sinus disease. 3. Increased radiotracer uptake throughout much of the left breast may be a nonspecific finding. This finding also may be seen due to breast carcinoma. Appropriate assessment of the left breast in this regard advised.   02/12/2018 Imaging   MRI Pelvis IMPRESSION: 1. 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 Finding is not suggestive of metastatic disease. 2. Focal tendinopathy of the distal right  gluteal tendons at the insertion on the right greater trochanter.   10/27/2019 Imaging   CT Chest with Contrast 10/27/19 IMPRESSION: 1. Interval development of asymmetric enlargement of left internal mammary lymph node. Cannot rule out metastatic adenopathy. Consider further investigation with PET-CT. 2. Emphysema and aortic atherosclerosis. Lad coronary artery calcifications noted.   Aortic Atherosclerosis (ICD10-I70.0) and Emphysema (ICD10-J43.9).   11/17/2019 PET scan   IMPRESSION: 1. 0.8 cm left internal mammary lymph node has a maximum SUV of 5.1, most compatible with malignancy. 2. Enlarging cystic lesion of the left anterior superior acetabulum has CT characteristics most typical for a geode or erosion related synovitis, but also has low-grade metabolic activity with maximum SUV 3.7. I favor arthropathy over malignancy although given the progression, surveillance is likely warranted. 3. Other imaging findings of potential clinical significance: Aortic Atherosclerosis (ICD10-I70.0). Emphysema (ICD10-J43.9).   12/21/2019 Pathology Results   FINAL MICROSCOPIC DIAGNOSIS:   A. LYMPH NODE, LEFT INTERNAL MAMMARY, NEEDLE CORE BIOPSY:  - Fragments of benign lymph node.  - No carcinoma identified.  - See comment.   COMMENT:   Pancytokeratin is negative. There are increased areas of plasma cells  suggestive of paracortical hyperplasia.       CURRENT THERAPY:  Tamoxifen 20 mg daily started 02/14/17, held since 12/24/2017, restart 02/06/2018, she stopped in the summer of 2020 due to poor tolerance. Restarted in 09/2019 after she tried exemestane for 1 week.   INTERVAL HISTORY:  April Orozco is here for a follow up of right breast cancer. She presents to the clinic alone.  She notes she is doing well. She has manageable joint pain and hot flashes.     REVIEW OF SYSTEMS:   Constitutional: Denies fevers, chills or abnormal weight loss (+) manageable hot flashes  Eyes: Denies  blurriness of vision Ears, nose, mouth, throat, and face: Denies mucositis or sore throat Respiratory: Denies cough, dyspnea or wheezes Cardiovascular: Denies palpitation, chest discomfort or lower extremity swelling Gastrointestinal:  Denies nausea, heartburn or change in bowel habits Skin: Denies abnormal skin rashes MSK: (+) manageable joint pain  Lymphatics: Denies new lymphadenopathy or easy bruising Neurological:Denies numbness, tingling or new weaknesses Behavioral/Psych: Mood is stable, no new changes  All other systems were reviewed with the patient and are negative.  MEDICAL HISTORY:  Past Medical History:  Diagnosis Date  . Fracture, sacrum/coccyx ([place]) 1970   "Shattered tailbone"  . Heart murmur    SE Cardiovascular has evaluated, felt benign  . Malignant neoplasm of lower-outer quadrant of right female breast ([place]) 09/13/2016  . Tachycardia    Intermittent, resolved with decreased caffeine intake    SURGICAL HISTORY: Past Surgical History:  Procedure Laterality Date  . APPENDECTOMY    . AXILLARY LYMPH NODE DISSECTION Right 04/23/2017   Procedure: RIGHT AXILLARY LYMPH NODE DISSECTION;  Surgeon: Rolm Bookbinder, MD;  Location: [facility];  Service: General;  Laterality: Right;  . BREAST LUMPECTOMY WITH AXILLARY LYMPH NODE BIOPSY Right 04/23/2017  . BREAST RECONSTRUCTION WITH PLACEMENT OF TISSUE EXPANDER AND FLEX HD (ACELLULAR HYDRATED DERMIS) Bilateral 04/07/2017   Procedure: BILATERAL BREAST RECONSTRUCTION WITH PLACEMENT OF TISSUE EXPANDER AND FLEX HD (ACELLULAR HYDRATED DERMIS);  Surgeon: Crissie Reese, MD;  Location: [facility];  Service: Plastics;  Laterality: Bilateral;  . IR GENERIC HISTORICAL  01/10/2017   IR CV LINE INJECTION 01/10/2017 Greggory Keen, MD [facility]-INTERV RAD  . MASTECTOMY    . MASTECTOMY WITH RADIOACTIVE SEED GUIDED EXCISION AND AXILLARY SENTINEL LYMPH NODE BIOPSY Bilateral 04/07/2017   Procedure: RIGHT SKIN SPARING MASTECTOMY WITH RADIOACTIVE SEED GUIDED  AXILLARY LYMPH NODE EXCISION AND AXILLARY SENTINEL LYMPH NODE BIOPSY, LEFT PROPHYLACTIC SKIN SPARING MASTECTOMY;  Surgeon: Rolm Bookbinder, MD;  Location: [facility];  Service: General;  Laterality: Bilateral;  . PORT-A-CATH REMOVAL N/A 04/07/2017   Procedure: REMOVAL PORT-A-CATH;  Surgeon: Rolm Bookbinder, MD;  Location: [facility];  Service: General;  Laterality: N/A;  . PORTACATH PLACEMENT Right 09/23/2016   Procedure: INSERTION PORT-A-CATH WITH [place];  Surgeon: Rolm Bookbinder, MD;  Location: [facility];  Service: General;  Laterality: Right;  . TONSILECTOMY/ADENOIDECTOMY WITH MYRINGOTOMY    . TUBAL LIGATION      I have reviewed the social history and family history with the patient and they are unchanged from previous note.  ALLERGIES:  is allergic to banana; sulfur; and tramadol.  MEDICATIONS:  Current Outpatient Medications  Medication Sig Dispense Refill  . exemestane (AROMASIN) 25 MG tablet Take 1 tablet (25 mg total) by mouth daily after breakfast. (Patient not taking: Reported on 12/14/2019) 30 tablet 2  . Folic Acid-Vit B5-DHR C16 (FOLBEE) 2.5-25-1 MG TABS tablet Take 1 tablet by mouth daily.    Marland Kitchen gabapentin (NEURONTIN) 300 MG capsule TAKE TWO CAPSULES BY MOUTH AT BEDTIME 180 capsule 1  . ibuprofen (ADVIL,MOTRIN) 200 MG tablet Take 800 mg by mouth every 6 (six) hours as needed for headache, mild pain or moderate pain.    . tamoxifen (NOLVADEX) 20 MG tablet Take 1 tablet (20 mg total) by mouth daily. 30 tablet 3  . zolpidem (AMBIEN) 5 MG tablet Take 1 tablet (  5 mg total) by mouth at bedtime as needed for sleep. 30 tablet 2   No current facility-administered medications for this visit.    PHYSICAL EXAMINATION: ECOG PERFORMANCE STATUS: 0 - Asymptomatic  No vitals taken today, Exam not performed today  LABORATORY DATA:  I have reviewed the data as listed CBC Latest Ref Rng & Units 02/22/2020 12/06/2019 10/11/2019  WBC 4.0 - 10.5 K/uL 5.4 9.1 5.8  Hemoglobin 12.0 -  15.0 g/dL 12.6 13.4 12.8  Hematocrit 36.0 - 46.0 % 39.4 41.1 38.9  Platelets 150 - 400 K/uL 246 278 241     CMP Latest Ref Rng & Units 02/22/2020 10/11/2019 12/04/2018  Glucose 70 - 99 mg/dL 94 109(H) 113(H)  BUN 6 - 20 mg/dL 8 10 9   Creatinine 0.44 - 1.00 mg/dL 0.71 0.76 0.76  Sodium 135 - 145 mmol/L 139 141 138  Potassium 3.5 - 5.1 mmol/L 4.1 3.5 4.1  Chloride 98 - 111 mmol/L 105 102 103  CO2 22 - 32 mmol/L 27 29 27   Calcium 8.9 - 10.3 mg/dL 9.2 9.3 9.6  Total Protein 6.5 - 8.1 g/dL 8.1 8.3(H) 8.2(H)  Total Bilirubin 0.3 - 1.2 mg/dL <0.2(L) 0.2(L) 0.3  Alkaline Phos 38 - 126 U/L 86 101 94  AST 15 - 41 U/L 16 20 19   ALT 0 - 44 U/L 12 19 16       RADIOGRAPHIC STUDIES: I have personally reviewed the radiological images as listed and agreed with the findings in the report. No results found.   ASSESSMENT & PLAN:  CHESSIE NEUHARTH is a 54 y.o. female with    1. 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 She is s/p neoadjuvant chemo with AC-T, B/l mastectomy and right axillary LN dissection and adjuvant radiation.  -Shehas beenon antiestrogen therapy with Tamoxifensince 01/2017,but not very compliant due to poor tolerance. She has stopped on her own in 06/2019 due to severe arthralgia and hot flashes. She tried Exemestane in 09/2019 for 1 week but switched back to Tamoxifen 1 week later.  -Her PET from 11/17/19 shows 0.8 cm left internal mammary lymph node has a maximum SUV of 5.1, most compatible with malignancy, no also hypermetabolic lesion. This is likely distant metastasis then local recurrence since her initial breast cancer was on right. No evidence of other distant metastasis.  -Her 12/21/19 left internal mammary LN biopsy was found to be benign. .  -I personally reviewed and discussed her CT chest from 02/22/20 which showed stable left internal mammary nodes,  no other suspicious lesion. Will continue to monitor with repeat scan in 3 months. She is agreeable. -Continue Tamoxifen  -F/u in 3 months    2. Genetics, Testing was negative   3. Anxiety and insomnia -Overall improved since she completed major breast cancer treatment -We discussed dependence from Ambien, she is willing to weaned off. She is currently trying to wean off 76m Ambien  4. Neuropathy, G1 -Chemotherapy induced.  -Numbness and tingling of her fingers and toes, overall improved -Controlled with Gabapentin. I recommended she increase to 602mat night which will also help her sleep.   5. Hot flash and night sweats -Effexor was not effective. She will increase her Gabapentin to [id number].  -Stable and manageable   6. 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 Finding is not suggestive of metastatic disease. Focal tendinopathy of the distal right gluteal tendons at the insertion on the right greater trochanter -Bilateral hip pain improved on new diet with no sugar. She is not taking prescription pain medication,only take ibuprofen occasionally. -Stable and manageable     Plan  CT chest reviewed, stable -Continue Tamoxifen   -F/u in 3 months with lab and CT Chest a few days before.    No problem-specific Assessment & Plan notes found for this encounter.   Orders Placed This Encounter  Procedures  . CT Chest W Contrast    Standing Status:   Future    Standing Expiration Date:   02/23/2021    Order Specific Question:   If indicated for the ordered procedure, I authorize the administration of contrast media per Radiology protocol    Answer:   Yes    Order Specific Question:   Is patient pregnant?    Answer:   No    Order Specific Question:   Preferred imaging location?    Answer:   [facility]     Order Specific Question:   Radiology Contrast Protocol - do NOT remove file path    Answer:   [email or web address]   I discussed the assessment and treatment plan with the patient. The patient was provided an opportunity to ask questions and all were answered. The patient agreed with the plan and demonstrated an understanding of the instructions.  The patient was advised to call back or seek an in-person evaluation if the symptoms worsen or if the condition fails to improve as anticipated.  The total time spent in the appointment was 25 minutes.     Truitt Merle, MD 02/24/2020   I, Joslyn Devon, am acting as scribe for Truitt Merle, MD.   I have reviewed the above documentation for accuracy and completeness, and I agree with the above.

## 2020-02-16 ENCOUNTER — Other Ambulatory Visit: Payer: Self-pay | Admitting: Hematology

## 2020-02-16 DIAGNOSIS — C50511 Malignant neoplasm of lower-outer quadrant of right female breast: Secondary | ICD-10-CM

## 2020-02-16 DIAGNOSIS — Z170 Estrogen receptor positive status [ER+]: Secondary | ICD-10-CM

## 2020-02-17 ENCOUNTER — Telehealth: Payer: Self-pay

## 2020-02-17 NOTE — Telephone Encounter (Signed)
I was unable to leave vm.  I sent a mychart message.

## 2020-02-18 ENCOUNTER — Telehealth: Payer: Self-pay | Admitting: Pain Medicine

## 2020-02-18 NOTE — Telephone Encounter (Signed)
Changed 4/1 office visit to phone visit per 3/25 sch msg. Pt is aware of appt change.

## 2020-02-21 ENCOUNTER — Ambulatory Visit (HOSPITAL_COMMUNITY): Payer: BC Managed Care – PPO

## 2020-02-21 ENCOUNTER — Inpatient Hospital Stay: Payer: BC Managed Care – PPO

## 2020-02-22 ENCOUNTER — Other Ambulatory Visit: Payer: Self-pay

## 2020-02-22 ENCOUNTER — Encounter (HOSPITAL_COMMUNITY): Payer: Self-pay

## 2020-02-22 ENCOUNTER — Ambulatory Visit (HOSPITAL_COMMUNITY)
Admission: RE | Admit: 2020-02-22 | Discharge: 2020-02-22 | Disposition: A | Discharge status: Home / Self Care | Payer: BC Managed Care – PPO | Source: Ambulatory Visit | Attending: Hematology | Admitting: Hematology

## 2020-02-22 ENCOUNTER — Inpatient Hospital Stay: Payer: BC Managed Care – PPO | Attending: Hematology

## 2020-02-22 DIAGNOSIS — Z170 Estrogen receptor positive status [ER+]: Secondary | ICD-10-CM | POA: Insufficient documentation

## 2020-02-22 DIAGNOSIS — C50911 Malignant neoplasm of unspecified site of right female breast: Secondary | ICD-10-CM | POA: Diagnosis not present

## 2020-02-22 DIAGNOSIS — C50511 Malignant neoplasm of lower-outer quadrant of right female breast: Secondary | ICD-10-CM | POA: Insufficient documentation

## 2020-02-22 LAB — CBC WITH DIFFERENTIAL/PLATELET
Abs Immature Granulocytes: 0 10*3/uL (ref 0.00–0.07)
Basophils Absolute: 0 10*3/uL (ref 0.0–0.1)
Basophils Relative: 0 %
Eosinophils Absolute: 0.1 10*3/uL (ref 0.0–0.5)
Eosinophils Relative: 2 %
HCT: 39.4 % (ref 36.0–46.0)
Hemoglobin: 12.6 g/dL (ref 12.0–15.0)
Immature Granulocytes: 0 %
Lymphocytes Relative: 27 %
Lymphs Abs: 1.5 10*3/uL (ref 0.7–4.0)
MCH: 28.8 pg (ref 26.0–34.0)
MCHC: 32 g/dL (ref 30.0–36.0)
MCV: 90 fL (ref 80.0–100.0)
Monocytes Absolute: 0.3 10*3/uL (ref 0.1–1.0)
Monocytes Relative: 6 %
Neutro Abs: 3.5 10*3/uL (ref 1.7–7.7)
Neutrophils Relative %: 65 %
Platelets: 246 10*3/uL (ref 150–400)
RBC: 4.38 MIL/uL (ref 3.87–5.11)
RDW: 14 % (ref 11.5–15.5)
WBC: 5.4 10*3/uL (ref 4.0–10.5)
nRBC: 0 % (ref 0.0–0.2)

## 2020-02-22 LAB — COMPREHENSIVE METABOLIC PANEL
ALT: 12 U/L (ref 0–44)
AST: 16 U/L (ref 15–41)
Albumin: 3.8 g/dL (ref 3.5–5.0)
Alkaline Phosphatase: 86 U/L (ref 38–126)
Anion gap: 7 (ref 5–15)
BUN: 8 mg/dL (ref 6–20)
CO2: 27 mmol/L (ref 22–32)
Calcium: 9.2 mg/dL (ref 8.9–10.3)
Chloride: 105 mmol/L (ref 98–111)
Creatinine, Ser: 0.71 mg/dL (ref 0.44–1.00)
GFR calc Af Amer: >60 mL/min (ref >60)
GFR calc non Af Amer: >60 mL/min (ref >60)
Glucose, Bld: 94 mg/dL (ref 70–99)
Potassium: 4.1 mmol/L (ref 3.5–5.1)
Sodium: 139 mmol/L (ref 135–145)
Total Bilirubin: <0.2 mg/dL — ABNORMAL LOW (ref 0.3–1.2)
Total Protein: 8.1 g/dL (ref 6.5–8.1)

## 2020-02-22 MED ORDER — SODIUM CHLORIDE (PF) 0.9 % IJ SOLN
INTRAMUSCULAR | Status: AC
  Filled 2020-02-22: qty 50

## 2020-02-22 MED ORDER — IOHEXOL 300 MG/ML  SOLN
75.0000 mL | Freq: Once | INTRAMUSCULAR | Status: AC | PRN
  Administered 2020-02-22: 75 mL via INTRAVENOUS

## 2020-02-23 ENCOUNTER — Ambulatory Visit: Payer: BC Managed Care – PPO | Admitting: Hematology

## 2020-02-24 ENCOUNTER — Inpatient Hospital Stay: Payer: BC Managed Care – PPO | Attending: Hematology | Admitting: Hematology

## 2020-02-24 ENCOUNTER — Encounter: Payer: Self-pay | Admitting: Hematology

## 2020-02-24 DIAGNOSIS — Z170 Estrogen receptor positive status [ER+]: Secondary | ICD-10-CM | POA: Diagnosis not present

## 2020-02-24 DIAGNOSIS — C50511 Malignant neoplasm of lower-outer quadrant of right female breast: Secondary | ICD-10-CM

## 2020-03-03 ENCOUNTER — Telehealth: Payer: Self-pay | Admitting: Hematology

## 2020-03-03 NOTE — Telephone Encounter (Signed)
Scheduled per los, called patient and voicemail box is full.

## 2020-03-06 DIAGNOSIS — M65131 Other infective (teno)synovitis, right wrist: Secondary | ICD-10-CM | POA: Diagnosis not present

## 2020-03-30 ENCOUNTER — Other Ambulatory Visit: Payer: Self-pay

## 2020-03-30 ENCOUNTER — Ambulatory Visit (INDEPENDENT_AMBULATORY_CARE_PROVIDER_SITE_OTHER): Payer: BC Managed Care – PPO

## 2020-03-30 ENCOUNTER — Ambulatory Visit
Admission: EM | Admit: 2020-03-30 | Discharge: 2020-03-30 | Disposition: A | Discharge status: Home / Self Care | Payer: BC Managed Care – PPO | Attending: Emergency Medicine | Admitting: Emergency Medicine

## 2020-03-30 DIAGNOSIS — M25531 Pain in right wrist: Secondary | ICD-10-CM

## 2020-03-30 DIAGNOSIS — M25532 Pain in left wrist: Secondary | ICD-10-CM

## 2020-03-30 DIAGNOSIS — M7989 Other specified soft tissue disorders: Secondary | ICD-10-CM | POA: Diagnosis not present

## 2020-03-30 DIAGNOSIS — M778 Other enthesopathies, not elsewhere classified: Secondary | ICD-10-CM

## 2020-03-30 MED ORDER — ACETAMINOPHEN-CODEINE #3 300-30 MG PO TABS: 1.0000 | ORAL_TABLET | Freq: Three times a day (TID) | ORAL | 0 refills | Status: AC | PRN

## 2020-03-30 MED ORDER — MELOXICAM 7.5 MG PO TABS: 7.5000 mg | ORAL_TABLET | Freq: Every day | ORAL | 0 refills | Status: DC

## 2020-03-30 NOTE — Discharge Instructions (Signed)
X-rays negative for fracture or dislocation Continue conservative management of rest, ice, and elevation Mobic prescribed.  Take as directed Tylenol #3 prescribed.  Use as needed for severe break-through pain.  This medication will not be refilled Follow up with orthopedist as soon as possible for further evaluation and management Return or go to the ER if you have any new or worsening symptoms (fever, chills, chest pain, increased redness, swelling, joint pain, worsening symptoms despite treatment, etc...)

## 2020-03-30 NOTE — ED Provider Notes (Signed)
April Orozco   [id number] 03/30/20 Arrival Time: [id number]  CC: RT wrist pain   SUBJECTIVE: History from: patient. April Orozco is a 54 y.o. female complains of right wrist pain x 2 weeks.  Symptoms began after painting for 8 hours.  Localizes the pain to the medial aspect.  Describes the pain as intermittent and achy in character.  Has tried OTC medications and brace without relief.  Symptoms are made worse with grip.  Denies similar symptoms in the past.  Complains of swelling, and warmth to the touch.  Denies fever, chills, erythema, ecchymosis, weakness, numbness and tingling.    Hx significant for lymphedema secondary to breast cancer   ROS: As per HPI.  All other pertinent ROS negative.     Past Medical History:  Diagnosis Date  . Fracture, sacrum/coccyx ([place]) 1970   "Shattered tailbone"  . Heart murmur    [facility] has evaluated, felt benign  . Malignant neoplasm of lower-outer quadrant of right female breast ([place]) 09/13/2016  . Tachycardia    Intermittent, resolved with decreased caffeine intake   Past Surgical History:  Procedure Laterality Date  . APPENDECTOMY    . AXILLARY LYMPH NODE DISSECTION Right 04/23/2017   Procedure: RIGHT AXILLARY LYMPH NODE DISSECTION;  Surgeon: Rolm Bookbinder, MD;  Location: [facility];  Service: General;  Laterality: Right;  . BREAST LUMPECTOMY WITH AXILLARY LYMPH NODE BIOPSY Right 04/23/2017  . BREAST RECONSTRUCTION WITH PLACEMENT OF TISSUE EXPANDER AND FLEX HD (ACELLULAR HYDRATED DERMIS) Bilateral 04/07/2017   Procedure: BILATERAL BREAST RECONSTRUCTION WITH PLACEMENT OF TISSUE EXPANDER AND FLEX HD (ACELLULAR HYDRATED DERMIS);  Surgeon: Crissie Reese, MD;  Location: [facility];  Service: Plastics;  Laterality: Bilateral;  . IR GENERIC HISTORICAL  01/10/2017   IR CV LINE INJECTION 01/10/2017 Greggory Keen, MD [facility]  . MASTECTOMY    . MASTECTOMY WITH RADIOACTIVE SEED GUIDED EXCISION AND AXILLARY SENTINEL LYMPH NODE BIOPSY Bilateral  04/07/2017   Procedure: RIGHT SKIN SPARING MASTECTOMY WITH RADIOACTIVE SEED GUIDED AXILLARY LYMPH NODE EXCISION AND AXILLARY SENTINEL LYMPH NODE BIOPSY, LEFT PROPHYLACTIC SKIN SPARING MASTECTOMY;  Surgeon: Rolm Bookbinder, MD;  Location: [facility];  Service: General;  Laterality: Bilateral;  . PORT-A-CATH REMOVAL N/A 04/07/2017   Procedure: REMOVAL PORT-A-CATH;  Surgeon: Rolm Bookbinder, MD;  Location: [facility];  Service: General;  Laterality: N/A;  . PORTACATH PLACEMENT Right 09/23/2016   Procedure: INSERTION PORT-A-CATH WITH [place];  Surgeon: Rolm Bookbinder, MD;  Location: [facility];  Service: General;  Laterality: Right;  . TONSILECTOMY/ADENOIDECTOMY WITH MYRINGOTOMY    . TUBAL LIGATION     Allergies  Allergen Reactions  . Banana Anaphylaxis  . Prednisone   . Sulfur Hives  . Tramadol Rash   No current facility-administered medications on file prior to encounter.   Current Outpatient Medications on File Prior to Encounter  Medication Sig Dispense Refill  . exemestane (AROMASIN) 25 MG tablet Take 1 tablet (25 mg total) by mouth daily after breakfast. (Patient not taking: Reported on 12/14/2019) 30 tablet 2  . Folic Acid-Vit Q000111Q [id number] (FOLBEE) 2.5-25-1 MG TABS tablet Take 1 tablet by mouth daily.    Marland Kitchen gabapentin (NEURONTIN) 300 MG capsule TAKE TWO CAPSULES BY MOUTH AT BEDTIME 180 capsule 1  . ibuprofen (ADVIL,MOTRIN) 200 MG tablet Take 800 mg by mouth every 6 (six) hours as needed for headache, mild pain or moderate pain.    . tamoxifen (NOLVADEX) 20 MG tablet Take 1 tablet (20 mg total) by mouth daily. 30 tablet 3  .  zolpidem (AMBIEN) 5 MG tablet Take 1 tablet (5 mg total) by mouth at bedtime as needed for sleep. 30 tablet 2   Social History   Socioeconomic History  . Marital status: Widowed    Spouse name: Not on file  . Number of children: 5  . Years of education: Not on file  . Highest education level: Not on file  Occupational History  . Occupation: Facilities manager: [employer]    Comment: OR trauma nurse  Tobacco Use  . Smoking status: Never Smoker  . Smokeless tobacco: Never Used  Substance and Sexual Activity  . Alcohol use: Yes    Comment: social drinker   . Drug use: No  . Sexual activity: Not on file  Other Topics Concern  . Not on file  Social History Narrative  . Not on file   Social Determinants of Health   Financial Resource Strain:   . Difficulty of Paying Living Expenses:   Food Insecurity:   . Worried About Charity fundraiser in the Last Year:   . Arboriculturist in the Last Year:   Transportation Needs:   . Film/video editor (Medical):   Marland Kitchen Lack of Transportation (Non-Medical):   Physical Activity:   . Days of Exercise per Week:   . Minutes of Exercise per Session:   Stress:   . Feeling of Stress :   Social Connections:   . Frequency of Communication with Friends and Family:   . Frequency of Social Gatherings with Friends and Family:   . Attends Religious Services:   . Active Member of Clubs or Organizations:   . Attends Archivist Meetings:   Marland Kitchen Marital Status:   Intimate Partner Violence:   . Fear of Current or Ex-Partner:   . Emotionally Abused:   Marland Kitchen Physically Abused:   . Sexually Abused:    Family History  Problem Relation Age of Onset  . Diabetes Mother   . Diabetes Father   . Skin cancer Father 88       NOS type; worked as a Theme park manager for many years  . Hypertension Other   . Breast cancer Cousin 52       paternal 1st cousin; w/ mets to bone  . Breast cancer Cousin        paternal 1st cousin dx 1-50; s/p lump  . Breast cancer Cousin        paternal 1st cousin dx 11-53 w/ mets to LN; s/p BL mastectomies  . Heart disease Maternal Aunt 58  . Colon cancer Paternal Uncle        dx 67-68  . Goiter Maternal Grandmother        d. bled to death following goiter surgery  . Heart attack Maternal Grandfather        d. early 51s  . Lung cancer Paternal Grandmother        dx 48s; d. 88y    . Diabetes Paternal Grandmother   . Heart failure Paternal Grandfather        d. 75-76  . Alzheimer's disease Paternal Grandfather   . Other Daughter 72       daughter w/ hx benign breast lump/cyst removed  . Epilepsy Maternal Aunt   . Alzheimer's disease Paternal Aunt   . Brain cancer Cousin 4       paternal 1st cousin; d. 36y; NOS type    OBJECTIVE:  Vitals:   03/30/20 1156  BP: Marland Kitchen)  143/74  Pulse: 95  Resp: 20  Temp: (!) 97.4 F (36.3 C)  SpO2: 98%    General appearance: ALERT; in no acute distress.  Head: NCAT Lungs: Normal respiratory effort CV: Radial pulse 2+. Cap refill < 2 seconds Musculoskeletal: LT wrist Inspection: Skin warm, dry, clear and intact without obvious erythema, effusion, or ecchymosis.  Palpation: TTP over medial aspect of wrist ROM: LROM  Strength: 4+/5 grip strength Skin: warm and dry Neurologic: Ambulates without difficulty; Sensation intact about the upper extremities Psychological: alert and cooperative; normal mood and affect  DIAGNOSTIC STUDIES:  DG Wrist Complete Right  Result Date: 03/30/2020 CLINICAL DATA:  Pain and swelling after painting EXAM: RIGHT WRIST - COMPLETE 3+ VIEW COMPARISON:  None. FINDINGS: Frontal, oblique, lateral, and ulnar deviation scaphoid images were obtained. No fracture or dislocation. Joint spaces appear normal. No erosive change. IMPRESSION: No fracture or dislocation.  No evident arthropathy. Electronically Signed   By: Lowella Grip III M.D.   On: 03/30/2020 12:08     X-rays negative for bony abnormalities including fracture, or dislocation.  No soft tissue swelling.    I have reviewed the x-rays myself and the radiologist interpretation. I am in agreement with the radiologist interpretation.     ASSESSMENT & PLAN:  1. Left wrist pain   2. Left wrist tendinitis      Meds ordered this encounter  Medications  . meloxicam (MOBIC) 7.5 MG tablet    Sig: Take 1 tablet (7.5 mg total) by mouth daily.     Dispense:  30 tablet    Refill:  0    Order Specific Question:   Supervising Provider    Answer:   Raylene Everts [id number]  . acetaminophen-codeine (TYLENOL #3) 300-30 MG tablet    Sig: Take 1 tablet by mouth every 8 (eight) hours as needed for up to 3 days for moderate pain.    Dispense:  10 tablet    Refill:  0    Order Specific Question:   Supervising Provider    Answer:   Raylene Everts [id number]   X-rays negative for fracture or dislocation Continue conservative management of rest, ice, and elevation Mobic prescribed.  Take as directed Tylenol #3 prescribed.  Use as needed for severe break-through pain.  This medication will not be refilled Follow up with orthopedist as soon as possible for further evaluation and management Return or go to the ER if you have any new or worsening symptoms (fever, chills, chest pain, increased redness, swelling, joint pain, worsening symptoms despite treatment, etc...)   [place] Controlled Substances Registry consulted for this patient. I feel the risk/benefit ratio today is favorable for proceeding with this prescription for a controlled substance. Medication sedation precautions given.  Reviewed expectations re: course of current medical issues. Questions answered. Outlined signs and symptoms indicating need for more acute intervention. Patient verbalized understanding. After Visit Summary given.    Lestine Box, PA-C 03/30/20 1256

## 2020-03-30 NOTE — ED Triage Notes (Signed)
Pt presents with right wrist pain after painting. Pt was seen for same 2 weeks ago but pain has been worsening. Has h/o lymphedema in right arm after breast CA

## 2020-05-23 ENCOUNTER — Encounter: Payer: Self-pay | Admitting: Hematology

## 2020-05-24 NOTE — Progress Notes (Signed)
[place]   [phone] Fax:([phone]) [phone]   Clinic Follow up Note   Patient Care Team: Patient, No Pcp Per as PCP - General (General Practice) Rolm Bookbinder, MD as Consulting Physician (General Surgery) Truitt Merle, MD as Consulting Physician (Hematology) Kyung Rudd, MD as Consulting Physician (Radiation Oncology) Delice Bison, Charlestine Massed, NP as Nurse Practitioner (Hematology and Oncology)  Date of Service:  05/25/2020  CHIEF COMPLAINT: Follow up right breast cancer  SUMMARY OF ONCOLOGIC HISTORY: Oncology History Overview Note  Cancer Staging Malignant neoplasm of lower-outer quadrant of right female breast [facility]) Staging form: Breast, AJCC 7th Edition - Clinical stage from 09/12/2016: Stage IIB (T2, N1, M0) - Signed by Truitt Merle, MD on 09/18/2016 - Pathologic stage from 04/07/2017: Stage IIB (T2, N1a, cM0) - Signed by Truitt Merle, MD on 05/10/2017     Malignant neoplasm of lower-outer quadrant of right female breast ([place])  09/10/2016 Mammogram   Mammogram and ultrasound showed a 2.7 cm mass at 8 clock position of the right breast, there is also a lobulated lymph nodes in the right axilla, slightly suspicious for malignancy.    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 Docetaxel 38m/m2 on 01/24/17, was to be given every 3 weeks but stopped due to worsening  neuropathy (only 1 cycle given)   11/20/2016 Genetic Testing   Negative genetic testing on the Breast/Ovarian cancer panel.  Negative genetic testing for the MSH2 inversion analysis ([place] inversion). The Breast/Ovarian gene panel offered by [facility] includes sequencing and rearrangement analysis for the following 20 genes:  ATM, BARD1, BRCA1, BRCA2, BRIP1, CDH1, CHEK2, EPCAM, FANCC, MLH1, MSH2, MSH6, NBN, PALB2, PMS2, PTEN, RAD51C, RAD51D, TP53, and XRCC2.   The report date is December 19 [facility] and November 20, 2016 for the BE[facility].   02/14/2017 -  Anti-estrogen oral therapy   Tamoxifen 20 mg daily starting 02/14/17, held since surgery on 04/07/2017, restart 05/22/17   02/22/2017 Breast MRI   IMPRESSION: 1. Significantly smaller mass within the lower outer quadrant of the right breast, now measuring 1.5 cm (previously 2.7 cm). 2. Smaller right axillary lymph nodes.   04/07/2017 Surgery   RIGHT SKIN SPARING MASTECTOMY WITH RADIOACTIVE SEED GUIDED AXILLARY LYMPH NODE EXCISION AND AXILLARY SENTINEL LYMPH NODE BIOPSY, LEFT PROPHYLACTIC SKIN SPARING MASTECTOMY by Dr. WDonne Hazel   04/07/2017 Pathology Results   Diagnosis 04/07/17 1. Breast, simple mastectomy, Left Prophylactic Skin Sparing - FIBROCYSTIC CHANGES WITH SCLEROSING ADENOSIS AND CALCIFICATIONS. - LOBULAR NEOPLASIA (ATYPICAL LOBULAR HYPERPLASIA). 2. Breast, simple mastectomy, Right Skin Sparing - INVASIVE AND IN SITU DUCTAL CARCINOMA, 2.1 CM. - MARGINS NOT INVOLVED. - FIBROCYSTIC CHANGES WITH CALCIFICATIONS AND FOCAL ATYPICAL DUCTAL HYPERPLASIA. - METASTATIC CARCINOMA IN ONE LYMPH NODE WITH ASSOCIATED BIOPSY CAVITY (1/1). 3. Lymph nodes, regional resection, Right Axillary - METASTATIC CARCINOMA IN TWO OF SIX LYMPH NODES (2/6).   04/23/2017 Surgery   RIGHT AXILLARY LYMPH NODE DISSECTION  by Dr. Donne Hazel    04/23/2017 Pathology Results   Diagnosis 04/23/17 Lymph nodes, regional resection, Right Axillary Contents - NO  CARCINOMA IDENTIFIED IN EIGHT LYMPH NODES (0/8)     06/03/2017 Pathology Results   Skin right breast mastectomy and wound with results of: Skin with ulceration, acute suppurative inflammation and foci of suture material with giant cell reaction. No evidence of malignancy.    08/11/2017 - 10/07/2017 Radiation Therapy   Adjuvant breast and axilla radiation    02/05/2018 Imaging   Whole Body Bone Scan 02/05/18 IMPRESSION: 1. Focal increased radiotracer uptake in the right greater trochanter and left superior acetabular regions. Advise pelvic radiographic examination to further evaluate these areas for possible metastatic foci. It is possible that these areas show abnormal uptake due to asymmetric areas of arthropathy. 2. Probable arthropathy in the shoulders, elbows, and knees. Increased uptake of radiotracer in the mid face is likely due to paranasal sinus disease. 3. Increased radiotracer uptake throughout much of the left breast may be a nonspecific finding. This finding also may be seen due to breast carcinoma. Appropriate assessment of the left breast in this regard advised.   02/12/2018 Imaging   MRI Pelvis IMPRESSION: 1. 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 Finding is not suggestive of metastatic disease. 2. Focal tendinopathy of the distal right gluteal tendons at the insertion on the right greater trochanter.   10/27/2019 Imaging   CT Chest with Contrast 10/27/19 IMPRESSION: 1. Interval development of asymmetric enlargement of left internal mammary lymph node. Cannot rule out metastatic adenopathy. Consider further investigation with PET-CT. 2. Emphysema and aortic atherosclerosis. Lad coronary artery calcifications noted.   Aortic Atherosclerosis (ICD10-I70.0) and Emphysema (ICD10-J43.9).   11/17/2019 PET scan   IMPRESSION: 1. 0.8 cm left internal mammary lymph node has a  maximum SUV of 5.1, most compatible with malignancy. 2. Enlarging cystic lesion of the left anterior superior acetabulum has CT characteristics most typical for a geode or erosion related synovitis, but also has low-grade metabolic activity with maximum SUV 3.7. I favor arthropathy over malignancy although given the progression, surveillance is likely warranted. 3. Other imaging findings of potential clinical significance: Aortic Atherosclerosis (ICD10-I70.0). Emphysema (ICD10-J43.9).   12/21/2019 Pathology Results   FINAL MICROSCOPIC DIAGNOSIS:   A. LYMPH NODE, LEFT INTERNAL MAMMARY, NEEDLE CORE BIOPSY:  - Fragments of benign lymph node.  - No carcinoma identified.  - See comment.   COMMENT:   Pancytokeratin is negative. There are increased areas of plasma cells  suggestive of paracortical hyperplasia.    02/22/2020 Imaging   CT chest  IMPRESSION: 1. Similar size of enlarged left internal mammary nodes. Especially based on hypermetabolism on PET, these remain suspicious for isolated metastatic disease. 2.  No acute process in the chest. 3. Age advanced coronary artery atherosclerosis. Recommend assessment of coronary risk factors and consideration of medical therapy. 4.  Aortic Atherosclerosis (ICD10-I70.0). 5. Paraseptal emphysema.   05/25/2020 Imaging   CT Chest  IMPRESSION: 1. Unchanged prominent left internal mammary and anterior mediastinal lymph nodes. Despite stability, these remain generally suspicious for metastatic disease given distribution and previous FDG PET avidity. No new lymphadenopathy or new findings suspicious for metastatic disease in the chest. 2. Unchanged subpleural radiation fibrosis of the anterior right upper lobe. 3. Emphysema (ICD10-J43.9). 4. Coronary artery disease. Aortic Atherosclerosis (ICD10-I70.0). 5. Hepatic steatosis.      CURRENT THERAPY:  Tamoxifen 20 mg daily started 02/14/17, held  since 12/24/2017, restart 02/06/2018, she  stopped in the summer of 2020 due to poor tolerance. Restarted Tamoxifen in 09/2019 after she tried exemestane for 1 week.  INTERVAL HISTORY:  April Orozco is here for a follow up of right breast cancer. She presents to the clinic alone. She feels stable and mildly fatigued. She denies pain or SOB. She notes her weight has been fluctuating based on her energy and activity level. She notes she is no longer on Gabapentin. She is on Tamoxifen and Folic Acid. She uses ibuprofen as needed. She does have joint pain from Tamoxifen. After painting for 6 hours straight she had right wrist swelling from Tendonitis. This has resolved. She was [facility] by a [facility] for consult about breast reconstruction. She plans to proceed soon. She only has left tissue expander remaining after the other was removed due to risk of infection.  She notes she did not smoke but grew up around heavy smokers for years.    REVIEW OF SYSTEMS:   Constitutional: Denies fevers, chills or abnormal weight loss Eyes: Denies blurriness of vision Ears, nose, mouth, throat, and face: Denies mucositis or sore throat Respiratory: Denies cough, dyspnea or wheezes Cardiovascular: Denies palpitation, chest discomfort or lower extremity swelling Gastrointestinal:  Denies nausea, heartburn or change in bowel habits Skin: Denies abnormal skin rashes MSK: (+) Joint pain  Lymphatics: Denies new lymphadenopathy or easy bruising Neurological:Denies numbness, tingling or new weaknesses Behavioral/Psych: Mood is stable, no new changes  All other systems were reviewed with the patient and are negative.  MEDICAL HISTORY:  Past Medical History:  Diagnosis Date  . Fracture, sacrum/coccyx ([place]) 1970   "Shattered tailbone"  . Heart murmur    [facility] Cardiovascular has evaluated, felt benign  . Malignant neoplasm of lower-outer quadrant of right female breast ([place]) 09/13/2016  . Tachycardia    Intermittent, resolved with decreased caffeine  intake    SURGICAL HISTORY: Past Surgical History:  Procedure Laterality Date  . APPENDECTOMY    . AXILLARY LYMPH NODE DISSECTION Right 04/23/2017   Procedure: RIGHT AXILLARY LYMPH NODE DISSECTION;  Surgeon: Rolm Bookbinder, MD;  Location: [place];  Service: General;  Laterality: Right;  . BREAST LUMPECTOMY WITH AXILLARY LYMPH NODE BIOPSY Right 04/23/2017  . BREAST RECONSTRUCTION WITH PLACEMENT OF TISSUE EXPANDER AND FLEX HD (ACELLULAR HYDRATED DERMIS) Bilateral 04/07/2017   Procedure: BILATERAL BREAST RECONSTRUCTION WITH PLACEMENT OF TISSUE EXPANDER AND FLEX HD (ACELLULAR HYDRATED DERMIS);  Surgeon: Crissie Reese, MD;  Location: [place];  Service: Plastics;  Laterality: Bilateral;  . IR GENERIC HISTORICAL  01/10/2017   IR CV LINE INJECTION 01/10/2017 Greggory Keen, MD [facility]-INTER[facility] RAD  . MASTECTOMY    . MASTECTOMY WITH RADIOACTIVE SEED GUIDED EXCISION AND AXILLARY SENTINEL LYMPH NODE BIOPSY Bilateral 04/07/2017   Procedure: RIGHT SKIN SPARING MASTECTOMY WITH RADIOACTIVE SEED GUIDED AXILLARY LYMPH NODE EXCISION AND AXILLARY SENTINEL LYMPH NODE BIOPSY, LEFT PROPHYLACTIC SKIN SPARING MASTECTOMY;  Surgeon: Rolm Bookbinder, MD;  Location: [place];  Service: General;  Laterality: Bilateral;  . PORT-A-CATH REMOVAL N/A 04/07/2017   Procedure: REMOVAL PORT-A-CATH;  Surgeon: Rolm Bookbinder, MD;  Location: [place];  Service: General;  Laterality: N/A;  . PORTACATH PLACEMENT Right 09/23/2016   Procedure: INSERTION PORT-A-CATH WITH [place];  Surgeon: Rolm Bookbinder, MD;  Location: [place];  Service: General;  Laterality: Right;  . TONSILECTOMY/ADENOIDECTOMY WITH MYRINGOTOMY    . TUBAL LIGATION      I have reviewed the social history and family history with the patient  and they are unchanged from previous note.  ALLERGIES:  is allergic to banana, prednisone, sulfur, and tramadol.  MEDICATIONS:  Current Outpatient Medications  Medication Sig Dispense Refill  . diphenhydrAMINE (BENADRYL) 50  MG tablet Take 50 mg by mouth at bedtime as needed for itching.    . Folic Acid-Vit W4-XLK G40 (FOLBEE) 2.5-25-1 MG TABS tablet Take 1 tablet by mouth daily.    Marland Kitchen ibuprofen (ADVIL,MOTRIN) 200 MG tablet Take 800 mg by mouth every 6 (six) hours as needed for headache, mild pain or moderate pain.    . tamoxifen (NOLVADEX) 20 MG tablet Take 1 tablet (20 mg total) by mouth daily. 30 tablet 3   No current facility-administered medications for this visit.   Facility-Administered Medications Ordered in Other Visits  Medication Dose Route Frequency Provider Last Rate Last Admin  . sodium chloride (PF) 0.9 % injection             PHYSICAL EXAMINATION: ECOG PERFORMANCE STATUS: 1 - Symptomatic but completely ambulatory  Vitals:   05/25/20 1132  BP: 123/76  Pulse: 97  Resp: 17  Temp: 98 F (36.7 C)  SpO2: 98%   Filed Weights   05/25/20 1132  Weight: 170 lb (77.1 kg)    Due to COVID19 we will limit examination to appearance. Patient had no complaints.  GENERAL:alert, no distress and comfortable SKIN: skin color normal, no rashes or significant lesions EYES: normal, Conjunctiva are pink and non-injected, sclera clear  NEURO: alert & oriented x 3 with fluent speech   LABORATORY DATA:  I have reviewed the data as listed CBC Latest Ref Rng & Units 05/25/2020 02/22/2020 12/06/2019  WBC 4.0 - 10.5 K/uL 4.9 5.4 9.1  Hemoglobin 12.0 - 15.0 g/dL 13.8 12.6 13.4  Hematocrit 36 - 46 % 41.5 39.4 41.1  Platelets 150 - 400 K/uL 223 246 278     CMP Latest Ref Rng & Units 05/25/2020 02/22/2020 10/11/2019  Glucose 70 - 99 mg/dL 109(H) 94 109(H)  BUN 6 - 20 mg/dL _0 Creatinine 0.44 - 1.00 mg/dL 0.74 0.71 0.76  Sodium 135 - 145 mmol/L 138 139 141  Potassium 3.5 - 5.1 mmol/L 3.5 4.1 3.5  Chloride 98 - 111 mmol/L 101 105 102  CO2 22 - 32 mmol/L _1 Calcium 8.9 - 10.3 mg/dL 9.3 9.2 9.3  Total Protein 6.5 - 8.1 g/dL 8.3(H) 8.1 8.3(H)  Total Bilirubin 0.3 - 1.2 mg/dL 0.3 <0.2(L) 0.2(L)    Alkaline Phos 38 - 126 U/L 79 86 101  AST 15 - 41 U/L _2 ALT 0 - 44 U/L _3 RADIOGRAPHIC STUDIES: I have personally reviewed the radiological images as listed and agreed with the findings in the report. CT Chest W Contrast  Result Date: 05/25/2020 CLINICAL DATA:  Follow-up enlarged and hypermetabolic internal mammary lymph nodes, history of breast cancer EXAM: CT CHEST WITH CONTRAST TECHNIQUE: Multidetector CT imaging of the chest was performed during intravenous contrast administration. CONTRAST:  77m OMNIPAQUE IOHEXOL 300 MG/ML  SOLN COMPARISON:  CT chest, 02/22/2020, PET-CT, 11/17/2019 FINDINGS: Cardiovascular: Aortic atherosclerosis. Normal heart size. Scattered left coronary artery calcifications. No pericardial effusion. Mediastinum/Nodes: Unchanged prominent left internal mammary lymph nodes, the largest index node measuring 1.0 x 1.0 cm (series 2, image 49). Unchanged prominent anterior mediastinal lymph nodes, measuring up to 1.0 x 0.7 cm (series 2, image 43). Thyroid gland, trachea, and esophagus demonstrate no significant findings. Lungs/Pleura: Mild to moderate  paraseptal emphysema. Unchanged subpleural radiation fibrosis of the anterior right upper lobe. No pleural effusion or pneumothorax. Upper Abdomen: No acute abnormality.  Hepatic steatosis. Musculoskeletal: Status post bilateral mastectomy. Left breast subpectoral saline spacer. IMPRESSION: 1. Unchanged prominent left internal mammary and anterior mediastinal lymph nodes. Despite stability, these remain generally suspicious for metastatic disease given distribution and previous FDG PET avidity. No new lymphadenopathy or new findings suspicious for metastatic disease in the chest. 2. Unchanged subpleural radiation fibrosis of the anterior right upper lobe. 3. Emphysema (ICD10-J43.9). 4. Coronary artery disease. Aortic Atherosclerosis (ICD10-I70.0). 5. Hepatic steatosis. Electronically Signed   By: Eddie Candle M.D.    On: 05/25/2020 11:41     ASSESSMENT & PLAN:  ANGELYSE HESLIN is a 54 y.o. female with    1. 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 She is s/p neoadjuvant chemo with AC-T, B/l mastectomy and right axillary LN dissection and adjuvant radiation.  -Shehas beenon antiestrogen therapy with Tamoxifensince 01/2017,but not very compliant due to poor tolerance. She has stopped on her ownin 8/2020due to severe arthralgia and hot flashes. She tried Exemestane in 09/2019 for 1 week but switched back to Tamoxifen 1 week later.I discussed given her joint pain from Tamoxifen, she will likely not tolerate Anastrozole or Letrozole.  -Her PETfrom 12/23/20shows0.8 cm left internal mammary lymph node has a maximum SUV of 5.1, most compatible with malignancy,no also hypermetabolic lesion. This islikely distant metastasis then local recurrence since her initial breast cancer was on right.No evidence of otherdistant metastasis.  -she underwent left internal mammary LN biopsy twice in Jan 2021 which showed atypical cell first time and normal lymphoid tissue on 2nd biopsy.  No evidence of malignancy.  Radiation oncology was consulted and treatment not offered due to the negative biopsy. -I personally reviewed and discussed her CT Chest from 05/25/20 with pt which shows stable left internal mammary and anterior mediastinal lymph nodes. No new lymphadenopathy or new findings suspicious for metastatic disease in the chest. Scan also shows emphysema and CAD.  -I recommend continuing to monitor LNs with repeat CT Chest in 6 months. She is agreeable.  -She plans to proceed with breast [facility]. She will send me surgeons name for me to copy my note.  -She has been stable with fatigue and joint pain from Tamoxifen. Manageable so far. Labs reviewed, CBC and CMP WNL except BG 109,  protein 8.3.  -Continue surveillance. She is s/p b/l mastectomy, no need for mammograms.  -Continue Tamoxifen. I encouraged her to monitor her cholesterol with a PCP or her Gyn.   -Patient previously tried exemestane, could not tolerate.  She is not interested in trying other AI -F/u in 3 months    2. Hot flash and night sweats, Arthritis  -Secondary to menopause and Antiestrogen therapy  -Effexor was not effective.   -Manageable on Tamoxifen   3. Anxiety and insomnia -Overall improved since she completed major breast cancer treatment -We discussed dependence from Ambien, she has weaned off.  -She only takes Benadryl to help her sleep.   4. Neuropathy, G1 -Chemotherapy induced.  -Numbness and tingling of her fingers and toes, overall improved. She is no longer on Gabapentin.   5. Genetic Testing was negative    Plan -CT chest reviewed, Stable  -Lab and F/u in 3 months, plan to repeat CT scan in 6 months  -Continue Tamoxifen   No  problem-specific Assessment & Plan notes found for this encounter.   No orders of the defined types were placed in this encounter.  All questions were answered. The patient knows to call the clinic with any problems, questions or concerns. No barriers to learning was detected. The total time spent in the appointment was 30 minutes.     Truitt Merle, MD 05/25/2020   I, Joslyn Devon, am acting as scribe for Truitt Merle, MD.   I have reviewed the above documentation for accuracy and completeness, and I agree with the above.

## 2020-05-25 ENCOUNTER — Other Ambulatory Visit: Payer: Self-pay

## 2020-05-25 ENCOUNTER — Inpatient Hospital Stay: Payer: BC Managed Care – PPO

## 2020-05-25 ENCOUNTER — Encounter: Payer: Self-pay | Admitting: Hematology

## 2020-05-25 ENCOUNTER — Inpatient Hospital Stay: Payer: BC Managed Care – PPO | Attending: Hematology | Admitting: Hematology

## 2020-05-25 ENCOUNTER — Ambulatory Visit (HOSPITAL_COMMUNITY)
Admission: RE | Admit: 2020-05-25 | Discharge: 2020-05-25 | Disposition: A | Discharge status: Home / Self Care | Payer: BC Managed Care – PPO | Source: Ambulatory Visit | Attending: Hematology | Admitting: Hematology

## 2020-05-25 ENCOUNTER — Encounter (HOSPITAL_COMMUNITY): Payer: Self-pay

## 2020-05-25 VITALS — BP 123/76 | HR 97 | Temp 98.0°F | Resp 17 | Ht 64.0 in | Wt 170.0 lb

## 2020-05-25 DIAGNOSIS — C50511 Malignant neoplasm of lower-outer quadrant of right female breast: Secondary | ICD-10-CM

## 2020-05-25 DIAGNOSIS — Z9013 Acquired absence of bilateral breasts and nipples: Secondary | ICD-10-CM | POA: Insufficient documentation

## 2020-05-25 DIAGNOSIS — F419 Anxiety disorder, unspecified: Secondary | ICD-10-CM | POA: Insufficient documentation

## 2020-05-25 DIAGNOSIS — I2510 Atherosclerotic heart disease of native coronary artery without angina pectoris: Secondary | ICD-10-CM | POA: Insufficient documentation

## 2020-05-25 DIAGNOSIS — Z170 Estrogen receptor positive status [ER+]: Secondary | ICD-10-CM | POA: Diagnosis not present

## 2020-05-25 DIAGNOSIS — G4700 Insomnia, unspecified: Secondary | ICD-10-CM

## 2020-05-25 DIAGNOSIS — Z923 Personal history of irradiation: Secondary | ICD-10-CM | POA: Diagnosis not present

## 2020-05-25 DIAGNOSIS — Z79899 Other long term (current) drug therapy: Secondary | ICD-10-CM | POA: Insufficient documentation

## 2020-05-25 DIAGNOSIS — C773 Secondary and unspecified malignant neoplasm of axilla and upper limb lymph nodes: Secondary | ICD-10-CM | POA: Insufficient documentation

## 2020-05-25 DIAGNOSIS — I700 Atherosclerosis of aorta: Secondary | ICD-10-CM | POA: Diagnosis not present

## 2020-05-25 DIAGNOSIS — G620 Drug-induced polyneuropathy: Secondary | ICD-10-CM | POA: Diagnosis not present

## 2020-05-25 DIAGNOSIS — Z79810 Long term (current) use of selective estrogen receptor modulators (SERMs): Secondary | ICD-10-CM | POA: Diagnosis not present

## 2020-05-25 DIAGNOSIS — J439 Emphysema, unspecified: Secondary | ICD-10-CM | POA: Diagnosis not present

## 2020-05-25 DIAGNOSIS — Z791 Long term (current) use of non-steroidal anti-inflammatories (NSAID): Secondary | ICD-10-CM | POA: Insufficient documentation

## 2020-05-25 DIAGNOSIS — J8410 Pulmonary fibrosis, unspecified: Secondary | ICD-10-CM | POA: Diagnosis not present

## 2020-05-25 DIAGNOSIS — Z9221 Personal history of antineoplastic chemotherapy: Secondary | ICD-10-CM | POA: Diagnosis not present

## 2020-05-25 LAB — COMPREHENSIVE METABOLIC PANEL
ALT: 20 U/L (ref 0–44)
AST: 23 U/L (ref 15–41)
Albumin: 4 g/dL (ref 3.5–5.0)
Alkaline Phosphatase: 79 U/L (ref 38–126)
Anion gap: 10 (ref 5–15)
BUN: 12 mg/dL (ref 6–20)
CO2: 27 mmol/L (ref 22–32)
Calcium: 9.3 mg/dL (ref 8.9–10.3)
Chloride: 101 mmol/L (ref 98–111)
Creatinine, Ser: 0.74 mg/dL (ref 0.44–1.00)
GFR calc Af Amer: >60 mL/min (ref >60)
GFR calc non Af Amer: >60 mL/min (ref >60)
Glucose, Bld: 109 mg/dL — ABNORMAL HIGH (ref 70–99)
Potassium: 3.5 mmol/L (ref 3.5–5.1)
Sodium: 138 mmol/L (ref 135–145)
Total Bilirubin: 0.3 mg/dL (ref 0.3–1.2)
Total Protein: 8.3 g/dL — ABNORMAL HIGH (ref 6.5–8.1)

## 2020-05-25 LAB — CBC WITH DIFFERENTIAL/PLATELET
Abs Immature Granulocytes: 0.01 10*3/uL (ref 0.00–0.07)
Basophils Absolute: 0 10*3/uL (ref 0.0–0.1)
Basophils Relative: 0 %
Eosinophils Absolute: 0.1 10*3/uL (ref 0.0–0.5)
Eosinophils Relative: 1 %
HCT: 41.5 % (ref 36.0–46.0)
Hemoglobin: 13.8 g/dL (ref 12.0–15.0)
Immature Granulocytes: 0 %
Lymphocytes Relative: 29 %
Lymphs Abs: 1.4 10*3/uL (ref 0.7–4.0)
MCH: 29.7 pg (ref 26.0–34.0)
MCHC: 33.3 g/dL (ref 30.0–36.0)
MCV: 89.2 fL (ref 80.0–100.0)
Monocytes Absolute: 0.3 10*3/uL (ref 0.1–1.0)
Monocytes Relative: 7 %
Neutro Abs: 3.1 10*3/uL (ref 1.7–7.7)
Neutrophils Relative %: 63 %
Platelets: 223 10*3/uL (ref 150–400)
RBC: 4.65 MIL/uL (ref 3.87–5.11)
RDW: 14 % (ref 11.5–15.5)
WBC: 4.9 10*3/uL (ref 4.0–10.5)
nRBC: 0 % (ref 0.0–0.2)

## 2020-05-25 MED ORDER — IOHEXOL 300 MG/ML  SOLN
75.0000 mL | Freq: Once | INTRAMUSCULAR | Status: AC | PRN
  Administered 2020-05-25: 75 mL via INTRAVENOUS

## 2020-05-25 MED ORDER — SODIUM CHLORIDE (PF) 0.9 % IJ SOLN
INTRAMUSCULAR | Status: AC
  Filled 2020-05-25: qty 50

## 2020-05-26 ENCOUNTER — Telehealth: Payer: Self-pay | Admitting: Hematology

## 2020-05-26 NOTE — Telephone Encounter (Signed)
Unable to schedule next F/U per 7/1 los. Pt stated she will give office a call when pt is ready to schedule.

## 2020-05-30 ENCOUNTER — Inpatient Hospital Stay: Payer: BC Managed Care – PPO

## 2020-05-31 ENCOUNTER — Ambulatory Visit: Payer: BC Managed Care – PPO | Admitting: Hematology

## 2020-06-02 ENCOUNTER — Inpatient Hospital Stay: Payer: BC Managed Care – PPO | Admitting: Hematology

## 2023-08-05 ENCOUNTER — Other Ambulatory Visit: Payer: Self-pay | Admitting: Pharmacist
